# Patient Record
Sex: Male | Born: 1965 | Race: White | Hispanic: No | Marital: Married | Smoking: Never smoker
Health system: Southern US, Community
[De-identification: ages and names within clinical notes are randomized; demographics above are authoritative.]

## PROBLEM LIST (undated history)

## (undated) DIAGNOSIS — IMO0002 Reserved for concepts with insufficient information to code with codable children: Secondary | ICD-10-CM

## (undated) DIAGNOSIS — G4700 Insomnia, unspecified: Secondary | ICD-10-CM

## (undated) DIAGNOSIS — F419 Anxiety disorder, unspecified: Secondary | ICD-10-CM

## (undated) DIAGNOSIS — R0981 Nasal congestion: Secondary | ICD-10-CM

## (undated) DIAGNOSIS — E785 Hyperlipidemia, unspecified: Secondary | ICD-10-CM

## (undated) DIAGNOSIS — G8929 Other chronic pain: Secondary | ICD-10-CM

## (undated) DIAGNOSIS — I10 Essential (primary) hypertension: Secondary | ICD-10-CM

## (undated) HISTORY — DX: Insomnia, unspecified: G47.00

## (undated) HISTORY — DX: Hyperlipidemia, unspecified: E78.5

## (undated) HISTORY — PX: OTHER SURGICAL HISTORY: SHX169

---

## 2002-06-28 HISTORY — PX: CERVICAL FUSION: SHX112

## 2003-01-18 ENCOUNTER — Encounter: Payer: Self-pay | Admitting: Neurosurgery

## 2003-01-18 ENCOUNTER — Ambulatory Visit (HOSPITAL_COMMUNITY): Admission: RE | Admit: 2003-01-18 | Discharge: 2003-01-19 | Payer: Self-pay | Admitting: Neurosurgery

## 2004-10-31 ENCOUNTER — Inpatient Hospital Stay: Payer: Self-pay | Admitting: Internal Medicine

## 2004-10-31 ENCOUNTER — Other Ambulatory Visit: Payer: Self-pay

## 2004-11-04 ENCOUNTER — Ambulatory Visit: Payer: Self-pay | Admitting: General Surgery

## 2005-03-08 ENCOUNTER — Emergency Department: Payer: Self-pay | Admitting: Emergency Medicine

## 2005-03-08 ENCOUNTER — Other Ambulatory Visit: Payer: Self-pay

## 2005-06-28 HISTORY — PX: CHOLECYSTECTOMY: SHX55

## 2013-01-11 ENCOUNTER — Other Ambulatory Visit: Payer: Self-pay | Admitting: Orthopedic Surgery

## 2013-01-22 ENCOUNTER — Encounter (HOSPITAL_BASED_OUTPATIENT_CLINIC_OR_DEPARTMENT_OTHER): Payer: Self-pay | Admitting: *Deleted

## 2013-01-22 NOTE — Progress Notes (Signed)
Pt to come in for bmet-ekg-had ekg with cerv fusion 04 He is very hoarse, but states this is normal from sinus drainage. Uses afrin Never smoker-

## 2013-01-25 ENCOUNTER — Encounter (HOSPITAL_BASED_OUTPATIENT_CLINIC_OR_DEPARTMENT_OTHER)
Admission: RE | Admit: 2013-01-25 | Discharge: 2013-01-25 | Disposition: A | Discharge status: Home / Self Care | Payer: Self-pay | Source: Ambulatory Visit | Attending: Orthopedic Surgery | Admitting: Orthopedic Surgery

## 2013-01-25 ENCOUNTER — Other Ambulatory Visit: Payer: Self-pay

## 2013-01-25 DIAGNOSIS — I10 Essential (primary) hypertension: Secondary | ICD-10-CM | POA: Insufficient documentation

## 2013-01-25 DIAGNOSIS — Z01810 Encounter for preprocedural cardiovascular examination: Secondary | ICD-10-CM | POA: Insufficient documentation

## 2013-01-25 DIAGNOSIS — Z01812 Encounter for preprocedural laboratory examination: Secondary | ICD-10-CM | POA: Insufficient documentation

## 2013-01-25 LAB — BASIC METABOLIC PANEL
CO2: 25 mEq/L (ref 19–32)
Chloride: 104 mEq/L (ref 96–112)
Creatinine, Ser: 1 mg/dL (ref 0.50–1.35)
Glucose, Bld: 153 mg/dL — ABNORMAL HIGH (ref 70–99)

## 2013-01-25 NOTE — Progress Notes (Signed)
EKG reviewed by Dr. Justin Mend- ok for surgery here

## 2013-01-29 ENCOUNTER — Ambulatory Visit (HOSPITAL_BASED_OUTPATIENT_CLINIC_OR_DEPARTMENT_OTHER)
Admission: RE | Admit: 2013-01-29 | Discharge: 2013-01-29 | Disposition: A | Discharge status: Home / Self Care | Payer: Worker's Compensation | Source: Ambulatory Visit | Attending: Orthopedic Surgery | Admitting: Orthopedic Surgery

## 2013-01-29 ENCOUNTER — Encounter (HOSPITAL_BASED_OUTPATIENT_CLINIC_OR_DEPARTMENT_OTHER): Payer: Self-pay

## 2013-01-29 ENCOUNTER — Encounter (HOSPITAL_BASED_OUTPATIENT_CLINIC_OR_DEPARTMENT_OTHER)
Admission: RE | Disposition: A | Discharge status: Home / Self Care | Payer: Self-pay | Source: Ambulatory Visit | Attending: Orthopedic Surgery

## 2013-01-29 ENCOUNTER — Encounter (HOSPITAL_BASED_OUTPATIENT_CLINIC_OR_DEPARTMENT_OTHER): Payer: Self-pay | Admitting: Anesthesiology

## 2013-01-29 ENCOUNTER — Ambulatory Visit (HOSPITAL_BASED_OUTPATIENT_CLINIC_OR_DEPARTMENT_OTHER): Payer: Worker's Compensation | Admitting: Anesthesiology

## 2013-01-29 DIAGNOSIS — Z981 Arthrodesis status: Secondary | ICD-10-CM | POA: Insufficient documentation

## 2013-01-29 DIAGNOSIS — Z91038 Other insect allergy status: Secondary | ICD-10-CM | POA: Insufficient documentation

## 2013-01-29 DIAGNOSIS — M19019 Primary osteoarthritis, unspecified shoulder: Secondary | ICD-10-CM | POA: Insufficient documentation

## 2013-01-29 DIAGNOSIS — I10 Essential (primary) hypertension: Secondary | ICD-10-CM | POA: Insufficient documentation

## 2013-01-29 DIAGNOSIS — Z9889 Other specified postprocedural states: Secondary | ICD-10-CM

## 2013-01-29 DIAGNOSIS — X58XXXA Exposure to other specified factors, initial encounter: Secondary | ICD-10-CM | POA: Insufficient documentation

## 2013-01-29 DIAGNOSIS — IMO0002 Reserved for concepts with insufficient information to code with codable children: Secondary | ICD-10-CM | POA: Insufficient documentation

## 2013-01-29 DIAGNOSIS — Y990 Civilian activity done for income or pay: Secondary | ICD-10-CM | POA: Insufficient documentation

## 2013-01-29 DIAGNOSIS — H6690 Otitis media, unspecified, unspecified ear: Secondary | ICD-10-CM | POA: Insufficient documentation

## 2013-01-29 DIAGNOSIS — J3489 Other specified disorders of nose and nasal sinuses: Secondary | ICD-10-CM | POA: Insufficient documentation

## 2013-01-29 DIAGNOSIS — F411 Generalized anxiety disorder: Secondary | ICD-10-CM | POA: Insufficient documentation

## 2013-01-29 DIAGNOSIS — Z79899 Other long term (current) drug therapy: Secondary | ICD-10-CM | POA: Insufficient documentation

## 2013-01-29 DIAGNOSIS — Y9269 Other specified industrial and construction area as the place of occurrence of the external cause: Secondary | ICD-10-CM | POA: Insufficient documentation

## 2013-01-29 DIAGNOSIS — M25819 Other specified joint disorders, unspecified shoulder: Secondary | ICD-10-CM | POA: Insufficient documentation

## 2013-01-29 DIAGNOSIS — S43429A Sprain of unspecified rotator cuff capsule, initial encounter: Secondary | ICD-10-CM | POA: Insufficient documentation

## 2013-01-29 HISTORY — DX: Essential (primary) hypertension: I10

## 2013-01-29 HISTORY — DX: Nasal congestion: R09.81

## 2013-01-29 HISTORY — DX: Reserved for concepts with insufficient information to code with codable children: IMO0002

## 2013-01-29 HISTORY — DX: Other chronic pain: G89.29

## 2013-01-29 HISTORY — PX: SHOULDER ARTHROSCOPY WITH ROTATOR CUFF REPAIR AND SUBACROMIAL DECOMPRESSION: SHX5686

## 2013-01-29 HISTORY — DX: Anxiety disorder, unspecified: F41.9

## 2013-01-29 SURGERY — SHOULDER ARTHROSCOPY WITH ROTATOR CUFF REPAIR AND SUBACROMIAL DECOMPRESSION
Anesthesia: General | Site: Shoulder | Laterality: Left | Wound class: Clean

## 2013-01-29 MED ORDER — LACTATED RINGERS IV SOLN
INTRAVENOUS | Status: DC
  Administered 2013-01-29 (×2): via INTRAVENOUS
  Administered 2013-01-29: 10 mL/h via INTRAVENOUS

## 2013-01-29 MED ORDER — SUCCINYLCHOLINE CHLORIDE 20 MG/ML IJ SOLN
INTRAMUSCULAR | Status: DC | PRN
  Administered 2013-01-29: 100 mg via INTRAVENOUS

## 2013-01-29 MED ORDER — POVIDONE-IODINE 7.5 % EX SOLN: Freq: Once | CUTANEOUS | Status: DC

## 2013-01-29 MED ORDER — OXYCODONE-ACETAMINOPHEN 10-325 MG PO TABS: 1.0000 | ORAL_TABLET | ORAL | Status: DC | PRN

## 2013-01-29 MED ORDER — ONDANSETRON HCL 4 MG/2ML IJ SOLN: 4.0000 mg | Freq: Once | INTRAMUSCULAR | Status: DC | PRN

## 2013-01-29 MED ORDER — FENTANYL CITRATE 0.05 MG/ML IJ SOLN
INTRAMUSCULAR | Status: DC | PRN
  Administered 2013-01-29 (×2): 25 ug via INTRAVENOUS

## 2013-01-29 MED ORDER — OXYCODONE HCL 5 MG/5ML PO SOLN: 5.0000 mg | Freq: Once | ORAL | Status: AC | PRN

## 2013-01-29 MED ORDER — CEFAZOLIN SODIUM-DEXTROSE 2-3 GM-% IV SOLR
2.0000 g | INTRAVENOUS | Status: AC
  Administered 2013-01-29: 2 g via INTRAVENOUS

## 2013-01-29 MED ORDER — OXYCODONE-ACETAMINOPHEN 10-325 MG PO TABS: 1.0000 | ORAL_TABLET | Freq: Four times a day (QID) | ORAL | Status: DC | PRN

## 2013-01-29 MED ORDER — OXYCODONE HCL 5 MG PO TABS: ORAL_TABLET | ORAL | Status: DC

## 2013-01-29 MED ORDER — LIDOCAINE HCL (CARDIAC) 10 MG/ML IV SOLN
INTRAVENOUS | Status: DC | PRN
  Administered 2013-01-29: 60 mg via INTRAVENOUS

## 2013-01-29 MED ORDER — HYDROMORPHONE HCL PF 1 MG/ML IJ SOLN: 0.2500 mg | INTRAMUSCULAR | Status: DC | PRN

## 2013-01-29 MED ORDER — PROPOFOL 10 MG/ML IV BOLUS
INTRAVENOUS | Status: DC | PRN
  Administered 2013-01-29: 250 mg via INTRAVENOUS

## 2013-01-29 MED ORDER — SODIUM CHLORIDE 0.9 % IR SOLN
Status: DC | PRN
  Administered 2013-01-29: 15000 mL

## 2013-01-29 MED ORDER — DEXAMETHASONE SODIUM PHOSPHATE 4 MG/ML IJ SOLN
INTRAMUSCULAR | Status: DC | PRN
  Administered 2013-01-29: 4 mg
  Administered 2013-01-29: 10 mg via INTRAVENOUS

## 2013-01-29 MED ORDER — MIDAZOLAM HCL 2 MG/2ML IJ SOLN
1.0000 mg | INTRAMUSCULAR | Status: DC | PRN
  Administered 2013-01-29: 2 mg via INTRAVENOUS

## 2013-01-29 MED ORDER — ONDANSETRON HCL 4 MG/2ML IJ SOLN
INTRAMUSCULAR | Status: DC | PRN
  Administered 2013-01-29: 4 mg via INTRAVENOUS

## 2013-01-29 MED ORDER — OXYCODONE HCL 5 MG PO TABS
5.0000 mg | ORAL_TABLET | Freq: Once | ORAL | Status: AC | PRN
  Administered 2013-01-29: 5 mg via ORAL

## 2013-01-29 MED ORDER — FENTANYL CITRATE 0.05 MG/ML IJ SOLN
50.0000 ug | INTRAMUSCULAR | Status: DC | PRN
  Administered 2013-01-29: 200 ug via INTRAVENOUS

## 2013-01-29 MED ORDER — SCOPOLAMINE 1 MG/3DAYS TD PT72
1.0000 | MEDICATED_PATCH | TRANSDERMAL | Status: DC
Start: 2013-01-29 — End: 2013-01-29
  Administered 2013-01-29: 1.5 mg via TRANSDERMAL

## 2013-01-29 MED ORDER — BUPIVACAINE-EPINEPHRINE PF 0.5-1:200000 % IJ SOLN
INTRAMUSCULAR | Status: DC | PRN
  Administered 2013-01-29: 20 mL

## 2013-01-29 SURGICAL SUPPLY — 82 items
ANCHOR CORKSCREW FIBER 5.5X15 (Anchor) ×2 IMPLANT
ANCHOR PEEK 4.75X19.1 SWLK C (Anchor) ×2 IMPLANT
BENZOIN TINCTURE PRP APPL 2/3 (GAUZE/BANDAGES/DRESSINGS) IMPLANT
BLADE SURG 15 STRL LF DISP TIS (BLADE) IMPLANT
BLADE SURG 15 STRL SS (BLADE)
BLADE SURG ROTATE 9660 (MISCELLANEOUS) IMPLANT
BUR OVAL 4.0 (BURR) ×2 IMPLANT
CANISTER OMNI JUG 16 LITER (MISCELLANEOUS) ×2 IMPLANT
CANISTER SUCTION 2500CC (MISCELLANEOUS) IMPLANT
CANNULA 5.75X71 LONG (CANNULA) ×2 IMPLANT
CANNULA TWIST IN 8.25X7CM (CANNULA) ×2 IMPLANT
CHLORAPREP W/TINT 26ML (MISCELLANEOUS) ×2 IMPLANT
CLOTH BEACON ORANGE TIMEOUT ST (SAFETY) ×2 IMPLANT
DECANTER SPIKE VIAL GLASS SM (MISCELLANEOUS) IMPLANT
DERMABOND ADVANCED (GAUZE/BANDAGES/DRESSINGS)
DERMABOND ADVANCED .7 DNX12 (GAUZE/BANDAGES/DRESSINGS) IMPLANT
DRAPE INCISE IOBAN 66X45 STRL (DRAPES) ×2 IMPLANT
DRAPE STERI 35X30 U-POUCH (DRAPES) ×2 IMPLANT
DRAPE SURG 17X23 STRL (DRAPES) ×2 IMPLANT
DRAPE U 20/CS (DRAPES) ×2 IMPLANT
DRAPE U-SHAPE 47X51 STRL (DRAPES) ×2 IMPLANT
DRAPE U-SHAPE 76X120 STRL (DRAPES) ×4 IMPLANT
DRSG PAD ABDOMINAL 8X10 ST (GAUZE/BANDAGES/DRESSINGS) ×2 IMPLANT
ELECT REM PT RETURN 9FT ADLT (ELECTROSURGICAL) ×2
ELECTRODE REM PT RTRN 9FT ADLT (ELECTROSURGICAL) ×1 IMPLANT
GAUZE SPONGE 4X4 16PLY XRAY LF (GAUZE/BANDAGES/DRESSINGS) IMPLANT
GAUZE XEROFORM 1X8 LF (GAUZE/BANDAGES/DRESSINGS) ×2 IMPLANT
GLOVE BIO SURGEON STRL SZ7 (GLOVE) ×2 IMPLANT
GLOVE BIO SURGEON STRL SZ7.5 (GLOVE) ×2 IMPLANT
GLOVE BIOGEL PI IND STRL 7.0 (GLOVE) ×2 IMPLANT
GLOVE BIOGEL PI IND STRL 8 (GLOVE) IMPLANT
GLOVE BIOGEL PI INDICATOR 7.0 (GLOVE) ×2
GLOVE BIOGEL PI INDICATOR 8 (GLOVE)
GLOVE ECLIPSE 6.5 STRL STRAW (GLOVE) ×2 IMPLANT
GOWN PREVENTION PLUS XLARGE (GOWN DISPOSABLE) ×4 IMPLANT
GOWN PREVENTION PLUS XXLARGE (GOWN DISPOSABLE) ×2 IMPLANT
LASSO CRESCENT QUICKPASS (SUTURE) ×2 IMPLANT
NDL SUT 6 .5 CRC .975X.05 MAYO (NEEDLE) IMPLANT
NEEDLE 1/2 CIR CATGUT .05X1.09 (NEEDLE) IMPLANT
NEEDLE MAYO TAPER (NEEDLE)
NEEDLE SCORPION MULTI FIRE (NEEDLE) ×2 IMPLANT
NS IRRIG 1000ML POUR BTL (IV SOLUTION) IMPLANT
PACK ARTHROSCOPY DSU (CUSTOM PROCEDURE TRAY) ×2 IMPLANT
PACK BASIN DAY SURGERY FS (CUSTOM PROCEDURE TRAY) ×2 IMPLANT
PENCIL BUTTON HOLSTER BLD 10FT (ELECTRODE) IMPLANT
RESECTOR FULL RADIUS 4.2MM (BLADE) ×2 IMPLANT
SHEET MEDIUM DRAPE 40X70 STRL (DRAPES) ×2 IMPLANT
SLEEVE SCD COMPRESS KNEE MED (MISCELLANEOUS) ×2 IMPLANT
SLING ARM FOAM STRAP LRG (SOFTGOODS) ×2 IMPLANT
SLING ARM FOAM STRAP MED (SOFTGOODS) IMPLANT
SLING ARM FOAM STRAP XLG (SOFTGOODS) IMPLANT
SLING ARM IMMOBILIZER MED (SOFTGOODS) IMPLANT
SPONGE GAUZE 4X4 12PLY (GAUZE/BANDAGES/DRESSINGS) ×2 IMPLANT
SPONGE LAP 4X18 X RAY DECT (DISPOSABLE) IMPLANT
STRIP CLOSURE SKIN 1/2X4 (GAUZE/BANDAGES/DRESSINGS) IMPLANT
SUCTION FRAZIER TIP 10 FR DISP (SUCTIONS) IMPLANT
SUPPORT WRAP ARM LG (MISCELLANEOUS) ×2 IMPLANT
SUT BONE WAX W31G (SUTURE) IMPLANT
SUT ETHIBOND 2 OS 4 DA (SUTURE) IMPLANT
SUT ETHILON 3 0 PS 1 (SUTURE) ×2 IMPLANT
SUT ETHILON 4 0 PS 2 18 (SUTURE) IMPLANT
SUT FIBERWIRE #2 38 T-5 BLUE (SUTURE)
SUT FIBERWIRE 2-0 18 17.9 3/8 (SUTURE)
SUT MNCRL AB 3-0 PS2 18 (SUTURE) IMPLANT
SUT MNCRL AB 4-0 PS2 18 (SUTURE) IMPLANT
SUT PDS AB 0 CT 36 (SUTURE) ×2 IMPLANT
SUT PROLENE 3 0 PS 2 (SUTURE) IMPLANT
SUT VIC AB 0 CT1 27 (SUTURE)
SUT VIC AB 0 CT1 27XBRD ANBCTR (SUTURE) IMPLANT
SUT VIC AB 2-0 SH 27 (SUTURE)
SUT VIC AB 2-0 SH 27XBRD (SUTURE) IMPLANT
SUTURE FIBERWR #2 38 T-5 BLUE (SUTURE) IMPLANT
SUTURE FIBERWR 2-0 18 17.9 3/8 (SUTURE) IMPLANT
SYR BULB 3OZ (MISCELLANEOUS) IMPLANT
TAPE FIBER 2MM 7IN #2 BLUE (SUTURE) ×2 IMPLANT
TOWEL OR 17X24 6PK STRL BLUE (TOWEL DISPOSABLE) ×2 IMPLANT
TOWEL OR NON WOVEN STRL DISP B (DISPOSABLE) ×2 IMPLANT
TUBE CONNECTING 20X1/4 (TUBING) ×2 IMPLANT
TUBING ARTHROSCOPY IRRIG 16FT (MISCELLANEOUS) ×2 IMPLANT
WAND STAR VAC 90 (SURGICAL WAND) ×2 IMPLANT
WATER STERILE IRR 1000ML POUR (IV SOLUTION) ×2 IMPLANT
YANKAUER SUCT BULB TIP NO VENT (SUCTIONS) IMPLANT

## 2013-01-29 NOTE — Anesthesia Preprocedure Evaluation (Signed)
Anesthesia Evaluation  Patient identified by MRN, date of birth, ID band Patient awake    Reviewed: Allergy & Precautions, H&P , NPO status , Patient's Chart, lab work & pertinent test results  Airway Mallampati: I TM Distance: >3 FB Neck ROM: Full    Dental  (+) Teeth Intact and Dental Advisory Given   Pulmonary  breath sounds clear to auscultation        Cardiovascular hypertension, Pt. on medications Rhythm:Regular Rate:Normal     Neuro/Psych    GI/Hepatic   Endo/Other    Renal/GU      Musculoskeletal   Abdominal   Peds  Hematology   Anesthesia Other Findings   Reproductive/Obstetrics                           Anesthesia Physical Anesthesia Plan  ASA: II  Anesthesia Plan: General   Post-op Pain Management:    Induction: Intravenous  Airway Management Planned: Oral ETT  Additional Equipment:   Intra-op Plan:   Post-operative Plan: Extubation in OR  Informed Consent: I have reviewed the patients History and Physical, chart, labs and discussed the procedure including the risks, benefits and alternatives for the proposed anesthesia with the patient or authorized representative who has indicated his/her understanding and acceptance.   Dental advisory given  Plan Discussed with: CRNA, Anesthesiologist and Surgeon  Anesthesia Plan Comments:         Anesthesia Quick Evaluation  

## 2013-01-29 NOTE — Progress Notes (Signed)
Assisted Dr. Crews with left, ultrasound guided, interscalene  block. Side rails up, monitors on throughout procedure. See vital signs in flow sheet. Tolerated Procedure well. 

## 2013-01-29 NOTE — Anesthesia Procedure Notes (Addendum)
Anesthesia Procedure Image  Anesthesia Regional Block:  Interscalene brachial plexus block  Pre-Anesthetic Checklist: ,, timeout performed, Correct Patient, Correct Site, Correct Laterality, Correct Procedure, Correct Position, site marked, Risks and benefits discussed,  Surgical consent,  Pre-op evaluation,  At surgeon's request and post-op pain management  Laterality: Left and Upper  Prep: chloraprep       Needles:  Injection technique: Single-shot  Needle Type: Echogenic Needle     Needle Length: 5cm 5 cm Needle Gauge: 21    Additional Needles:  Procedures: ultrasound guided (picture in chart) Interscalene brachial plexus block Narrative:  Start time: 01/29/2013 10:42 AM End time: 01/29/2013 11:50 AM Injection made incrementally with aspirations every 5 mL.  Performed by: Personally  Anesthesiologist: Sheldon Silvan, MD  Interscalene brachial plexus block Anesthesia Procedure Image  Anesthesia Procedure Image Anesthesia Procedure Image  Anesthesia Procedure Image Anesthesia Procedure Image  Anesthesia Procedure Image  Anesthesia Procedure Image Anesthesia Procedure Image  Anesthesia Procedure Image  Anesthesia Procedure Image Anesthesia Procedure Image  Anesthesia Procedure Image  Anesthesia Procedure Image  Procedure Name: Intubation Date/Time: 01/29/2013 11:47 AM Performed by: Gar Gibbon Pre-anesthesia Checklist: Patient identified, Emergency Drugs available, Suction available and Patient being monitored Patient Re-evaluated:Patient Re-evaluated prior to inductionOxygen Delivery Method: Circle System Utilized Preoxygenation: Pre-oxygenation with 100% oxygen Intubation Type: IV induction Ventilation: Mask ventilation without difficulty Grade View: Grade III Tube type: Oral Tube size: 8.0 mm Number of attempts: 1 Airway Equipment and Method: stylet,  oral airway and Video-laryngoscopy Placement Confirmation: positive ETCO2 and breath sounds checked-  equal and bilateral Secured at: 21 cm Tube secured with: Tape Dental Injury: Teeth and Oropharynx as per pre-operative assessment

## 2013-01-29 NOTE — Transfer of Care (Signed)
Immediate Anesthesia Transfer of Care Note  Patient: Brett Huber  Procedure(s) Performed: Procedure(s): LEFT SHOULDER ARTHROSCOPY WITH ROTATOR CUFF REPAIR AND SUBACROMIAL DECOMPRESSION DISTAL CLAVICLE RESECTION (Left)  Patient Location: PACU  Anesthesia Type:GA combined with regional for post-op pain  Level of Consciousness: sedated and patient cooperative  Airway & Oxygen Therapy: Patient Spontanous Breathing and Patient connected to face mask oxygen  Post-op Assessment: Report given to PACU RN and Post -op Vital signs reviewed and stable  Post vital signs: Reviewed and stable  Complications: No apparent anesthesia complications

## 2013-01-29 NOTE — Op Note (Signed)
Procedure(s): LEFT SHOULDER ARTHROSCOPY WITH ROTATOR CUFF REPAIR AND SUBACROMIAL DECOMPRESSION DISTAL CLAVICLE RESECTION Procedure Note  LOUISE VICTORY male 47 y.o. 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   Surgeon(s) and Role:    * Mable Paris, MD - Primary     Surgeon: Mable Paris   Assistants: Damita Lack PA-C (Danielle was present and scrubbed throughout the procedure and was essential in positioning, assisting with the camera and instrumentation,, and closure)  Anesthesia: General endotracheal anesthesia with preoperative interscalene block    Procedure Detail  LEFT SHOULDER ARTHROSCOPY WITH ROTATOR CUFF REPAIR AND SUBACROMIAL DECOMPRESSION DISTAL CLAVICLE RESECTION  Estimated Blood Loss: Min         Drains: none  Blood Given: none         Specimens: none        Complications:  * No complications entered in OR log *         Disposition: PACU - hemodynamically stable.         Condition: stable    Procedure:   INDICATIONS FOR SURGERY: The patient is 47 y.o. male who had an injury at work 09/12/2012. He failed nonoperative management. An MRI showed high-grade partial-thickness tears. Is indicated for surgical treatment to decrease pain and restore function. He understood risks benefits and alternatives to the procedure including but not limited to risk of stiffness, nonhealing, incomplete pain relief, and the potential need for future surgery.  OPERATIVE FINDINGS: Examination under anesthesia: No stiffness or instability Diagnostic  Arthroscopy:  Glenoid articular cartilage: Intact Humeral head articular cartilage: Intact Labrum: Intact, mild partial tearing of the long head biceps tendon which was debrided. The remainder the tendon looked intact. Loose bodies: None Synovitis: Mild Articular sided rotator cuff: Small full-thickness tear anteriorly Bursal sided rotator cuff: Small full-thickness tear anteriorly Coracoacromial ligament: Severely frayed indicating chronic impingement  DESCRIPTION OF PROCEDURE: The patient was identified in preoperative  holding area where I personally marked the operative site after  verifying site, side, and procedure with the patient. An interscalene block was given by the attending anesthesiologist the holding area.  The patient was taken back to the operating room where general anesthesia was induced without complication and was placed in the beach-chair position with the back  elevated about 60 degrees and all extremities and head and neck carefully padded and  positioned.   The left upper extremity was then prepped and  draped in a standard sterile fashion. The appropriate time-out  procedure was carried out. The patient did receive IV antibiotics  within 30 minutes of incision.   A small posterior portal incision was made and the arthroscope was introduced into the joint. An anterior portal was then established above the subscapularis using needle localization. Small cannula was placed anteriorly. Diagnostic arthroscopy was then carried out with findings as described above.  The biceps tendon was noted to have some partial-thickness tearing. It was pulled into the joint. The tearing is felt to involve less than 10% of the tendon thickness the remainder the tendon looked intact without any significant tenosynovitis. Therefore the shaver was used to debride this. The remainder the intra-articular joint structures look fairly good with the cartilage to be  intact reversible labrum  intact.  The subscapularis was noted to be torn with the superior border retracted medially. A grasper was used to reduce the good thick healthy tendon back to the tuberosity and was easily reducible. Therefore it was felt the repair should be carried out. A large cannula was placed anteriorly with a small cannula placed in the high lateral rotator interval portal position on the biceps. Once the tendon was freed up and mobilized the superior aspect of the lesser tuberosity was debrided down to bleeding bone with the bur. A crescent suture passer was used to pass one fiber tape in an inverted mattress configuration through the upper border of the subscapularis. This was placed in a 4.75 mm swivel lock anchor which was then used to repair the subscapularis back to the lesser tuberosity. The repair was felt to be excellent and reproduced the normal anatomy of the subscapularis. The biceps tendon was not subluxated was left in its normal position.  The supraspinatus was then examined. There is noted to be high grade undersurface tearing. Given the small size of the tear is difficult to tell if it penetrated full-thickness. The undersurface was debrided with the shaver.  The arthroscope was then introduced into the subacromial space a standard lateral portal was established with needle localization. The shaver was used through the lateral portal to perform extensive bursectomy. Coracoacromial ligament was examined and found to be severely frayed indicating chronic impingement.  The bursal surface of the rotator cuff was carefully examined and in the region of the anterior supraspinatus there was a small full-thickness tear which communicated with the articular sided tear previously visualized. Therefore a working cannula was placed laterally and the tendon edge was debrided with a shaver. The tuberosity was prepared with a bur to bleeding bone and repair was carried out in a simple fashion with one 5.5 mm peak  corkscrew anchor centered in the tuberosity. 2 simple sutures were passed with one anterior one posterior. These were used to bring the tendon down over the prepared tuberosity.   The coracoacromial ligament was taken down off the anterior acromion with the ArthroCare exposing a hooked anterior acromial spur. A high-speed bur was then used through the lateral portal to take down the anterior acromial spur from lateral to medial in a standard acromioplasty.  The acromioplasty was also viewed from the lateral portal and the bur was used as necessary to ensure that the acromion was completely flat from posterior to anterior.  The distal clavicle was exposed arthroscopically and the bur was used to take off the undersurface for approximately 8 mm from the lateral portal. The bur was then moved to an anterior portal position to complete the distal clavicle excision resecting about 8 mm of the distal clavicle and a smooth even fashion. Upon viewing from the front superior aspect of the clavicle was not adequately resected. It was very difficult to adequately view this from the lateral portal given his anatomy. Therefore a new visor portal was created while viewing from the anterior portal into the a.c. joint. This allowed direct as well as a shunt of the resection which was then completed with a bur.  The arthroscopic equipment was removed from the joint and the portals were closed with 3-0 nylon in an interrupted fashion. Sterile dressings were then applied including Xeroform 4 x 4's ABDs and tape. The patient was then allowed to awaken from general anesthesia, placed in a sling, transferred to the stretcher and taken to the  recovery room in stable condition.   POSTOPERATIVE PLAN: The patient will be discharged home today and will followup in one week for suture removal and wound check.  He will follow the standard cuff protocol.

## 2013-01-29 NOTE — H&P (Signed)
Brett Huber is an 47 y.o. male.   Chief Complaint: L shoulder pain HPI: s/p injury at work with continued L shoulder pain and dysfunction despite conservative treatment.  Past Medical History  Diagnosis Date  . Hypertension   . Anxiety   . Chronic pain   . DDD (degenerative disc disease)   . Sinus congestion     Past Surgical History  Procedure Laterality Date  . Cholecystectomy  2007  . Cervical fusion  2004    History reviewed. No pertinent family history. Social History:  reports that he has never smoked. He does not have any smokeless tobacco history on file. He reports that  drinks alcohol. He reports that he does not use illicit drugs.  Allergies:  Allergies  Allergen Reactions  . Bee Venom Shortness Of Breath    Medications Prior to Admission  Medication Sig Dispense Refill  . alprazolam (XANAX) 2 MG tablet Take 2 mg by mouth at bedtime as needed for sleep.      Marland Kitchen gabapentin (NEURONTIN) 300 MG capsule Take 300 mg by mouth 3 (three) times daily.      Marland Kitchen olmesartan-hydrochlorothiazide (BENICAR HCT) 20-12.5 MG per tablet Take 1 tablet by mouth daily.      Marland Kitchen oxyCODONE-acetaminophen (PERCOCET) 10-325 MG per tablet Take 1 tablet by mouth every 4 (four) hours as needed for pain (takes about 6 daily).      Marland Kitchen oxymetazoline (AFRIN) 0.05 % nasal spray Place 2 sprays into the nose 2 (two) times daily.        Results for orders placed during the hospital encounter of 01/29/13 (from the past 48 hour(s))  POCT HEMOGLOBIN-HEMACUE     Status: None   Collection Time    01/29/13 10:26 AM      Result Value Range   Hemoglobin 15.5  13.0 - 17.0 g/dL   No results found.  Review of Systems  All other systems reviewed and are negative.    Blood pressure 122/92, pulse 87, temperature 98.3 F (36.8 C), temperature source Oral, resp. rate 18, height 5\' 9"  (1.753 m), weight 80.797 kg (178 lb 2 oz), SpO2 100.00%. Physical Exam  Constitutional: He is oriented to person, place,  and time. He appears well-developed and well-nourished.  HENT:  Head: Atraumatic.  Eyes: EOM are normal.  Cardiovascular: Intact distal pulses.   Respiratory: Effort normal.  Musculoskeletal:  L shoulder TTP at [facility] and RC  Neurological: He is alert and oriented to person, place, and time.  Skin: Skin is warm and dry.  Psychiatric: He has a normal mood and affect.     Assessment/Plan L shoulder partial RCT, impingement, AC DJD Plan arth RCR vs debridement, SAD, DCR Risks / benefits of surgery discussed Consent on chart  NPO for OR Preop antibiotics   Drevion Offord WILLIAM 01/29/2013, 11:28 AM

## 2013-01-29 NOTE — Anesthesia Postprocedure Evaluation (Signed)
  Anesthesia Post-op Note  Patient: Brett Huber  Procedure(s) Performed: Procedure(s): LEFT SHOULDER ARTHROSCOPY WITH ROTATOR CUFF REPAIR AND SUBACROMIAL DECOMPRESSION DISTAL CLAVICLE RESECTION (Left)  Patient Location: PACU  Anesthesia Type:GA combined with regional for post-op pain  Level of Consciousness: awake, alert  and oriented  Airway and Oxygen Therapy: Patient Spontanous Breathing  Post-op Pain: none  Post-op Assessment: Post-op Vital signs reviewed  Post-op Vital Signs: Reviewed  Complications: No apparent anesthesia complications

## 2013-01-31 ENCOUNTER — Encounter (HOSPITAL_BASED_OUTPATIENT_CLINIC_OR_DEPARTMENT_OTHER): Payer: Self-pay | Admitting: Orthopedic Surgery

## 2015-07-23 ENCOUNTER — Ambulatory Visit (INDEPENDENT_AMBULATORY_CARE_PROVIDER_SITE_OTHER): Payer: Managed Care, Other (non HMO) | Admitting: Family Medicine

## 2015-07-23 ENCOUNTER — Encounter: Payer: Self-pay | Admitting: Family Medicine

## 2015-07-23 VITALS — BP 160/94 | HR 96 | Temp 98.8°F | Resp 16 | Ht 69.0 in | Wt 185.0 lb

## 2015-07-23 DIAGNOSIS — I10 Essential (primary) hypertension: Secondary | ICD-10-CM | POA: Diagnosis not present

## 2015-07-23 DIAGNOSIS — G8929 Other chronic pain: Secondary | ICD-10-CM

## 2015-07-23 DIAGNOSIS — Z1211 Encounter for screening for malignant neoplasm of colon: Secondary | ICD-10-CM | POA: Diagnosis not present

## 2015-07-23 DIAGNOSIS — I129 Hypertensive chronic kidney disease with stage 1 through stage 4 chronic kidney disease, or unspecified chronic kidney disease: Secondary | ICD-10-CM | POA: Insufficient documentation

## 2015-07-23 DIAGNOSIS — F419 Anxiety disorder, unspecified: Secondary | ICD-10-CM | POA: Insufficient documentation

## 2015-07-23 DIAGNOSIS — F418 Other specified anxiety disorders: Secondary | ICD-10-CM

## 2015-07-23 DIAGNOSIS — N529 Male erectile dysfunction, unspecified: Secondary | ICD-10-CM

## 2015-07-23 DIAGNOSIS — E559 Vitamin D deficiency, unspecified: Secondary | ICD-10-CM

## 2015-07-23 DIAGNOSIS — E785 Hyperlipidemia, unspecified: Secondary | ICD-10-CM | POA: Insufficient documentation

## 2015-07-23 DIAGNOSIS — G4700 Insomnia, unspecified: Secondary | ICD-10-CM | POA: Diagnosis not present

## 2015-07-23 DIAGNOSIS — F5101 Primary insomnia: Secondary | ICD-10-CM | POA: Insufficient documentation

## 2015-07-23 DIAGNOSIS — F32A Depression, unspecified: Secondary | ICD-10-CM

## 2015-07-23 DIAGNOSIS — F329 Major depressive disorder, single episode, unspecified: Secondary | ICD-10-CM

## 2015-07-23 DIAGNOSIS — N182 Chronic kidney disease, stage 2 (mild): Secondary | ICD-10-CM | POA: Insufficient documentation

## 2015-07-23 MED ORDER — OLMESARTAN MEDOXOMIL-HCTZ 20-12.5 MG PO TABS: 1.0000 | ORAL_TABLET | Freq: Every day | ORAL | Status: DC

## 2015-07-23 MED ORDER — TADALAFIL 20 MG PO TABS: 10.0000 mg | ORAL_TABLET | ORAL | Status: DC | PRN

## 2015-07-23 MED ORDER — SERTRALINE HCL 50 MG PO TABS: 50.0000 mg | ORAL_TABLET | Freq: Every day | ORAL | Status: DC

## 2015-07-23 NOTE — Patient Instructions (Signed)
Let's try Zoloft for sleep and anxiety. Break the first 2 tablets in 1/2 and take 1/2 table for 4 days to reduce side effects.   Also look into Cognitive Behavioral Therapy for sleep: http://www.rodriguez-ball.com/ https://sleepfoundation.org/sleep-news/cognitive-behavioral-therapy-insomnia  Your goal blood pressure is 140/90. Work on low salt/sodium diet - goal <2.5gm (2,500mg ) per day. Eat a diet high in fruits/vegetables and whole grains.  Look into mediterranean and DASH diet. Goal activity is 156min/wk of moderate intensity exercise.  This can be split into 30 minute chunks.  If you are not at this level, you can start with smaller 10-15 min increments and slowly build up activity. Look at www.heart.org for more resources  Please seek immediate medical attention at ER or Urgent Care if you develop: Chest pain, pressure or tightness. Shortness of breath accompanied by nausea or diaphoresis Visual changes Numbness or tingling on one side of the body Facial droop Altered mental status Or any concerning symptoms.

## 2015-07-23 NOTE — Assessment & Plan Note (Signed)
PRN cialis refilled. Pt is aware he must tell EMS he has taken this medication if he experiences chest pain.

## 2015-07-23 NOTE — Assessment & Plan Note (Signed)
Check lipid panel. Consider statin therapy in the future.

## 2015-07-23 NOTE — Assessment & Plan Note (Addendum)
Score 17/27 on PHQ-9. Pt is amenable to starting medication. Start Zoloft to help with mood and sleep. Check TSH and Vitamin D.  Pt has dark thoughts but no active plan to hurt himself. Will go to ER if he feels he is a danger to himself.  Contract for safety made.  Recheck 1 mos.

## 2015-07-23 NOTE — Progress Notes (Signed)
Subjective:    Patient ID: Brett Huber, male    DOB: 06-23-1966, 50 y.o.   MRN: [id number]  HPI: Brett Huber is a 50 y.o. male presenting on 07/23/2015 for Establish Care   HPI  Pt presents to establish care today. Previous care provider was Dr. Audie Box.  It has been 1 year since his last PCP visit. Records from previous provider will be requested and reviewed. Also sees Pain Management in [place]- Dr. Rochele Raring Current medical problems include:  Degenerative disc disease: Sees pain management Insomnia: Has a long issues with no sleep. Taking unisom, melatonin with a little relief. Has tried trazodone. Has tried [place]- felt a zombie. He has trouble shutting his brain off. Under a lot of stress. His wife is in treatment for substance abuse. 2 small children at home. Works 3rd shift.Some dark thoughts but no active suicide plan. More an outlet for stress. Contract for safety made.  High blood pressure: Been on medications in the past- Benicar- off medication for a year.  Hyperlipidemia: Previously on medication. On simvastatin. Did well on medication. No CP, SOB, visual change. Erectile Dysfunction: Able to get but trouble maintaining erection. Has taken cialis with good effect in the past.   Health maintenance:  Last TDAP- 3 years ago. Never had a colonscopy- desires cologuard.    Past Medical History  Diagnosis Date  . Hypertension   . Anxiety   . Chronic pain   . DDD (degenerative disc disease)   . Sinus congestion   . Hyperlipidemia   . Insomnia    Social History   Social History  . Marital Status: Married    Spouse Name: N/A  . Number of Children: N/A  . Years of Education: N/A   Occupational History  . Not on file.   Social History Main Topics  . Smoking status: Never Smoker   . Smokeless tobacco: Current User    Types: Chew  . Alcohol Use: Yes     Comment: occ  . Drug Use: No  . Sexual Activity: Not on file   Other Topics Concern  . Not on  file   Social History Narrative   Family History  Problem Relation Age of Onset  . Diabetes Mother   . Stroke Father    Current Outpatient Prescriptions on File Prior to Visit  Medication Sig  . gabapentin (NEURONTIN) 300 MG capsule Take 300 mg by mouth 3 (three) times daily.  Marland Kitchen oxymetazoline (AFRIN) 0.05 % nasal spray Place 2 sprays into the nose 2 (two) times daily. Reported on 07/23/2015   No current facility-administered medications on file prior to visit.    Review of Systems  Constitutional: Negative for fever and chills.  HENT: Negative.   Respiratory: Negative for chest tightness, shortness of breath and wheezing.   Cardiovascular: Negative for chest pain, palpitations and leg swelling.  Gastrointestinal: Negative for nausea, vomiting and abdominal pain.  Endocrine: Negative.   Genitourinary: Negative for dysuria, urgency, discharge, penile pain and testicular pain.  Musculoskeletal: Positive for back pain. Negative for joint swelling and arthralgias.  Skin: Negative.   Neurological: Negative for dizziness, weakness, numbness and headaches.  Psychiatric/Behavioral: Positive for sleep disturbance, dysphoric mood and agitation. Negative for suicidal ideas. The patient is nervous/anxious.    Per HPI unless specifically indicated above     Objective:    BP 160/94 mmHg  Pulse 96  Temp(Src) 98.8 F (37.1 C) (Oral)  Resp 16  Ht  (1.753 m)  Wt 185 lb (83.915 kg)  BMI 27.31 kg/m2  Wt Readings from Last 3 Encounters:  07/23/15 185 lb (83.915 kg)  01/29/13 178 lb 2 oz (80.797 kg)    Depression screen [facility] 2/9 07/23/2015 07/23/2015  Decreased Interest 2 0  Down, Depressed, Hopeless 2 0  PHQ - 2 Score 4 0  Altered sleeping 3 -  Tired, decreased energy 3 -  Change in appetite 3 -  Feeling bad or failure about yourself  2 -  Trouble concentrating 1 -  Moving slowly or fidgety/restless 0 -  Suicidal thoughts 1 -  PHQ-9 Score 17 -  Difficult doing work/chores  Somewhat difficult -     Physical Exam  Constitutional: He is oriented to person, place, and time. He appears well-developed and well-nourished. No distress.  HENT:  Head: Normocephalic and atraumatic.  Neck: Neck supple. No thyromegaly present.  Cardiovascular: Normal rate, regular rhythm and normal heart sounds.  Exam reveals no gallop and no friction rub.   No murmur heard. Pulmonary/Chest: Effort normal and breath sounds normal. He has no wheezes.  Abdominal: Soft. Bowel sounds are normal. He exhibits no distension. There is no tenderness. There is no rebound.  Musculoskeletal: Normal range of motion. He exhibits no edema or tenderness.  Neurological: He is alert and oriented to person, place, and time. He has normal reflexes.  Skin: Skin is warm and dry. No rash noted. No erythema.  Psychiatric: His behavior is normal. He exhibits a depressed mood. He expresses suicidal (some thoughts he would be better off dead but no active plan to hurt himself. ) ideation. He expresses no suicidal plans.   Results for orders placed or performed during the hospital encounter of 01/29/13  Basic metabolic panel  Result Value Ref Range   Sodium 140 135 - 145 mEq/L   Potassium 4.2 3.5 - 5.1 mEq/L   Chloride 104 96 - 112 mEq/L   CO2 25 19 - 32 mEq/L   Glucose, Bld 153 (H) 70 - 99 mg/dL   BUN 23 6 - 23 mg/dL   Creatinine, Ser 1.61 0.50 - 1.35 mg/dL   Calcium 9.9 8.4 - 09.6 mg/dL   GFR calc non Af Amer 87 (L) >90 mL/min   GFR calc Af Amer >90 >90 mL/min  Hemoglobin-hemacue, POC  Result Value Ref Range   Hemoglobin 15.5 13.0 - 17.0 g/dL      Assessment & Plan:   Problem List Items Addressed This Visit      Cardiovascular and Mediastinum   Hypertension - Primary    Restart Benicar at previous dose. Recheck BP in 2 week. CMET today.  Encouraged DASH diet and exercise daily.       Relevant Medications   olmesartan-hydrochlorothiazide (BENICAR HCT) 20-12.5 MG tablet   tadalafil (CIALIS) 20  MG tablet   Other Relevant Orders   Comprehensive Metabolic Panel (CMET)     Genitourinary   Erectile dysfunction    PRN cialis refilled. Pt is aware he must tell EMS he has taken this medication if he experiences chest pain.       Relevant Medications   tadalafil (CIALIS) 20 MG tablet     Other   Hyperlipidemia    Check lipid panel. Consider statin therapy in the future.       Relevant Medications   olmesartan-hydrochlorothiazide (BENICAR HCT) 20-12.5 MG tablet   tadalafil (CIALIS) 20 MG tablet   Other Relevant Orders   Lipid Profile   Anxiety and depression  Score 17/27 on PHQ-9. Pt is amenable to starting medication. Start Zoloft to help with mood and sleep. Check TSH and Vitamin D.  Pt has dark thoughts but no active plan to hurt himself. Will go to ER if he feels he is a danger to himself.  Contract for safety made.  Recheck 1 mos.       Relevant Medications   sertraline (ZOLOFT) 50 MG tablet   Other Relevant Orders   TSH   Chronic pain    Continue to follow with Dr. Regenia Skeeter.        Relevant Medications   oxyCODONE (ROXICODONE) 15 MG immediate release tablet   sertraline (ZOLOFT) 50 MG tablet   Insomnia    Sleep issues are likely anxiety related. Continue unisom and melatonin. Discussed CBT for sleep as a possibility.       Vitamin D deficiency    On medication in the past. Recheck Vitamin D today.       Relevant Orders   VITAMIN D 25 Hydroxy (Vit-D Deficiency, Fractures)    Other Visit Diagnoses    Screening for colon cancer        Relevant Orders    Cologuard       Meds ordered this encounter  Medications  . oxyCODONE (ROXICODONE) 15 MG immediate release tablet    Sig: TK 1 T PO QID AND 1 T QHS PRN    Refill:  0  . sertraline (ZOLOFT) 50 MG tablet    Sig: Take 1 tablet (50 mg total) by mouth daily.    Dispense:  30 tablet    Refill:  11    Order Specific Question:  Supervising Provider    Answer:  Janeann Forehand [phone]  .  olmesartan-hydrochlorothiazide (BENICAR HCT) 20-12.5 MG tablet    Sig: Take 1 tablet by mouth daily. Reported on 07/23/2015    Dispense:  30 tablet    Refill:  11    Order Specific Question:  Supervising Provider    Answer:  Janeann Forehand [phone]  . tadalafil (CIALIS) 20 MG tablet    Sig: Take 0.5-1 tablets (10-20 mg total) by mouth every other day as needed for erectile dysfunction.    Dispense:  5 tablet    Refill:  11    Order Specific Question:  Supervising Provider    Answer:  Janeann Forehand [phone]      Follow up plan: Return in about 2 weeks (around 08/06/2015) for BP.

## 2015-07-23 NOTE — Assessment & Plan Note (Addendum)
Sleep issues are likely anxiety related. Continue unisom and melatonin. Discussed CBT for sleep as a possibility.

## 2015-07-23 NOTE — Assessment & Plan Note (Signed)
Restart Benicar at previous dose. Recheck BP in 2 week. CMET today.  Encouraged DASH diet and exercise daily.

## 2015-07-23 NOTE — Assessment & Plan Note (Signed)
Continue to follow with Dr. Regenia Skeeter.

## 2015-07-23 NOTE — Assessment & Plan Note (Signed)
On medication in the past. Recheck Vitamin D today.

## 2015-08-06 ENCOUNTER — Ambulatory Visit (INDEPENDENT_AMBULATORY_CARE_PROVIDER_SITE_OTHER): Payer: Managed Care, Other (non HMO) | Admitting: Family Medicine

## 2015-08-06 ENCOUNTER — Encounter: Payer: Self-pay | Admitting: Family Medicine

## 2015-08-06 VITALS — BP 160/96 | HR 85 | Temp 98.6°F | Resp 16 | Ht 69.0 in | Wt 176.8 lb

## 2015-08-06 DIAGNOSIS — F419 Anxiety disorder, unspecified: Secondary | ICD-10-CM

## 2015-08-06 DIAGNOSIS — F418 Other specified anxiety disorders: Secondary | ICD-10-CM | POA: Diagnosis not present

## 2015-08-06 DIAGNOSIS — N529 Male erectile dysfunction, unspecified: Secondary | ICD-10-CM | POA: Diagnosis not present

## 2015-08-06 DIAGNOSIS — I10 Essential (primary) hypertension: Secondary | ICD-10-CM

## 2015-08-06 DIAGNOSIS — F329 Major depressive disorder, single episode, unspecified: Secondary | ICD-10-CM

## 2015-08-06 DIAGNOSIS — F32A Depression, unspecified: Secondary | ICD-10-CM

## 2015-08-06 MED ORDER — SILDENAFIL CITRATE 20 MG PO TABS: ORAL_TABLET | ORAL | Status: DC

## 2015-08-06 MED ORDER — AMLODIPINE BESYLATE 5 MG PO TABS: 5.0000 mg | ORAL_TABLET | Freq: Every day | ORAL | Status: DC

## 2015-08-06 MED ORDER — BLOOD PRESSURE CUFF MISC: 1.0000 | Freq: Every day | Status: DC

## 2015-08-06 NOTE — Patient Instructions (Signed)
Your goal blood pressure is 140/90 Work on low salt/sodium diet - goal <1.5gm (1,500mg ) per day. Eat a diet high in fruits/vegetables and whole grains.  Look into mediterranean and DASH diet. Goal activity is 133min/wk of moderate intensity exercise.  This can be split into 30 minute chunks.  If you are not at this level, you can start with smaller 10-15 min increments and slowly build up activity. Look at www.heart.org for more resources  Check your blood pressure daily with a new cuff.   Add amlodipine for blood pressure.   We will check your blood pressure in 1 month. We will also check in on your zoloft.  We may increase your dose to .

## 2015-08-06 NOTE — Assessment & Plan Note (Signed)
Continue benicar. Start amlodipine for better blood pressure control. Encouraged adherence to DASH diet.  Encouraged pt to get labwork done.

## 2015-08-06 NOTE — Assessment & Plan Note (Signed)
Pt cannot afford Cialis. Generic sildenafil prescription given. Pt is aware to tell EMS if he has chest pain that he has taken Viagra.

## 2015-08-06 NOTE — Progress Notes (Signed)
Subjective:    Patient ID: Brett Huber, male    DOB: Jun 23, 1966, 50 y.o.   MRN: [id number]  HPI: Brett Huber is a 50 y.o. male presenting on 08/06/2015 for Hypertension   HPI  Pt presents for hypertension follow-up. Restarted Benicar at last visit- 20-12.5mg . No side effects. Not checking BP at home. Does not have a cuff. Doesn't eat salt.  Stays active with job. No chest pain or SOB. No visual changes.   Started zoloft last visit. Has not seen too much of a difference- has only been taking for 2 weeks.   Past Medical History  Diagnosis Date  . Hypertension   . Anxiety   . Chronic pain   . DDD (degenerative disc disease)   . Sinus congestion   . Hyperlipidemia   . Insomnia     Current Outpatient Prescriptions on File Prior to Visit  Medication Sig  . gabapentin (NEURONTIN) 300 MG capsule Take 300 mg by mouth 3 (three) times daily.  Marland Kitchen olmesartan-hydrochlorothiazide (BENICAR HCT) 20-12.5 MG tablet Take 1 tablet by mouth daily. Reported on 07/23/2015  . oxyCODONE (ROXICODONE) 15 MG immediate release tablet TK 1 T PO QID AND 1 T QHS PRN  . oxymetazoline (AFRIN) 0.05 % nasal spray Place 2 sprays into the nose 2 (two) times daily. Reported on 07/23/2015  . sertraline (ZOLOFT) 50 MG tablet Take 1 tablet (50 mg total) by mouth daily.   No current facility-administered medications on file prior to visit.    Review of Systems  Constitutional: Negative for fever and chills.  HENT: Negative.   Respiratory: Negative for chest tightness, shortness of breath and wheezing.   Cardiovascular: Negative for chest pain, palpitations and leg swelling.  Gastrointestinal: Negative for nausea, vomiting and abdominal pain.  Endocrine: Negative.   Genitourinary: Negative for dysuria, urgency, discharge, penile pain and testicular pain.  Musculoskeletal: Negative for back pain, joint swelling and arthralgias.  Skin: Negative.   Neurological: Negative for dizziness, weakness, numbness  and headaches.  Psychiatric/Behavioral: Positive for sleep disturbance and dysphoric mood.   Per HPI unless specifically indicated above     Objective:    BP 160/96 mmHg  Pulse 85  Temp(Src) 98.6 F (37 C) (Oral)  Resp 16  Ht  (1.753 m)  Wt 176 lb 12.8 oz (80.196 kg)  BMI 26.10 kg/m2  Wt Readings from Last 3 Encounters:  08/06/15 176 lb 12.8 oz (80.196 kg)  07/23/15 185 lb (83.915 kg)  01/29/13 178 lb 2 oz (80.797 kg)    Physical Exam  Constitutional: He is oriented to person, place, and time. He appears well-developed and well-nourished. No distress.  HENT:  Head: Normocephalic and atraumatic.  Neck: Neck supple. No thyromegaly present.  Cardiovascular: Normal rate, regular rhythm and normal heart sounds.  Exam reveals no gallop and no friction rub.   No murmur heard. Pulmonary/Chest: Effort normal and breath sounds normal. He has no wheezes.  Abdominal: Soft. Bowel sounds are normal. He exhibits no distension. There is no tenderness. There is no rebound.  Musculoskeletal: Normal range of motion. He exhibits no edema or tenderness.  Neurological: He is alert and oriented to person, place, and time. He has normal reflexes.  Skin: Skin is warm and dry. No rash noted. No erythema.  Psychiatric: He has a normal mood and affect. His behavior is normal. Judgment and thought content normal.   Results for orders placed or performed during the hospital encounter of 01/29/13  Basic metabolic panel  Result Value Ref Range   Sodium 140 135 - 145 mEq/L   Potassium 4.2 3.5 - 5.1 mEq/L   Chloride 104 96 - 112 mEq/L   CO2 25 19 - 32 mEq/L   Glucose, Bld 153 (H) 70 - 99 mg/dL   BUN 23 6 - 23 mg/dL   Creatinine, Ser 1.61 0.50 - 1.35 mg/dL   Calcium 9.9 8.4 - 09.6 mg/dL   GFR calc non Af Amer 87 (L) >90 mL/min   GFR calc Af Amer >90 >90 mL/min  Hemoglobin-hemacue, POC  Result Value Ref Range   Hemoglobin 15.5 13.0 - 17.0 g/dL      Assessment & Plan:   Problem List Items  Addressed This Visit      Cardiovascular and Mediastinum   Hypertension - Primary    Continue benicar. Start amlodipine for better blood pressure control. Encouraged adherence to DASH diet.  Encouraged pt to get labwork done.       Relevant Medications   amLODipine (NORVASC) 5 MG tablet   Blood Pressure Monitoring (BLOOD PRESSURE CUFF) MISC   sildenafil (REVATIO) 20 MG tablet     Genitourinary   Erectile dysfunction    Pt cannot afford Cialis. Generic sildenafil prescription given. Pt is aware to tell EMS if he has chest pain that he has taken Viagra.       Relevant Medications   sildenafil (REVATIO) 20 MG tablet     Other   Anxiety and depression    No current effects from sertraline. Recheck at next visit and plan to increase to .          Meds ordered this encounter  Medications  . amLODipine (NORVASC) 5 MG tablet    Sig: Take 1 tablet (5 mg total) by mouth daily.    Dispense:  30 tablet    Refill:  11    Order Specific Question:  Supervising Provider    Answer:  Janeann Forehand [phone]  . Blood Pressure Monitoring (BLOOD PRESSURE CUFF) MISC    Sig: 1 each by Does not apply route daily.    Dispense:  1 each    Refill:  0    Order Specific Question:  Supervising Provider    Answer:  Janeann Forehand [phone]  . sildenafil (REVATIO) 20 MG tablet    Sig: Take 3-5 pills as needed 30 minute prior to sex.    Dispense:  30 tablet    Refill:  0    Order Specific Question:  Supervising Provider    Answer:  Janeann Forehand [[phone]]      Follow up plan: Return in about 4 weeks (around 09/03/2015) for HTN.Marland Kitchen

## 2015-08-06 NOTE — Assessment & Plan Note (Signed)
No current effects from sertraline. Recheck at next visit and plan to increase to .

## 2015-08-12 ENCOUNTER — Emergency Department
Admission: EM | Admit: 2015-08-12 | Discharge: 2015-08-12 | Disposition: A | Discharge status: Home / Self Care | Payer: Managed Care, Other (non HMO) | Attending: Emergency Medicine | Admitting: Emergency Medicine

## 2015-08-12 ENCOUNTER — Encounter: Payer: Self-pay | Admitting: Medical Oncology

## 2015-08-12 ENCOUNTER — Ambulatory Visit (INDEPENDENT_AMBULATORY_CARE_PROVIDER_SITE_OTHER): Payer: Managed Care, Other (non HMO) | Admitting: Family Medicine

## 2015-08-12 ENCOUNTER — Emergency Department: Payer: Managed Care, Other (non HMO)

## 2015-08-12 ENCOUNTER — Encounter: Payer: Self-pay | Admitting: Family Medicine

## 2015-08-12 VITALS — BP 120/87 | HR 118 | Temp 98.5°F | Resp 22 | Ht 69.0 in | Wt 169.0 lb

## 2015-08-12 DIAGNOSIS — I10 Essential (primary) hypertension: Secondary | ICD-10-CM | POA: Insufficient documentation

## 2015-08-12 DIAGNOSIS — R000 Tachycardia, unspecified: Secondary | ICD-10-CM

## 2015-08-12 DIAGNOSIS — J069 Acute upper respiratory infection, unspecified: Secondary | ICD-10-CM | POA: Diagnosis not present

## 2015-08-12 DIAGNOSIS — R0602 Shortness of breath: Secondary | ICD-10-CM | POA: Diagnosis not present

## 2015-08-12 DIAGNOSIS — A09 Infectious gastroenteritis and colitis, unspecified: Secondary | ICD-10-CM

## 2015-08-12 DIAGNOSIS — R197 Diarrhea, unspecified: Secondary | ICD-10-CM | POA: Insufficient documentation

## 2015-08-12 DIAGNOSIS — Z79899 Other long term (current) drug therapy: Secondary | ICD-10-CM | POA: Diagnosis not present

## 2015-08-12 DIAGNOSIS — R6889 Other general symptoms and signs: Secondary | ICD-10-CM

## 2015-08-12 LAB — CBC
HCT: 49.1 % (ref 40.0–52.0)
HEMOGLOBIN: 17 g/dL (ref 13.0–18.0)
MCH: 28 pg (ref 26.0–34.0)
MCHC: 34.6 g/dL (ref 32.0–36.0)
MCV: 80.7 fL (ref 80.0–100.0)
PLATELETS: 360 10*3/uL (ref 150–440)
RBC: 6.08 MIL/uL — ABNORMAL HIGH (ref 4.40–5.90)
RDW: 14.6 % — ABNORMAL HIGH (ref 11.5–14.5)
WBC: 10.5 10*3/uL (ref 3.8–10.6)

## 2015-08-12 LAB — COMPREHENSIVE METABOLIC PANEL
ALBUMIN: 4.1 g/dL (ref 3.5–5.0)
ALK PHOS: 52 U/L (ref 38–126)
ALT: 17 U/L (ref 17–63)
ANION GAP: 10 (ref 5–15)
AST: 28 U/L (ref 15–41)
BUN: 17 mg/dL (ref 6–20)
CALCIUM: 9.6 mg/dL (ref 8.9–10.3)
CHLORIDE: 102 mmol/L (ref 101–111)
CO2: 25 mmol/L (ref 22–32)
Creatinine, Ser: 1.52 mg/dL — ABNORMAL HIGH (ref 0.61–1.24)
GFR calc Af Amer: 60 mL/min — ABNORMAL LOW (ref >60)
GFR calc non Af Amer: 51 mL/min — ABNORMAL LOW (ref >60)
GLUCOSE: 126 mg/dL — AB (ref 65–99)
Potassium: 3.7 mmol/L (ref 3.5–5.1)
SODIUM: 137 mmol/L (ref 135–145)
Total Bilirubin: 0.7 mg/dL (ref 0.3–1.2)
Total Protein: 8.2 g/dL — ABNORMAL HIGH (ref 6.5–8.1)

## 2015-08-12 LAB — POCT INFLUENZA A/B
INFLUENZA A, POC: NEGATIVE
Influenza B, POC: NEGATIVE

## 2015-08-12 LAB — LIPASE, BLOOD: Lipase: 22 U/L (ref 11–51)

## 2015-08-12 MED ORDER — ONDANSETRON HCL 4 MG PO TABS: 4.0000 mg | ORAL_TABLET | Freq: Every day | ORAL | Status: DC | PRN

## 2015-08-12 MED ORDER — SODIUM CHLORIDE 0.9 % IV BOLUS (SEPSIS)
1000.0000 mL | Freq: Once | INTRAVENOUS | Status: AC
  Administered 2015-08-12: 1000 mL via INTRAVENOUS

## 2015-08-12 NOTE — Discharge Instructions (Signed)
Her creatinine was somewhat up, 1.5. This is not dangerous but it does need to be rechecked. Drink plenty of fluids follow closely with her doctor in the next day or 2 and return to the emergency room for any new or worrisome symptoms including bleeding, vomiting, dehydration, shortness of breath or worsening cough.

## 2015-08-12 NOTE — ED Provider Notes (Signed)
[facility]  ____________________________________________   I have reviewed the triage vital signs and the nursing notes.   HISTORY  Chief Complaint Diarrhea and URI    HPI Brett Huber is a 50 y.o. male presents today complaining of multiple different things. Patient has had watery nonbloody brown and yellow diarrhea for the last 3 days.. No fever no chills. He did have a negative flu test apparently at the [facility]. Patient also has had a nonproductive cough. He states he had posttussive emesis 1. He denies any fever or chills. Denies any abdominal pain. States he feels somewhat dehydrated, and he was sent here for IV fluid. He states he has not vomited except for once when he was coughing very hard. He is not having a productive cough.  Past Medical History  Diagnosis Date  . Hypertension   . Anxiety   . Chronic pain   . DDD (degenerative disc disease)   . Sinus congestion   . Hyperlipidemia   . Insomnia     Patient Active Problem List   Diagnosis Date Noted  . Hypertension 07/23/2015  . Hyperlipidemia 07/23/2015  . Anxiety and depression 07/23/2015  . Chronic pain 07/23/2015  . Insomnia 07/23/2015  . Erectile dysfunction 07/23/2015  . Vitamin D deficiency 07/23/2015    Past Surgical History  Procedure Laterality Date  . Cholecystectomy  2007  . Cervical fusion  2004  . Shoulder arthroscopy with rotator cuff repair and subacromial decompression Left 01/29/2013    Procedure: LEFT SHOULDER ARTHROSCOPY WITH ROTATOR CUFF REPAIR AND SUBACROMIAL DECOMPRESSION DISTAL CLAVICLE RESECTION;  Surgeon: Mable Paris, MD;  Location: [facility];  Service: Orthopedics;  Laterality: Left;  . Neck and disc replacement      Current Outpatient Rx  Name  Route  Sig  Dispense  Refill  . amLODipine (NORVASC) 5 MG tablet   Oral   Take 1 tablet (5 mg total) by mouth daily.   30 tablet   11    . gabapentin (NEURONTIN) 300 MG capsule   Oral   Take 300 mg by mouth 3 (three) times daily.         Marland Kitchen olmesartan-hydrochlorothiazide (BENICAR HCT) 20-12.5 MG tablet   Oral   Take 1 tablet by mouth daily. Reported on 07/23/2015   30 tablet   11   . oxyCODONE (ROXICODONE) 15 MG immediate release tablet   Oral   Take 15 mg by mouth 4 (four) times daily as needed for pain.         Marland Kitchen oxyCODONE-acetaminophen (PERCOCET) 10-325 MG tablet   Oral   Take 1 tablet by mouth daily.         Marland Kitchen oxymetazoline (AFRIN) 0.05 % nasal spray   Nasal   Place 2 sprays into the nose 2 (two) times daily as needed. Reported on 07/23/2015         . sertraline (ZOLOFT) 50 MG tablet   Oral   Take 1 tablet (50 mg total) by mouth daily.   30 tablet   11   . sildenafil (REVATIO) 20 MG tablet      Take 3-5 pills as needed 30 minute prior to sex.   30 tablet   0     Allergies Bee venom  Family History  Problem Relation Age of Onset  . Diabetes Mother   . Stroke Father     Social History Social History  Substance Use Topics  . Smoking status:  Never Smoker   . Smokeless tobacco: Current User    Types: Chew  . Alcohol Use: Yes     Comment: occ    Review of Systems Constitutional: No fever/chills Eyes: No visual changes. ENT: No sore throat. No stiff neck no neck pain Cardiovascular: Denies chest pain. Respiratory: Denies shortness of breath. Positive cough Gastrointestinal:   See history of present illness Genitourinary: Negative for dysuria. Musculoskeletal: Negative lower extremity swelling Skin: Negative for rash. Neurological: Negative for headaches, focal weakness or numbness. 10-point ROS otherwise negative.  ____________________________________________   PHYSICAL EXAM:  VITAL SIGNS: ED Triage Vitals  Enc Vitals Group     BP 08/12/15 1040 108/88 mmHg     Pulse Rate 08/12/15 1040 105     Resp 08/12/15 1040 20     Temp 08/12/15 1040 97.6 F (36.4 C)     Temp  Source 08/12/15 1040 Oral     SpO2 08/12/15 1040 97 %     Weight 08/12/15 1040 175 lb (79.379 kg)     Height 08/12/15 1040 5\' 9"  (1.753 m)     Head Cir --      Peak Flow --      Pain Score 08/12/15 1041 4     Pain Loc --      Pain Edu? --      Excl. in GC? --     Constitutional: Alert and oriented. Well appearing and in no acute distress. Eyes: Conjunctivae are normal. PERRL. EOMI. Head: Atraumatic. Nose: No congestion/rhinnorhea. Mouth/Throat: Mucous membranes are slightly dry.  Oropharynx non-erythematous. Neck: No stridor.   Nontender with no meningismus Cardiovascular: Normal rate, regular rhythm. Grossly normal heart sounds.  Good peripheral circulation. Respiratory: Normal respiratory effort.  No retractions. Lungs CTAB. Abdominal: Soft and nontender. No distention. No guarding no rebound Back:  There is no focal tenderness or step off there is no midline tenderness there are no lesions noted. there is no CVA tenderness Musculoskeletal: No lower extremity tenderness. No joint effusions, no DVT signs strong distal pulses no edema Neurologic:  Normal speech and language. No gross focal neurologic deficits are appreciated.  Skin:  Skin is warm, dry and intact. No rash noted. Psychiatric: Mood and affect are normal. Speech and behavior are normal.  ____________________________________________   LABS (all labs ordered are listed, but only abnormal results are displayed)  Labs Reviewed  COMPREHENSIVE METABOLIC PANEL - Abnormal; Notable for the following:    Glucose, Bld 126 (*)    Creatinine, Ser 1.52 (*)    Total Protein 8.2 (*)    GFR calc non Af Amer 51 (*)    GFR calc Af Amer 60 (*)    All other components within normal limits  CBC - Abnormal; Notable for the following:    RBC 6.08 (*)    RDW 14.6 (*)    All other components within normal limits  LIPASE, BLOOD   ____________________________________________  EKG  I personally interpreted any EKGs ordered by me or  triage  ____________________________________________  RADIOLOGY  I reviewed any imaging ordered by me or triage that were performed during my shift ____________________________________________   PROCEDURES  Procedure(s) performed: None  Critical Care performed: None  ____________________________________________   INITIAL IMPRESSION / ASSESSMENT AND PLAN / ED COURSE  Pertinent labs & imaging results that were available during my care of the patient were reviewed by me and considered in my medical decision making (see chart for details).  Very well-appearing male with dehydration and a cough.  Very consistent with a viral syndrome. He does appear to be somewhat dehydrated and we have given him copious IV fluid here. His creatinine is 1.5 do not know his baseline and was last checked one year ago however I'm concerned that this is an acute issue. Fortunately his BUN is not elevated he is not acidotic on his BMP liver function tests are reassuring, chest x-ray is reassuring, vital signs are reassuring, patient is tolerating by mouth with no difficulty and eager to go home. No indication for acute antibiosis. Patient given extensive return precautions and close follow-up. Serial abdominal exams are completely benign and lungs are clear at discharge ____________________________________________   FINAL CLINICAL IMPRESSION(S) / ED DIAGNOSES  Final diagnoses:  None      This chart was dictated using voice recognition software.  Despite best efforts to proofread,  errors can occur which can change meaning.     Jeanmarie Plant, MD 08/12/15 [phone]

## 2015-08-12 NOTE — ED Notes (Signed)
Patient transported to X-ray 

## 2015-08-12 NOTE — Progress Notes (Signed)
Subjective:    Patient ID: Brett Huber, male    DOB: [birth year], 50 y.o.   MRN: [id number]  HPI: [name] is a 50 y.o. male presenting on 08/12/2015 for Nasal Congestion   HPI  Pt presents for possible flu. Symptoms began 4 days ago with fever and body aches, malaise. Cough and congestion began on Sunday. Nausea and diarrhea started Sunday. Diarrhea constantly- taking immodium. Drinking only water. Unable to keep food down.  Started coughing up phlegm on Sunday and Monday- brown sputum. Dizziness when he walks. Feeling very short of breath today. No chest pain but chest tightness and difficulty taking a deep breath. Has not taken blood pressure medication because he is sick.  Past Medical History  Diagnosis Date  . Hypertension   . Anxiety   . Chronic pain   . DDD (degenerative disc disease)   . Sinus congestion   . Hyperlipidemia   . Insomnia     Current Outpatient Prescriptions on File Prior to Visit  Medication Sig  . amLODipine (NORVASC) 5 MG tablet Take 1 tablet (5 mg total) by mouth daily.  . Blood Pressure Monitoring (BLOOD PRESSURE CUFF) MISC 1 each by Does not apply route daily.  Marland Kitchen gabapentin (NEURONTIN) 300 MG capsule Take 300 mg by mouth 3 (three) times daily.  Marland Kitchen olmesartan-hydrochlorothiazide (BENICAR HCT) 20-12.5 MG tablet Take 1 tablet by mouth daily. Reported on 07/23/2015  . oxyCODONE (ROXICODONE) 15 MG immediate release tablet TK 1 T PO QID AND 1 T QHS PRN  . oxymetazoline (AFRIN) 0.05 % nasal spray Place 2 sprays into the nose 2 (two) times daily. Reported on 07/23/2015  . sertraline (ZOLOFT) 50 MG tablet Take 1 tablet (50 mg total) by mouth daily.  . sildenafil (REVATIO) 20 MG tablet Take 3-5 pills as needed 30 minute prior to sex.   No current facility-administered medications on file prior to visit.    Review of Systems  Constitutional: Positive for fever and chills.  HENT: Positive for congestion and sore throat. Negative for ear  discharge, ear pain, sinus pressure and trouble swallowing.   Respiratory: Positive for cough, chest tightness and shortness of breath. Negative for choking.   Cardiovascular: Negative for chest pain and palpitations.  Gastrointestinal: Positive for nausea and diarrhea. Negative for vomiting and abdominal pain.  Musculoskeletal: Positive for myalgias. Negative for neck pain and neck stiffness.  Skin: Negative for rash.  Neurological: Positive for dizziness and headaches. Negative for syncope and weakness.   Per HPI unless specifically indicated above     Objective:    BP 120/87 mmHg  Pulse 118  Temp(Src) 98.5 F (36.9 C) (Oral)  Resp 22  Ht  (1.753 m)  Wt 169 lb (76.658 kg)  BMI 24.95 kg/m2  SpO2 98%  Wt Readings from Last 3 Encounters:  08/12/15 169 lb (76.658 kg)  08/06/15 176 lb 12.8 oz (80.196 kg)  07/23/15 185 lb (83.915 kg)    Physical Exam  Constitutional: He is oriented to person, place, and time. He appears ill.  Neck: Normal range of motion. Neck supple. No Brudzinski's sign and no Kernig's sign noted.  Cardiovascular: Regular rhythm.   No extrasystoles are present. Tachycardia present.  Exam reveals no gallop and no friction rub.   No murmur heard. Pulmonary/Chest: No accessory muscle usage. Tachypnea noted. He has decreased breath sounds in the right middle field and the left middle field. He has no wheezes. He has rhonchi in the right lower field and  the left lower field. He has no rales. Chest wall is dull to percussion (R lower lobe.). He exhibits no tenderness.  Abdominal: Soft. Normal appearance. Bowel sounds are increased. There is no hepatosplenomegaly. There is no tenderness.  Neurological: He is alert and oriented to person, place, and time.   Results for orders placed or performed during the hospital encounter of 01/29/13  Basic metabolic panel  Result Value Ref Range   Sodium 140 135 - 145 mEq/L   Potassium 4.2 3.5 - 5.1 mEq/L   Chloride 104 96 -  112 mEq/L   CO2 25 19 - 32 mEq/L   Glucose, Bld 153 (H) 70 - 99 mg/dL   BUN 23 6 - 23 mg/dL   Creatinine, Ser 7.25 0.50 - 1.35 mg/dL   Calcium 9.9 8.4 - 36.6 mg/dL   GFR calc non Af Amer 87 (L) >90 mL/min   GFR calc Af Amer >90 >90 mL/min  Hemoglobin-hemacue, POC  Result Value Ref Range   Hemoglobin 15.5 13.0 - 17.0 g/dL      Assessment & Plan:   Problem List Items Addressed This Visit    None    Visit Diagnoses    Flu-like symptoms    -  Primary    Flu test is negative. However assuming viral etiology of symptoms. Pt to ER for evaluation of dehydration and possible pneumonia.     Relevant Orders    POCT Influenza A/B    Tachycardia        Likely due to illness and dehydration.     Shortness of breath        Likely due to viral illness and possible pneumonia. Given patient elevated respiratory rate and tachycardia on exam have asked him to be elevated in ER. Pt has no one to drive him. He is refusing to go via EMS. Has advised against driving given dizziness and shortness of breath. Pt states "I think I can drive."    Diarrhea of infectious origin        Likely due to viral illness and source of dehydration.Pt to ER for evaluation. Report called to [facility] Charge RN        No orders of the defined types were placed in this encounter.      Follow up plan: Return if symptoms worsen or fail to improve.

## 2015-08-12 NOTE — ED Notes (Signed)
Discussed discharge instructions, prescriptions, and follow-up care with the patient. No questions or concerns at this time. Pt stable at discharge.  

## 2015-08-12 NOTE — Patient Instructions (Signed)
I think you need to be evaluated in the ER for possible dehydration. I think you could also have a pneumonia. I have told the ER charge nurse you are coming. I would feel more comfortable if someone else drove you or you went by EMS.

## 2015-08-12 NOTE — ED Notes (Signed)
Pt reports that he began Sunday with flu like sx's, this am went to PCP and tested neg for flu was told to come to er for fluids bc he has been having diarrhea since Sunday.

## 2015-08-15 ENCOUNTER — Telehealth: Payer: Self-pay | Admitting: Family Medicine

## 2015-08-15 NOTE — Telephone Encounter (Signed)
Pt. called requesting a letter to be out of work [id number] and Tuesday 14th   Fax to  [phone]

## 2015-08-18 NOTE — Telephone Encounter (Signed)
Done

## 2015-09-03 ENCOUNTER — Ambulatory Visit: Payer: Managed Care, Other (non HMO) | Admitting: Family Medicine

## 2015-12-02 ENCOUNTER — Other Ambulatory Visit: Payer: Self-pay | Admitting: Family Medicine

## 2015-12-02 ENCOUNTER — Ambulatory Visit (INDEPENDENT_AMBULATORY_CARE_PROVIDER_SITE_OTHER): Payer: Managed Care, Other (non HMO) | Admitting: Family Medicine

## 2015-12-02 ENCOUNTER — Encounter: Payer: Self-pay | Admitting: Family Medicine

## 2015-12-02 VITALS — BP 125/80 | HR 64 | Temp 98.0°F | Resp 16 | Ht 69.0 in | Wt 185.0 lb

## 2015-12-02 DIAGNOSIS — F418 Other specified anxiety disorders: Secondary | ICD-10-CM | POA: Diagnosis not present

## 2015-12-02 DIAGNOSIS — F32A Depression, unspecified: Secondary | ICD-10-CM

## 2015-12-02 DIAGNOSIS — G4700 Insomnia, unspecified: Secondary | ICD-10-CM | POA: Diagnosis not present

## 2015-12-02 DIAGNOSIS — F329 Major depressive disorder, single episode, unspecified: Secondary | ICD-10-CM

## 2015-12-02 DIAGNOSIS — F419 Anxiety disorder, unspecified: Secondary | ICD-10-CM

## 2015-12-02 MED ORDER — SUVOREXANT 10 MG PO TABS: 1.0000 | ORAL_TABLET | Freq: Every evening | ORAL | Status: DC | PRN

## 2015-12-02 MED ORDER — SUVOREXANT 15 MG PO TABS: 1.0000 | ORAL_TABLET | Freq: Every evening | ORAL | Status: DC | PRN

## 2015-12-02 NOTE — Assessment & Plan Note (Signed)
Appears controlled. Will try taking Zoloft upon awakening to see if that helps his sleep issues. Recheck 4 weeks.

## 2015-12-02 NOTE — Patient Instructions (Signed)
Learning About Sleeping Well What does sleeping well mean? Sleeping well means getting enough sleep. How much sleep is enough varies among people. The number of hours you sleep is not as important as how you feel when you wake up. If you do not feel refreshed, you probably need more sleep. Another sign of not getting enough sleep is feeling tired during the day. The average total nightly sleep time is 7 to 8 hours. Healthy adults may need a little more or a little less than this. Why is getting enough sleep important? Getting enough quality sleep is a basic part of good health. When your sleep suffers, your mood and your thoughts can suffer too. You may find yourself feeling more grumpy or stressed. Not getting enough sleep also can lead to serious problems, including injury, accidents, anxiety, and depression. What might cause poor sleeping? Many things can cause sleep problems, including: 1. Stress. Stress can be caused by fear about a single event, such as giving a speech. Or you may have ongoing stress, such as worry about work or school. 2. Depression, anxiety, and other mental or emotional conditions. 3. Changes in your sleep habits or surroundings. This includes changes that happen where you sleep, such as noise, light, or sleeping in a different bed. It also includes changes in your sleep pattern, such as having jet lag or working a late shift. 4. Health problems, such as pain, breathing problems, and restless legs syndrome. 5. Lack of regular exercise. How can you help yourself? Here are some tips that may help you sleep more soundly and wake up feeling more refreshed. Your sleeping area  1. Use your bedroom only for sleeping and sex. A bit of light reading may help you fall asleep. But if it doesn't, do your reading elsewhere in the house. Don't watch TV in bed. 2. Be sure your bed is big enough to stretch out comfortably, especially if you have a sleep partner. 3. Keep your bedroom  quiet, dark, and cool. Use curtains, blinds, or a sleep mask to block out light. To block out noise, use earplugs, soothing music, or a "white noise" machine. Your evening and bedtime routine  1. Create a relaxing bedtime routine. You might want to take a warm shower or bath, listen to soothing music, or drink a cup of noncaffeinated tea. 2. Go to bed at the same time every night. And get up at the same time every morning, even if you feel tired. What to avoid  1. Limit caffeine (coffee, tea, caffeinated sodas) during the day, and don't have any for at least 4 to 6 hours before bedtime. 2. Don't drink alcohol before bedtime. Alcohol can cause you to wake up more often during the night. 3. Don't smoke or use tobacco, especially in the evening. Nicotine can keep you awake. 4. Don't take naps during the day, especially close to bedtime. 5. Don't lie in bed awake for too long. If you can't fall asleep, or if you wake up in the middle of the night and can't get back to sleep within 15 minutes or so, get out of bed and go to another room until you feel sleepy. 6. Don't take medicine right before bed that may keep you awake or make you feel hyper or energized. Your doctor can tell you if your medicine may do this and if you can take it earlier in the day. If you can't sleep   Imagine yourself in a peaceful, pleasant scene. Focus on   the details and feelings of being in a place that is relaxing.  Get up and do a quiet or boring activity until you feel sleepy.  Don't drink any liquids after 6 p.m. if you wake up often because you have to go to the bathroom.   

## 2015-12-02 NOTE — Progress Notes (Signed)
Subjective:    Patient ID: Brett Huber, male    DOB: 01-22-1966, 50 y.o.   MRN: [id number]  HPI: Brett Huber is a 50 y.o. male presenting on 12/02/2015 for Insomnia   HPI  Pt is having difficulty sleeping. Feeling depression is better controlled. Had tried increasing zoloft to help wit his symptoms. DId not help with sleep. 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 Some night can't sleep until 6 am.   Has trouble going to sleep. When he sleeps- wakes up within 1 hour. Tries to to get back to sleep- but rarely able. Taking 10mg  of melatonin, 2 unisoms, and zoloft prior to bed- 2 ibuprofen PM's. No creepy crawly sensation in the legs. Had taken trazodone 150mg  night- only helped with combo with ambien. Trazodone helps him stay asleep. Has had 2 sleep studies. Home sleep studies- did not have sleep apnea.    Past Medical History  Diagnosis Date  . Hypertension   . Anxiety   . Chronic pain   . DDD (degenerative disc disease)   . Sinus congestion   . Hyperlipidemia   . Insomnia     Current Outpatient Prescriptions on File Prior to Visit  Medication Sig  . amLODipine (NORVASC) 5 MG tablet Take 1 tablet (5 mg total) by mouth daily.  Marland Kitchen gabapentin (NEURONTIN) 300 MG capsule Take 300 mg by mouth 3 (three) times daily.  Marland Kitchen olmesartan-hydrochlorothiazide (BENICAR HCT) 20-12.5 MG tablet Take 1 tablet by mouth daily. Reported on 07/23/2015  . ondansetron (ZOFRAN) 4 MG tablet Take 1 tablet (4 mg total) by mouth daily as needed for nausea or vomiting.  Marland Kitchen oxyCODONE (ROXICODONE) 15 MG immediate release tablet Take 15 mg by mouth 4 (four) times daily as needed for pain.  Marland Kitchen oxyCODONE-acetaminophen (PERCOCET) 10-325 MG tablet Take 1 tablet by mouth daily.  Marland Kitchen oxymetazoline (AFRIN) 0.05 % nasal spray Place 2 sprays into the nose 2 (two) times daily as needed. Reported on 07/23/2015  . sertraline (ZOLOFT) 50 MG tablet Take 1 tablet (50 mg total) by mouth daily.  .  sildenafil (REVATIO) 20 MG tablet Take 3-5 pills as needed 30 minute prior to sex.   No current facility-administered medications on file prior to visit.    Review of Systems  Constitutional: Negative for fever and chills.  HENT: Negative.   Respiratory: Negative for chest tightness, shortness of breath and wheezing.   Cardiovascular: Negative for chest pain, palpitations and leg swelling.  Gastrointestinal: Negative for nausea, vomiting and abdominal pain.  Endocrine: Negative.   Genitourinary: Negative for dysuria, urgency, discharge, penile pain and testicular pain.  Musculoskeletal: Negative for back pain, joint swelling and arthralgias.  Skin: Negative.   Neurological: Negative for dizziness, weakness, numbness and headaches.  Psychiatric/Behavioral: Positive for sleep disturbance. Negative for dysphoric mood. The patient is nervous/anxious.    Per HPI unless specifically indicated above Depression screen [facility] 2/9 12/02/2015 07/23/2015 07/23/2015  Decreased Interest 1 2 0  Down, Depressed, Hopeless 0 2 0  PHQ - 2 Score 1 4 0  Altered sleeping 3 3 -  Tired, decreased energy 2 3 -  Change in appetite 0 3 -  Feeling bad or failure about yourself  0 2 -  Trouble concentrating 0 1 -  Moving slowly or fidgety/restless 1 0 -  Suicidal thoughts 0 1 -  PHQ-9 Score 7 17 -  Difficult doing work/chores - Somewhat difficult -       Objective:  BP 125/80 mmHg  Pulse 64  Temp(Src) 98 F (36.7 C) (Oral)  Resp 16  Ht  (1.753 m)  Wt 185 lb (83.915 kg)  BMI 27.31 kg/m2  Wt Readings from Last 3 Encounters:  12/02/15 185 lb (83.915 kg)  08/12/15 175 lb (79.379 kg)  08/12/15 169 lb (76.658 kg)    Physical Exam  Constitutional: He is oriented to person, place, and time. He appears well-developed and well-nourished. No distress.  HENT:  Head: Normocephalic and atraumatic.  Neck: Neck supple. No thyromegaly present.  Cardiovascular: Normal rate, regular rhythm and normal heart  sounds.  Exam reveals no gallop and no friction rub.   No murmur heard. Pulmonary/Chest: Effort normal and breath sounds normal. He has no wheezes.  Abdominal: Soft. Bowel sounds are normal. He exhibits no distension. There is no tenderness. There is no rebound.  Musculoskeletal: Normal range of motion. He exhibits no edema or tenderness.  Neurological: He is alert and oriented to person, place, and time. He has normal reflexes.  Skin: Skin is warm and dry. No rash noted. No erythema.  Psychiatric: He has a normal mood and affect. His speech is normal and behavior is normal. Thought content normal. He expresses no suicidal ideation. He expresses no suicidal plans.   Results for orders placed or performed during the hospital encounter of 08/12/15  Lipase, blood  Result Value Ref Range   Lipase 22 11 - 51 U/L  Comprehensive metabolic panel  Result Value Ref Range   Sodium 137 135 - 145 mmol/L   Potassium 3.7 3.5 - 5.1 mmol/L   Chloride 102 101 - 111 mmol/L   CO2 25 22 - 32 mmol/L   Glucose, Bld 126 (H) 65 - 99 mg/dL   BUN 17 6 - 20 mg/dL   Creatinine, Ser 9.81 (H) 0.61 - 1.24 mg/dL   Calcium 9.6 8.9 - 19.1 mg/dL   Total Protein 8.2 (H) 6.5 - 8.1 g/dL   Albumin 4.1 3.5 - 5.0 g/dL   AST 28 15 - 41 U/L   ALT 17 17 - 63 U/L   Alkaline Phosphatase 52 38 - 126 U/L   Total Bilirubin 0.7 0.3 - 1.2 mg/dL   GFR calc non Af Amer 51 (L) >60 mL/min   GFR calc Af Amer 60 (L) >60 mL/min   Anion gap 10 5 - 15  CBC  Result Value Ref Range   WBC 10.5 3.8 - 10.6 K/uL   RBC 6.08 (H) 4.40 - 5.90 MIL/uL   Hemoglobin 17.0 13.0 - 18.0 g/dL   HCT 47.8 29.5 - 62.1 %   MCV 80.7 80.0 - 100.0 fL   MCH 28.0 26.0 - 34.0 pg   MCHC 34.6 32.0 - 36.0 g/dL   RDW 30.8 (H) 65.7 - 84.6 %   Platelets 360 150 - 440 K/uL      Assessment & Plan:   Problem List Items Addressed This Visit      Other   Anxiety and depression    Appears controlled. Will try taking Zoloft upon awakening to see if that helps his  sleep issues. Recheck 4 weeks.       Insomnia - Primary    Trial of Belsomra to help with falling asleep. Avoid any other sedative- reduce unisom use.Marland Kitchen Avoid alcohol. Reviewed sleep hygiene tips. Encouraged CBT for sleep. Consider sleep medicine consult if not improved.  Recheck 4 weeks.       Relevant Medications   Suvorexant (BELSOMRA) 10 MG TABS  Suvorexant (BELSOMRA) 15 MG TABS      Meds ordered this encounter  Medications  . Suvorexant (BELSOMRA) 10 MG TABS    Sig: Take 1 tablet by mouth at bedtime as needed.    Dispense:  10 tablet    Refill:  0    Order Specific Question:  Supervising Provider    Answer:  Janeann ForehandHAWKINS JR, JAMES H [phone][[id number]]  . Suvorexant (BELSOMRA) 15 MG TABS    Sig: Take 1 tablet by mouth at bedtime as needed.    Dispense:  10 tablet    Refill:  0    Order Specific Question:  Supervising Provider    Answer:  Janeann ForehandHAWKINS JR, JAMES H [[id number]]      Follow up plan: Return in about 4 weeks (around 12/30/2015) for sleep issues. .Marland Kitchen

## 2015-12-02 NOTE — Assessment & Plan Note (Signed)
Trial of Belsomra to help with falling asleep. Avoid any other sedative- reduce unisom use.Marland Kitchen. Avoid alcohol. Reviewed sleep hygiene tips. Encouraged CBT for sleep. Consider sleep medicine consult if not improved.  Recheck 4 weeks.

## 2015-12-18 ENCOUNTER — Other Ambulatory Visit: Payer: Self-pay | Admitting: Family Medicine

## 2015-12-18 ENCOUNTER — Telehealth: Payer: Self-pay | Admitting: Family Medicine

## 2015-12-18 MED ORDER — SUVOREXANT 15 MG PO TABS: 1.0000 | ORAL_TABLET | Freq: Every evening | ORAL | Status: DC | PRN

## 2015-12-18 NOTE — Telephone Encounter (Signed)
Called for clarification on what patient needed.

## 2015-12-19 ENCOUNTER — Telehealth: Payer: Self-pay | Admitting: Family Medicine

## 2015-12-23 NOTE — Telephone Encounter (Signed)
Error

## 2016-02-16 ENCOUNTER — Other Ambulatory Visit: Payer: Self-pay | Admitting: Family Medicine

## 2016-02-17 ENCOUNTER — Other Ambulatory Visit: Payer: Self-pay | Admitting: Family Medicine

## 2016-03-26 ENCOUNTER — Ambulatory Visit (INDEPENDENT_AMBULATORY_CARE_PROVIDER_SITE_OTHER): Payer: Managed Care, Other (non HMO) | Admitting: Family Medicine

## 2016-03-26 ENCOUNTER — Encounter: Payer: Self-pay | Admitting: Family Medicine

## 2016-03-26 VITALS — BP 145/99 | HR 94 | Temp 98.8°F | Resp 16 | Ht 69.0 in | Wt 179.0 lb

## 2016-03-26 DIAGNOSIS — G4700 Insomnia, unspecified: Secondary | ICD-10-CM | POA: Diagnosis not present

## 2016-03-26 DIAGNOSIS — Z23 Encounter for immunization: Secondary | ICD-10-CM

## 2016-03-26 MED ORDER — HYDROXYZINE PAMOATE 25 MG PO CAPS: 25.0000 mg | ORAL_CAPSULE | Freq: Every evening | ORAL | 1 refills | Status: DC | PRN

## 2016-03-26 MED ORDER — AMITRIPTYLINE HCL 25 MG PO TABS: 25.0000 mg | ORAL_TABLET | Freq: Every day | ORAL | 1 refills | Status: DC

## 2016-03-26 NOTE — Progress Notes (Signed)
Name: Brett Huber   MRN: [id number]    DOB: 02/20/1966   Date:03/26/2016       Progress Note  Subjective  Chief Complaint  Chief Complaint  Patient presents with  . Insomnia    meds that [facility] not working for patient Belsomra    HPI Has insomnia x 25 years.  Has used many products in past.  He reports that he got best results years ago with Ambien and high dose Trazadone.   He has been on multil;e antidepressants in past also.  He did not keep f/u appt with Amy as directed after last summer.   No problem-specific Assessment & Plan notes found for this encounter.   Past Medical History:  Diagnosis Date  . Anxiety   . Chronic pain   . DDD (degenerative disc disease)   . Hyperlipidemia   . Hypertension   . Insomnia   . Sinus congestion     Past Surgical History:  Procedure Laterality Date  . CERVICAL FUSION  2004  . CHOLECYSTECTOMY  2007  . neck and disc replacement    . SHOULDER ARTHROSCOPY WITH ROTATOR CUFF REPAIR AND SUBACROMIAL DECOMPRESSION Left 01/29/2013   Procedure: LEFT SHOULDER ARTHROSCOPY WITH ROTATOR CUFF REPAIR AND SUBACROMIAL DECOMPRESSION DISTAL CLAVICLE RESECTION;  Surgeon: Mable ParisJustin William Chandler, MD;  Location: [facility];  Service: Orthopedics;  Laterality: Left;    Family History  Problem Relation Age of Onset  . Diabetes Mother   . Stroke Father     Social History   Social History  . Marital status: Married    Spouse name: N/A  . Number of children: N/A  . Years of education: N/A   Occupational History  . Not on file.   Social History Main Topics  . Smoking status: Never Smoker  . Smokeless tobacco: Current User    Types: Chew  . Alcohol use Yes     Comment: occ  . Drug use: No  . Sexual activity: Not on file   Other Topics Concern  . Not on file   Social History Narrative  . No narrative on file     Current Outpatient Prescriptions:  .  amLODipine (NORVASC) 5 MG tablet, Take 1 tablet (5 mg total) by mouth  daily., Disp: 30 tablet, Rfl: 11 .  gabapentin (NEURONTIN) 300 MG capsule, Take 300 mg by mouth 3 (three) times daily., Disp: , Rfl:  .  olmesartan-hydrochlorothiazide (BENICAR HCT) 20-12.5 MG tablet, Take 1 tablet by mouth daily. Reported on 07/23/2015, Disp: 30 tablet, Rfl: 11 .  ondansetron (ZOFRAN) 4 MG tablet, Take 1 tablet (4 mg total) by mouth daily as needed for nausea or vomiting., Disp: 10 tablet, Rfl: 0 .  oxyCODONE (ROXICODONE) 15 MG immediate release tablet, Take 15 mg by mouth 4 (four) times daily as needed for pain., Disp: , Rfl:  .  oxyCODONE-acetaminophen (PERCOCET) 10-325 MG tablet, Take 1 tablet by mouth daily., Disp: , Rfl:  .  oxymetazoline (AFRIN) 0.05 % nasal spray, Place 2 sprays into the nose 2 (two) times daily as needed. Reported on 07/23/2015, Disp: , Rfl:  .  sildenafil (REVATIO) 20 MG tablet, Take 3-5 pills as needed 30 minute prior to sex., Disp: 30 tablet, Rfl: 0 .  amitriptyline (ELAVIL) 25 MG tablet, Take 1 tablet (25 mg total) by mouth at bedtime., Disp: 30 tablet, Rfl: 1 .  hydrOXYzine (VISTARIL) 25 MG capsule, Take 1 capsule (25 mg total) by mouth at bedtime as needed (sleep)., Disp:  30 capsule, Rfl: 1  Allergies  Allergen Reactions  . Bee Venom Shortness Of Breath     Review of Systems  Constitutional: Negative for chills, fever, malaise/fatigue and weight loss.  HENT: Negative for hearing loss.   Eyes: Negative for blurred vision and double vision.  Respiratory: Negative for cough, hemoptysis and wheezing.   Cardiovascular: Negative for chest pain, palpitations and leg swelling.  Gastrointestinal: Negative for abdominal pain, blood in stool and heartburn.  Genitourinary: Negative for dysuria, frequency and urgency.  Skin: Negative for rash.  Neurological: Negative for dizziness, tremors, weakness and headaches.  Psychiatric/Behavioral: The patient has insomnia.       Objective  Vitals:   03/26/16 1031  BP: (!) 145/99  Pulse: 94  Resp: 16   Temp: 98.8 F (37.1 C)  TempSrc: Oral  Weight: 179 lb (81.2 kg)  Height: 5\' 9"  (1.753 m)    Physical Exam  Constitutional: He is oriented to person, place, and time and well-developed, well-nourished, and in no distress. No distress.  HENT:  Head: Normocephalic and atraumatic.  Eyes: Conjunctivae and EOM are normal. Pupils are equal, round, and reactive to light. No scleral icterus.  Neck: Normal range of motion. Neck supple. No thyromegaly present.  Cardiovascular: Normal rate, regular rhythm and normal heart sounds.  Exam reveals no gallop and no friction rub.   No murmur heard. Pulmonary/Chest: Effort normal and breath sounds normal. No respiratory distress. He has no wheezes. He has no rales.  Abdominal: Soft. Bowel sounds are normal. He exhibits no distension and no mass. There is no tenderness.  Musculoskeletal: He exhibits no edema.  Lymphadenopathy:    He has no cervical adenopathy.  Neurological: He is alert and oriented to person, place, and time.  Psychiatric: Affect normal.  Vitals reviewed.      No results found for this or any previous visit (from the past 2160 hour(s)).   Assessment & Plan  Problem List Items Addressed This Visit      Other   Insomnia - Primary   Relevant Medications   hydrOXYzine (VISTARIL) 25 MG capsule   amitriptyline (ELAVIL) 25 MG tablet    Other Visit Diagnoses    Immunization due       Relevant Orders   Flu Vaccine QUAD 36+ mos PF IM (Fluarix & Fluzone Quad PF)      Meds ordered this encounter  Medications  . hydrOXYzine (VISTARIL) 25 MG capsule    Sig: Take 1 capsule (25 mg total) by mouth at bedtime as needed (sleep).    Dispense:  30 capsule    Refill:  1  . amitriptyline (ELAVIL) 25 MG tablet    Sig: Take 1 tablet (25 mg total) by mouth at bedtime.    Dispense:  30 tablet    Refill:  1   1. Insomnia  - hydrOXYzine (VISTARIL) 25 MG capsule; Take 1 capsule (25 mg total) by mouth at bedtime as needed (sleep).   Dispense: 30 capsule; Refill: 1 - amitriptyline (ELAVIL) 25 MG tablet; Take 1 tablet (25 mg total) by mouth at bedtime.  Dispense: 30 tablet; Refill: 1

## 2016-04-08 ENCOUNTER — Ambulatory Visit: Payer: Managed Care, Other (non HMO) | Admitting: Family Medicine

## 2016-05-25 ENCOUNTER — Encounter: Payer: Self-pay | Admitting: Family Medicine

## 2016-05-25 ENCOUNTER — Ambulatory Visit (INDEPENDENT_AMBULATORY_CARE_PROVIDER_SITE_OTHER): Payer: Managed Care, Other (non HMO) | Admitting: Family Medicine

## 2016-05-25 VITALS — BP 137/88 | HR 100 | Temp 98.7°F | Resp 16 | Ht 69.0 in | Wt 185.0 lb

## 2016-05-25 DIAGNOSIS — N529 Male erectile dysfunction, unspecified: Secondary | ICD-10-CM | POA: Diagnosis not present

## 2016-05-25 DIAGNOSIS — F5101 Primary insomnia: Secondary | ICD-10-CM | POA: Diagnosis not present

## 2016-05-25 MED ORDER — AMITRIPTYLINE HCL 50 MG PO TABS: 50.0000 mg | ORAL_TABLET | Freq: Every day | ORAL | 5 refills | Status: DC

## 2016-05-25 MED ORDER — HYDROXYZINE PAMOATE 25 MG PO CAPS: 25.0000 mg | ORAL_CAPSULE | Freq: Every evening | ORAL | 3 refills | Status: DC | PRN

## 2016-05-25 MED ORDER — SILDENAFIL CITRATE 20 MG PO TABS: ORAL_TABLET | ORAL | 5 refills | Status: DC

## 2016-05-25 NOTE — Assessment & Plan Note (Signed)
Stable ED, unclear exact etiology, seems likely organic vs age vs med side effect, history not suggestive of psychological or reduced sexual drive - Refill sildenafil generic #30, take 3-5 tabs 30 min prior to sex PRN, +5 refills, printed given to patient to return to [facility] - Future follow-up blood work with chemistry, lipids, A1c, evaluate possible underlying organic cause, may need referral to Urology if not improving

## 2016-05-25 NOTE — Progress Notes (Signed)
Subjective:    Patient ID: Brett Huber, male    DOB: 05/26/1966, 50 y.o.   MRN: [id number]  Brett Huber is a 50 y.o. male presenting on 05/25/2016 for Insomnia   HPI   FOLLOW-UP, INSOMNIA, chronic, primary - Last visit 03/26/16 seen by Dr Juanetta GoslingHawkins [facility] at [facility], for same complaint of insomnia - Chronic history of insomnia with intermittent worsening over past >30 years. Reports thought to have primary insomnia without any specific underlying cause, possibly anxiety with difficulty "shutting off mind", was told in past he had "severe insomnia", chronically has difficulty initiating sleep onset and sleep maintenance, if he wakes up then unable to fall back asleep, during worsening spells he can go several days without sleep. [facility] medicine [facility], has had multiple sleep studies, no diagnosis of OSA not on CPAP, no other sleep disorders diagnosed. - Today reports he did well for 1 month on new medications (Amitrtypline 25mg  and Hydroxyzine 25mg ) he was getting up to 6-7 hours of sleep initially, then worsening over past 1 month getting only 3-4 hours of sleep - Still taking melatonin 5 up to 10-20mg  as needed few days a week, also taking Advil PM on occasion - Currently he will attempt to go to sleep around 0100-0200, tries to adhere to sleep hygiene - Prior history of multiple medication failures and mixed results including Trazodone >150mg , Zoloft (recently 11/2015 for anxiety/depression but did not help sleep), has had some improvement on Belsomra in past but required higher doses 20mg +, prior history of improvement on ambien usually in combination with trazodone, and then as tried xanax with trazodone, if used one treatment for too long body would get used to it - Denies any symptoms of depression with depressed mood, reduced energy or motivation, change in appetite. 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 Initially tried Cialis with good results, but unable to continue due to cost. Additionally states in past ED symptoms improved with anti-HTN therapy but then worsened again. He was diagnosed as Pre-Diabetic with some abnormal blood sugar readings in past. Denies any reduced sex desire or drive, states he is sexually active with his wife who is about 15 years younger. - Requesting refill generic viagra printed to bring to discount pharmacy, takes 3 to 5 tabs per use only as needed - Has not established with Urology in the past, but will consider this if needed  Social History  Substance Use Topics  . Smoking status: Never Smoker  . Smokeless tobacco: Current User    Types: Chew  . Alcohol use Yes     Comment: occ    Review of Systems Per HPI unless specifically indicated above     Objective:    BP 137/88   Pulse 100   Temp 98.7 F (37.1 C) (Oral)   Resp 16   Ht 5\' 9"  (1.753 m)   Wt 185 lb (83.9 kg)   BMI 27.32 kg/m   Wt Readings from Last 3 Encounters:  05/25/16 185 lb (83.9  kg)  03/26/16 179 lb (81.2 kg)  12/02/15 185 lb (83.9 kg)    Physical Exam  Constitutional: He is oriented to person, place, and time. He appears well-developed and well-nourished. No distress.  Well-appearing, comfortable, cooperative  HENT:  Head: Normocephalic and atraumatic.  Dry mucus mem  Cardiovascular: Intact distal pulses.   Tachycardic, improved on re-check manual  Pulmonary/Chest: Effort normal.  Neurological: He is alert and oriented to  person, place, and time.  Skin: Skin is warm and dry. He is not diaphoretic.  Psychiatric: He has a normal mood and affect. His behavior is normal.  Slightly anxious appearing but very cooperative, well groomed, normal speech and thought  Nursing note and vitals reviewed.   I have personally reviewed the following lab results from 07/2015.  Results for orders placed or performed during the hospital encounter of 08/12/15  Lipase, blood  Result Value Ref Range   Lipase 22 11 - 51 U/L  Comprehensive metabolic panel  Result Value Ref Range   Sodium 137 135 - 145 mmol/L   Potassium 3.7 3.5 - 5.1 mmol/L   Chloride 102 101 - 111 mmol/L   CO2 25 22 - 32 mmol/L   Glucose, Bld 126 (H) 65 - 99 mg/dL   BUN 17 6 - 20 mg/dL   Creatinine, Ser 9.601.52 (H) 0.61 - 1.24 mg/dL   Calcium 9.6 8.9 - 45.410.3 mg/dL   Total Protein 8.2 (H) 6.5 - 8.1 g/dL   Albumin 4.1 3.5 - 5.0 g/dL   AST 28 15 - 41 U/L   ALT 17 17 - 63 U/L   Alkaline Phosphatase 52 38 - 126 U/L   Total Bilirubin 0.7 0.3 - 1.2 mg/dL   GFR calc non Af Amer 51 (L) >60 mL/min   GFR calc Af Amer 60 (L) >60 mL/min   Anion gap 10 5 - 15  CBC  Result Value Ref Range   WBC 10.5 3.8 - 10.6 K/uL   RBC 6.08 (H) 4.40 - 5.90 MIL/uL   Hemoglobin 17.0 13.0 - 18.0 g/dL   HCT 09.849.1 11.940.0 - 14.752.0 %   MCV 80.7 80.0 - 100.0 fL   MCH 28.0 26.0 - 34.0 pg   MCHC 34.6 32.0 - 36.0 g/dL   RDW 82.914.6 (H) 56.211.5 - 13.014.5 %   Platelets 360 150 - 440 K/uL      Assessment & Plan:   Problem List Items Addressed This Visit    Insomnia - Primary    Improved then gradual worsening again over past 1 month, now on Amitriptyline and Hydroxyzine, still taking melatonin occasional advil-PM. Chronic insomnia >30 years, likely some underlying anxiety / mood disorder, but no other diagnosed sleep disorder, concern with current 2nd shift job - Off of belsomra (required high doses limited improvement), prior history of trials on trazodone >150mg , Ambien, xanax, mixed results, prior  discontinued by prescriber preference and concern with polypharmacy already on high dose chronic opiates per Pain Management - Checked [place] CSRS today for 1 year results 05/26/2015 - 2017, appropriate controlled prescribing by pain management as described by patient, prior use of belsomra, no red flags or other prescribers  Plan: 1. Increase Amitriptyline from 25 to 50mg  nightly now, future may consider gradual titration up to 75 then 100mg  max dose, counseled on side effects and risks especially with higher dose with cardiac effects and arrhythmia, otherwise now with some dry mouth, can try fish oil for this 2. Increase hydroxyzine to 25-50mg  nightly, stop Advil PM, wait  1-2 weeks before increase to hydroxyzine 50mg  to see how amitriptyline 50 works. 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 Continue melatonin as needed 4. Reviewed sleep hygiene, handout given 5. Follow-up 3 months, future consider intermittent ambien if unable to control on other medications, since limited options, also may switch to nortriptyline if too many side effects on amitriptyline      Relevant Medications   amitriptyline (ELAVIL) 50 MG tablet   hydrOXYzine (VISTARIL) 25 MG capsule   Erectile dysfunction    Stable ED, unclear exact etiology, seems likely organic vs age vs med side effect, history not suggestive of psychological or reduced sexual drive - Refill sildenafil generic #30, take 3-5 tabs 30 min prior to sex PRN, +5 refills, printed given to patient to return to discount pharmacy at [facility] - Future follow-up blood work with chemistry, lipids, A1c, evaluate possible underlying organic cause, may need referral to Urology if not improving      Relevant Medications   sildenafil (REVATIO) 20 MG tablet      Meds ordered this encounter  Medications  .  amitriptyline (ELAVIL) 50 MG tablet    Sig: Take 1 tablet (50 mg total) by mouth at bedtime.    Dispense:  30 tablet    Refill:  5  . hydrOXYzine (VISTARIL) 25 MG capsule    Sig: Take 1-2 capsules (25-50 mg total) by mouth at bedtime as needed (sleep).    Dispense:  60 capsule    Refill:  3  . sildenafil (REVATIO) 20 MG tablet    Sig: Take 3-5 pills as needed 30 minute prior to sex.    Dispense:  30 tablet    Refill:  5      Follow up plan: Return in about 3 months (around 08/25/2016) for insomnia.  Saralyn Pilar, DO [facility] 05/25/2016, 12:07 PM

## 2016-05-25 NOTE — Assessment & Plan Note (Signed)
Improved then gradual worsening again over past 1 month, now on Amitriptyline and Hydroxyzine, still taking melatonin occasional advil-PM. 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 Increase Amitriptyline from 25 to 50mg  nightly now, future may consider gradual titration up to 75 then 100mg  max dose, counseled on side effects and risks especially with higher dose with cardiac effects and arrhythmia, otherwise now with some dry mouth, can try fish oil for this 2. Increase hydroxyzine to 25-50mg  nightly, stop Advil PM, wait 1-2 weeks before increase to hydroxyzine 50mg  to see how amitriptyline 50 works. 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 Continue melatonin as needed 4. Reviewed sleep hygiene, handout given 5. Follow-up 3 months, future consider intermittent ambien if unable to control on other medications, since limited options, also may switch to nortriptyline if too many side effects on amitriptyline

## 2016-05-25 NOTE — Patient Instructions (Signed)
Thank you for coming in to clinic today.  1. For your insomnia - Start taking Amitriptyline 50mg  tabs nightly, may take days to week before difference. Side effects include dry mouth, constipation., may try fish oil for dry mouth. - Continue hydroxyzine 25mg  nightly for first 1 week, to see how amitriptyline works, if needed increase to 2 capsules for 50mg  at night, this med is the benadryl cousin  Caution with advil PM when taking higher dose hydroxyzine  Rx printed for Viagra generic, we may refer to Urologist in future if needed  Sleep Hygiene Recommendations to promote healthy sleep in all patients, especially if symptoms of insomnia are worsening. Due to the nature of sleep rhythms, if your body gets "out of rhythm", it may take some time before your sleep cycle can be "reset".  Please try to follow as many of the following tips as you can, usually there are only a few of these are the primary cause of the problem.  ?To reset your sleep rhythm, go to bed and get up at the same time every day ?Sleep only long enough to feel rested and then get out of bed ?Do not try to force yourself to sleep. If you can't sleep, get out of bed and try again later.  ?Have coffee, tea, and other foods that have caffeine only in the morning ?Exercise several days a week, but not right before bed ?If you drink alcohol, avoid alcohol in the late afternoon, evening, and bedtime ?If you smoke, avoid smoking, especially in the evening  ?Avoid watching TV or looking at phones, computers, or reading devices ("e-books") that give off light at least 30 minutes before bed. This artificial light sends "awake signals" to your brain and can make it harder to fall asleep. ?Keep your bedroom dark, cool, quiet, and free of reminders of other things that cause you stress ?Try your best to solve or at least address your problems before you go to bed  Follow-up as scheduled, fasting for labs.  Follow-up 3 months for  insomnia  If you have any other questions or concerns, please feel free to call the clinic or send a message through MyChart. You may also schedule an earlier appointment if necessary.  Saralyn [name] Karamalegos, DO [facility]

## 2016-06-04 ENCOUNTER — Ambulatory Visit (INDEPENDENT_AMBULATORY_CARE_PROVIDER_SITE_OTHER): Payer: Managed Care, Other (non HMO) | Admitting: Family Medicine

## 2016-06-04 ENCOUNTER — Encounter: Payer: Self-pay | Admitting: Family Medicine

## 2016-06-04 VITALS — BP 106/63 | HR 88 | Temp 98.2°F | Resp 16 | Ht 69.0 in | Wt 188.0 lb

## 2016-06-04 DIAGNOSIS — F329 Major depressive disorder, single episode, unspecified: Secondary | ICD-10-CM

## 2016-06-04 DIAGNOSIS — E559 Vitamin D deficiency, unspecified: Secondary | ICD-10-CM

## 2016-06-04 DIAGNOSIS — Z0000 Encounter for general adult medical examination without abnormal findings: Secondary | ICD-10-CM | POA: Diagnosis not present

## 2016-06-04 DIAGNOSIS — R7309 Other abnormal glucose: Secondary | ICD-10-CM | POA: Diagnosis not present

## 2016-06-04 DIAGNOSIS — F419 Anxiety disorder, unspecified: Secondary | ICD-10-CM

## 2016-06-04 DIAGNOSIS — Z114 Encounter for screening for human immunodeficiency virus [HIV]: Secondary | ICD-10-CM | POA: Diagnosis not present

## 2016-06-04 DIAGNOSIS — J302 Other seasonal allergic rhinitis: Secondary | ICD-10-CM

## 2016-06-04 DIAGNOSIS — G894 Chronic pain syndrome: Secondary | ICD-10-CM | POA: Diagnosis not present

## 2016-06-04 DIAGNOSIS — E782 Mixed hyperlipidemia: Secondary | ICD-10-CM

## 2016-06-04 DIAGNOSIS — Z1211 Encounter for screening for malignant neoplasm of colon: Secondary | ICD-10-CM | POA: Insufficient documentation

## 2016-06-04 DIAGNOSIS — I10 Essential (primary) hypertension: Secondary | ICD-10-CM | POA: Diagnosis not present

## 2016-06-04 DIAGNOSIS — F32A Depression, unspecified: Secondary | ICD-10-CM

## 2016-06-04 DIAGNOSIS — F5101 Primary insomnia: Secondary | ICD-10-CM

## 2016-06-04 DIAGNOSIS — F418 Other specified anxiety disorders: Secondary | ICD-10-CM

## 2016-06-04 MED ORDER — FLUTICASONE PROPIONATE 50 MCG/ACT NA SUSP: 2.0000 | Freq: Every day | NASAL | 1 refills | Status: DC

## 2016-06-04 NOTE — Assessment & Plan Note (Signed)
Due for routine colon cancer screening. Never had colonoscopy (not interested), no family history colon cancer. - Discussion today about recommendations for either Colonoscopy or Cologuard screening, benefits and risks of screening, interested in Cologuard, understands that if positive then recommendation is for diagnostic colonoscopy to follow-up. - Ordered Cologuard today, he will contact ins as well to check cost - Prior order of cologuard from our office never was shipped out, unsure why

## 2016-06-04 NOTE — Assessment & Plan Note (Signed)
History of HLD but no recent lab values, had outside labs for work screening but non fasting and does not have available No current treatment, never on statin Limited lifestyle modifications currently Check fasting lipid panel today, follow-up results, ASCVD risk

## 2016-06-04 NOTE — Assessment & Plan Note (Signed)
History of deficiency, unsure last lab value Check Vitamin D today, never got lab draw in 06/2015 Advised will need Vitamin D3 OTC 2,000iu daily for maintenance, pending result of Vit D may need treatment dose, will notify patient

## 2016-06-04 NOTE — Assessment & Plan Note (Signed)
Stable, followed by Pain Management, Dr Regenia Skeeterogra

## 2016-06-04 NOTE — Assessment & Plan Note (Signed)
Several elevated glucose in past non fasting >120+ No prior A1c on chart. Fam history of DM Check A1c today, and fasting sugar

## 2016-06-04 NOTE — Progress Notes (Signed)
Subjective:    Patient ID: Brett Huber, male    DOB: 1965/10/13, 50 y.o.   MRN: [id number]  Brett Huber is a 50 y.o. male presenting on 06/04/2016 for Annual Exam   HPI   Annual physical exam, for [employer]. Did blood work but was non fasting for work outside lab, no results available with him currently. Requests new fasting labs done today.  FOLLOW-UP, INSOMNIA, chronic, primary - Last visit 05/25/16 with me at [facility] for long-term insomnia, see note for details on background information. He was increased on his Amitriptyline from 25mg  nightly to 50mg  tabs, and given titration instructions on if needed could go up to 1.5 tabs to max 2 tabs in future. Also given Hydroxyzine 25-50mg  nightly PRN. - Today he reports significant improvement with his sleep on increased dosage of these 2 medications, he is now taking Amitriptyline 50mg  (has not needed to increase any further) and Hydroxyzine 2 caps for 50mg  about 15 min after arriving home from work, and is able to fall asleep easily now, much improved, sleeps better through the night gets about 5+ hours of sleep. Previously was only getting a couple of hours at a time. 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 Even recently he had health screen at work but unsure lab results, not fasting. Today he is fasting and  wants repeat blood work. - Admits family history of Diabetes with mother - Admits some weight gain past few months +5-7 lbs - Not following any particular diet currently does admit to drinking sugary drinks with sweet tea, energy drinks, limited water - No regular exercise, but does have physical job. Plans to do more walking - History of elevated Creatinine up to 1.5 in 07/2015 but states he was acutely dehydrated and in hospital at that time  CHRONIC HTN: Reports no concerns, has been stable recently Current Meds - Amlodipine 5mg  daily, Olmesartan-HCTZ 20-12.5mg  daily   Reports good compliance, took meds today. Tolerating well, w/o complaints.  Vitamin D Deficiency: - Reports prior history of low vitamin D, no lab value available in record, he was treated with vitamin D3 supplement in past. No longer taking.  ALLERGIC RHINITIS / POST NASAL DRIP - Reports recent URI vs allergies now seems to be improving over past few weeks, still has some throat irritation. 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 Interested in Cologuard today, would like [place] to go ahead and attempt to order it. In past previous provider attempted to order Cologuard twice without success.  Social History  Substance Use Topics  . Smoking status:  Never Smoker  . Smokeless tobacco: Current User    Types: Chew  . Alcohol use Yes     Comment: occ    Review of Systems  Constitutional: Negative for activity change, appetite change, chills, diaphoresis, fatigue and fever.  HENT: Positive for postnasal drip. Negative for congestion, hearing loss, rhinorrhea, sinus pain and sinus pressure.     Eyes: Negative for visual disturbance.  Respiratory: Negative for cough, chest tightness, shortness of breath and wheezing.   Cardiovascular: Negative for chest pain, palpitations and leg swelling.  Gastrointestinal: Negative for abdominal pain, constipation, diarrhea, nausea and vomiting.  Endocrine: Negative for cold intolerance, polydipsia and polyuria.  Genitourinary: Negative for dysuria, frequency and hematuria.  Musculoskeletal: Positive for back pain (chronic). Negative for arthralgias and neck pain.  Skin: Negative for rash.  Allergic/Immunologic: Positive for environmental allergies.  Neurological: Negative for dizziness, weakness, light-headedness, numbness and headaches.  Hematological: Negative for adenopathy.  Psychiatric/Behavioral: Positive for sleep disturbance (improved). Negative for agitation, behavioral problems, decreased concentration, dysphoric mood, hallucinations, self-injury and suicidal ideas. The patient is not hyperactive.    Per HPI unless specifically indicated above     Objective:    BP 106/63 (BP Location: Left Arm, Patient Position: Sitting, Cuff Size: Normal)   Pulse 88   Temp 98.2 F (36.8 C) (Oral)   Resp 16   Ht 5\' 9"  (1.753 m)   Wt 188 lb (85.3 kg)   BMI 27.76 kg/m   Wt Readings from Last 3 Encounters:  06/04/16 188 lb (85.3 kg)  05/25/16 185 lb (83.9 kg)  03/26/16 179 lb (81.2 kg)    Physical Exam  Constitutional: He is oriented to person, place, and time. He appears well-developed and well-nourished. No distress.  Well-appearing, comfortable, cooperative  HENT:  Head: Normocephalic and atraumatic.  Frontal / maxillary sinuses non-tender. Nares patent with mild turbinate edema some congestion without purulence. Bilateral TMs clear without erythema, effusion or bulging. Oropharynx with some postnasal drainage and mild erythema without exudates, edema or asymmetry.  Eyes: Conjunctivae and EOM are normal. Pupils are equal, round, and  reactive to light. Right eye exhibits no discharge. Left eye exhibits no discharge.  Neck: Normal range of motion. Neck supple. No thyromegaly present.  Cardiovascular: Normal rate, regular rhythm, normal heart sounds and intact distal pulses.   No murmur heard. Pulmonary/Chest: Effort normal and breath sounds normal. No respiratory distress. He has no wheezes. He has no rales.  Abdominal: Soft. Bowel sounds are normal. He exhibits no distension and no mass. There is no tenderness.  Musculoskeletal: Normal range of motion. He exhibits no edema or tenderness.  Lymphadenopathy:    He has no cervical adenopathy.  Neurological: He is alert and oriented to person, place, and time.  Skin: Skin is warm and dry. He is not diaphoretic.  Psychiatric: He has a normal mood and affect. His behavior is normal.  Well groomed, good eye contact, loud but normal speech, normal speech and thought, seems to have some excessive movements frequently will get up during interview and use hand sanitizer occasionally  Nursing note and vitals reviewed.  [facility] 06/04/2016 12/02/2015 07/23/2015  Decreased Interest 0 1 2  Down, Depressed, Hopeless 0 0 2  PHQ - 2 Score 0 1 4  Altered sleeping - 3 3  Tired, decreased energy - 2 3  Change in appetite - 0 3  Feeling bad or failure about yourself  - 0 2  Trouble concentrating - 0 1  Moving slowly  or fidgety/restless - 1 0  Suicidal thoughts - 0 1  PHQ-9 Score - 7 17  Difficult doing work/chores - - Somewhat difficult     I have personally reviewed the following lab results from 07/2015.  Results for orders placed or performed during the hospital encounter of 08/12/15  Lipase, blood  Result Value Ref Range   Lipase 22 11 - 51 U/L  Comprehensive metabolic panel  Result Value Ref Range   Sodium 137 135 - 145 mmol/L   Potassium 3.7 3.5 - 5.1 mmol/L   Chloride 102 101 - 111 mmol/L   CO2 25 22 - 32 mmol/L   Glucose, Bld 126 (H) 65 - 99 mg/dL   BUN  17 6 - 20 mg/dL   Creatinine, Ser 4.131.52 (H) 0.61 - 1.24 mg/dL   Calcium 9.6 8.9 - 24.410.3 mg/dL   Total Protein 8.2 (H) 6.5 - 8.1 g/dL   Albumin 4.1 3.5 - 5.0 g/dL   AST 28 15 - 41 U/L   ALT 17 17 - 63 U/L   Alkaline Phosphatase 52 38 - 126 U/L   Total Bilirubin 0.7 0.3 - 1.2 mg/dL   GFR calc non Af Amer 51 (L) >60 mL/min   GFR calc Af Amer 60 (L) >60 mL/min   Anion gap 10 5 - 15  CBC  Result Value Ref Range   WBC 10.5 3.8 - 10.6 K/uL   RBC 6.08 (H) 4.40 - 5.90 MIL/uL   Hemoglobin 17.0 13.0 - 18.0 g/dL   HCT 01.049.1 27.240.0 - 53.652.0 %   MCV 80.7 80.0 - 100.0 fL   MCH 28.0 26.0 - 34.0 pg   MCHC 34.6 32.0 - 36.0 g/dL   RDW 64.414.6 (H) 03.411.5 - 74.214.5 %   Platelets 360 150 - 440 K/uL      Assessment & Plan:   Problem List Items Addressed This Visit    Vitamin D deficiency    History of deficiency, unsure last lab value Check Vitamin D today, never got lab draw in 06/2015 Advised will need Vitamin D3 OTC 2,000iu daily for maintenance, pending result of Vit D may need treatment dose, will notify patient      Relevant Orders   VITAMIN D 25 Hydroxy (Vit-D Deficiency, Fractures)   Insomnia    Significant improved now on higher dose Amitriptyline 50mg  nightly with hydroxyzine 50mg  nightly, 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  Plan: 1. Continue current dose Amitriptyline 50mg  nightly, reviewed possibility of titration in the future up to 75 to 100mg  nightly, hold for now 2. Continue Hydroxyzine 50mg  nightly, but advised this can be PRN 3. Continue melatonin as needed 4. Follow-up 3 months in future, consider intermittent ambien if unable to control in future only temporary      Relevant Orders   TSH   Hypertension    Well-controlled HTN No complications - did have prior elevated Cr, thought due to acute dehydration 07/2015   Plan:  1. Continue current BP  meds.  2. Check BMET, follow serum Cr, ensure resolution of prior elevated Cr 3. Lifestyle Mods - Exercise, Dec salt intake, inc K+ rich vegs 4. Monitor BP at home or at drug store occasionally.       Relevant Orders   BASIC METABOLIC PANEL WITH GFR   Hyperlipidemia    History of HLD but no recent lab values, had outside labs for work screening  but non fasting and does not have available No current treatment, never on statin Limited lifestyle modifications currently Check fasting lipid panel today, follow-up results, ASCVD risk      Relevant Orders   Lipid panel   TSH   Colon cancer screening    Due for routine colon cancer screening. Never had colonoscopy (not interested), no family history colon cancer. - Discussion today about recommendations for either Colonoscopy or Cologuard screening, benefits and risks of screening, interested in Cologuard, understands that if positive then recommendation is for diagnostic colonoscopy to follow-up. - Ordered Cologuard today, he will contact ins as well to check cost - Prior order of cologuard from our office never was shipped out, unsure why      Relevant Orders   Cologuard   Chronic pain    Stable, followed by Pain Management, Dr Regenia Skeeter      Anxiety and depression    Remains well controlled on current TCA with Amitriptyline low dose for insomnia, currently off of SSRI intolerance to Zoloft, no other meds tried recently - PHQ2 score 0  Plan: 1. Follow-up 3 months repeat PHQ, discuss underlying causes contributing to insomnia, however still seems mostly primary insomnia      Relevant Orders   TSH   Abnormal glucose    Several elevated glucose in past non fasting >120+ No prior A1c on chart. Fam history of DM Check A1c today, and fasting sugar      Relevant Orders   Hemoglobin A1c    Other Visit Diagnoses    Annual physical exam    -  Primary   Relevant Orders   BASIC METABOLIC PANEL WITH GFR   Lipid panel   Screening for  HIV (human immunodeficiency virus)       Relevant Orders   HIV antibody   Acute seasonal allergic rhinitis due to other allergen       Trial on flonase   Relevant Medications   fluticasone (FLONASE) 50 MCG/ACT nasal spray      Meds ordered this encounter  Medications  . fluticasone (FLONASE) 50 MCG/ACT nasal spray    Sig: Place 2 sprays into both nostrils daily. Use for 4-6 weeks then stop and use seasonally or as needed.    Dispense:  16 g    Refill:  1      Follow up plan: Return in about 3 months (around 09/02/2016) for insomnia.  Saralyn Pilar, DO [facility] 06/04/2016, 1:03 PM

## 2016-06-04 NOTE — Assessment & Plan Note (Signed)
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  Plan: 1. Continue current dose Amitriptyline 50mg  nightly, reviewed possibility of titration in the future up to 75 to 100mg  nightly, hold for now 2. Continue Hydroxyzine 50mg  nightly, but advised this can be PRN 3. Continue melatonin as needed 4. Follow-up 3 months in future, consider intermittent ambien if unable to control in future only temporary

## 2016-06-04 NOTE — Patient Instructions (Addendum)
Thank you for coming in to clinic today.  1. We will check fasting labs today (chemistry, kidney function, sugar, A1c, HIV screen, Vitamin D, and thyroid function) results through MyChart by next week  2. Start working on improving healthy diet, but can focus on reducing sugary drinks, limit sweet tea and energy drinks, try to drink more water.  3. Can start taking Vitamin D3 2,000 units daily maintenance dose (good supplement to take year round), otherwise you can WAIT until lab results are ready and we may do a HIGHER dose if you need actual treatment and not just maintenance dose  4. Continue same medications for sleep, let me know if you have issues  The 5 Minute Rule of Exercise - Promise yourself to at least do 5 minutes of exercise (make sure you time it), and if at the end of 5 minutes (this is the hardest part of the work-out), if you still feel like you want stop (or not motivated to continue) then allow yourself to stop. Otherwise, more often than not you will feel encouraged that you can continue for a little while longer or even more!  Diet Recommendations for Low Carb Diet   Avoid Starchy (carb) foods include: Bread, rice, pasta, potatoes, corn, crackers, bagels, muffins, all baked goods.   Protein foods include: Meat, fish, poultry, eggs, dairy foods, and beans such as pinto and kidney beans (beans also provide carbohydrate).   1. Eat at least 3 meals and 1-2 snacks per day. Never go more than 4-5 hours while awake without eating.   2. Limit starchy foods to TWO per meal and ONE per snack. ONE portion of a starchy  food is equal to the following:   - ONE slice of bread (or its equivalent, such as half of a hamburger bun).   - 1/2 cup of a "scoopable" starchy food such as potatoes or rice.   - 1 OUNCE (28 grams) of starchy snacks (crackers or pretzels, look on label).   - 15 grams of carbohydrate as shown on food label.   3. Both lunch and dinner should include a protein  food, a carb food, and vegetables.   - Obtain twice as many veg's as protein or carbohydrate foods for both lunch and dinner.   - Try to keep frozen veg's on hand for a quick vegetable serving.     - Fresh or frozen veg's are best.   4. Breakfast should always include protein.    Colon Cancer Screening: - For all adults age 28+ routine colon cancer screening is highly recommended. - Early detection of colon cancer is important, because often there are no warning signs or symptoms, also if found early usually it can be cured. Late stage is hard to treat.  - If you are not interested in Colonoscopy screening (if done and normal you could be cleared for 5 to 10 years until next due), then Cologuard is an excellent alternative for screening test for Colon Cancer. It is highly sensitive for detecting DNA of colon cancer from even the earliest stages. Also, there is NO bowel prep required. - If Cologuard is NEGATIVE, then it is good for 3 years before next due - If Cologuard is POSITIVE, then it is strongly advised to get a Colonoscopy, which allows the GI doctor to locate the source of the cancer or polyp (even very early stage) and treat it by removing it. ------------------------- If you would like to proceed with Cologuard (stool DNA test) -  FIRST, call your insurance company and tell them you want to check cost of Cologuard tell them CPT Code [phone] (it may be completely covered and you could get for no cost, OR max cost without any coverage is about $600). Also, keep in mind if you do NOT open the kit, and decide not to do the test, you will NOT be charged, you should contact the company if you decide not to do the test.  I have already ordered the kit today.  The test kit will be delivered to you house within about 1 week. Follow instructions to collect sample, you may call the company for any help or questions, 24/7 telephone support at [phone].   Please schedule a follow-up  appointment with Dr. Parks Ranger in 3 to 6 months for follow-up Insomnia / Mood PHQ  If you have any other questions or concerns, please feel free to call the clinic or send a message through [place]. You may also schedule an earlier appointment if necessary.  Nobie Putnam, DO [place]

## 2016-06-04 NOTE — Assessment & Plan Note (Signed)
Well-controlled HTN No complications - did have prior elevated Cr, thought due to acute dehydration 07/2015   Plan:  1. Continue current BP meds.  2. Check BMET, follow serum Cr, ensure resolution of prior elevated Cr 3. Lifestyle Mods - Exercise, Dec salt intake, inc K+ rich vegs 4. Monitor BP at home or at drug store occasionally.

## 2016-06-04 NOTE — Assessment & Plan Note (Signed)
Remains well controlled on current TCA with Amitriptyline low dose for insomnia, currently off of SSRI intolerance to Zoloft, no other meds tried recently - PHQ2 score 0  Plan: 1. Follow-up 3 months repeat PHQ, discuss underlying causes contributing to insomnia, however still seems mostly primary insomnia

## 2016-06-05 LAB — BASIC METABOLIC PANEL WITH GFR
BUN: 20 mg/dL (ref 7–25)
CALCIUM: 9.2 mg/dL (ref 8.6–10.3)
CO2: 30 mmol/L (ref 20–31)
CREATININE: 3.08 mg/dL — AB (ref 0.70–1.33)
Chloride: 97 mmol/L — ABNORMAL LOW (ref 98–110)
GFR, Est African American: 26 mL/min — ABNORMAL LOW (ref >=60)
GFR, Est Non African American: 22 mL/min — ABNORMAL LOW (ref >=60)
GLUCOSE: 87 mg/dL (ref 65–99)
Potassium: 4 mmol/L (ref 3.5–5.3)
Sodium: 137 mmol/L (ref 135–146)

## 2016-06-05 LAB — LIPID PANEL
Cholesterol: 178 mg/dL (ref <200)
HDL: 42 mg/dL (ref >40)
LDL CALC: 104 mg/dL — AB (ref <100)
Total CHOL/HDL Ratio: 4.2 Ratio (ref <5.0)
Triglycerides: 161 mg/dL — ABNORMAL HIGH (ref <150)
VLDL: 32 mg/dL — ABNORMAL HIGH (ref <30)

## 2016-06-05 LAB — VITAMIN D 25 HYDROXY (VIT D DEFICIENCY, FRACTURES): VIT D 25 HYDROXY: 21 ng/mL — AB (ref 30–100)

## 2016-06-05 LAB — HIV ANTIBODY (ROUTINE TESTING W REFLEX): HIV: NONREACTIVE

## 2016-06-05 LAB — HEMOGLOBIN A1C
Hgb A1c MFr Bld: 5.6 % (ref <5.7)
MEAN PLASMA GLUCOSE: 114 mg/dL

## 2016-06-05 LAB — TSH: TSH: 3.78 mIU/L (ref 0.40–4.50)

## 2016-06-07 ENCOUNTER — Telehealth: Payer: Self-pay | Admitting: *Deleted

## 2016-06-07 NOTE — Telephone Encounter (Signed)
[facility]abs [facility]. Placed call re: new patient referral. Office hours 8:30-5:00 will call back.

## 2016-06-07 NOTE — Telephone Encounter (Signed)
Left detailed message lab results abnormal patient has appt scheduled with kidney specialist 06/08/16 [facility] in [place] with Dr. Cherylann RatelLateef at 1:30.

## 2016-06-07 NOTE — Telephone Encounter (Signed)
-----   Message from Brett CordsAlexander J Karamalegos, DO sent at 06/07/2016  8:18 AM EST ----- Regarding: Elevated Creatinine Can you please contact [facility] in [place] [phone] to check availability for urgent work in for significantly elevated Creatinine 3.00 (up from 1.5, 10 months ago), most likely dehydration is playing a role but I am concerned that this is not the only problem, and I don't have a clear cause for this (his HTN is controlled and he does not have Diabetes). He would be new patient.  Please check if they can get him in within next 1-3 days, otherwise see if they would recommend that he go directly to the ED for IV fluids and lab re-check.  Thanks.

## 2016-06-08 ENCOUNTER — Encounter: Payer: Self-pay | Admitting: Family Medicine

## 2016-06-08 MED ORDER — VITAMIN D3 125 MCG (5000 UT) PO CAPS: 5000.0000 [IU] | ORAL_CAPSULE | Freq: Every day | ORAL | 0 refills | Status: DC

## 2016-06-08 NOTE — Telephone Encounter (Signed)
UPDATE 06/08/16 Patient did follow-up with [facility] Dr Cherylann RatelLateef today. Received a phone update today 06/08/16 from Dr Cherylann RatelLateef regarding this patient's case. 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 Advsied to DC all NSAID. They are checking additional work-up as well with SPEP, UPEP, lab draws including ANA, and Renal US to eval for obstruction, will await on lab results and receive office notes in future. Follow-up as planned with Nephrology.  Brett [name] [name], DO [facility] 06/08/2016, 2:24 PM

## 2016-06-08 NOTE — Addendum Note (Signed)
Addended by: Smitty CordsKARAMALEGOS, Danniela Mcbrearty J on: 06/08/2016 01:19 PM   Modules accepted: Orders

## 2016-07-02 ENCOUNTER — Emergency Department: Payer: Managed Care, Other (non HMO)

## 2016-07-02 ENCOUNTER — Encounter: Payer: Self-pay | Admitting: *Deleted

## 2016-07-02 ENCOUNTER — Inpatient Hospital Stay
Admission: EM | Admit: 2016-07-02 | Discharge: 2016-07-05 | DRG: 682 | Disposition: A | Discharge status: Home / Self Care | Payer: Managed Care, Other (non HMO) | Attending: Internal Medicine | Admitting: Internal Medicine

## 2016-07-02 ENCOUNTER — Inpatient Hospital Stay: Payer: Managed Care, Other (non HMO)

## 2016-07-02 DIAGNOSIS — M542 Cervicalgia: Secondary | ICD-10-CM | POA: Diagnosis present

## 2016-07-02 DIAGNOSIS — G9349 Other encephalopathy: Secondary | ICD-10-CM

## 2016-07-02 DIAGNOSIS — R410 Disorientation, unspecified: Secondary | ICD-10-CM

## 2016-07-02 DIAGNOSIS — Z823 Family history of stroke: Secondary | ICD-10-CM | POA: Diagnosis not present

## 2016-07-02 DIAGNOSIS — R339 Retention of urine, unspecified: Secondary | ICD-10-CM | POA: Diagnosis present

## 2016-07-02 DIAGNOSIS — Z7951 Long term (current) use of inhaled steroids: Secondary | ICD-10-CM

## 2016-07-02 DIAGNOSIS — F17220 Nicotine dependence, chewing tobacco, uncomplicated: Secondary | ICD-10-CM | POA: Diagnosis present

## 2016-07-02 DIAGNOSIS — G9341 Metabolic encephalopathy: Secondary | ICD-10-CM | POA: Diagnosis not present

## 2016-07-02 DIAGNOSIS — R0902 Hypoxemia: Secondary | ICD-10-CM | POA: Diagnosis present

## 2016-07-02 DIAGNOSIS — E860 Dehydration: Secondary | ICD-10-CM | POA: Diagnosis present

## 2016-07-02 DIAGNOSIS — F5104 Psychophysiologic insomnia: Secondary | ICD-10-CM | POA: Diagnosis present

## 2016-07-02 DIAGNOSIS — Z833 Family history of diabetes mellitus: Secondary | ICD-10-CM

## 2016-07-02 DIAGNOSIS — R296 Repeated falls: Secondary | ICD-10-CM | POA: Diagnosis present

## 2016-07-02 DIAGNOSIS — N189 Chronic kidney disease, unspecified: Secondary | ICD-10-CM | POA: Diagnosis present

## 2016-07-02 DIAGNOSIS — Z981 Arthrodesis status: Secondary | ICD-10-CM | POA: Diagnosis not present

## 2016-07-02 DIAGNOSIS — J9811 Atelectasis: Secondary | ICD-10-CM | POA: Diagnosis present

## 2016-07-02 DIAGNOSIS — I129 Hypertensive chronic kidney disease with stage 1 through stage 4 chronic kidney disease, or unspecified chronic kidney disease: Secondary | ICD-10-CM | POA: Diagnosis present

## 2016-07-02 DIAGNOSIS — M6281 Muscle weakness (generalized): Secondary | ICD-10-CM

## 2016-07-02 DIAGNOSIS — G8929 Other chronic pain: Secondary | ICD-10-CM | POA: Diagnosis present

## 2016-07-02 DIAGNOSIS — N170 Acute kidney failure with tubular necrosis: Secondary | ICD-10-CM | POA: Diagnosis not present

## 2016-07-02 DIAGNOSIS — J189 Pneumonia, unspecified organism: Secondary | ICD-10-CM | POA: Diagnosis present

## 2016-07-02 DIAGNOSIS — Z79899 Other long term (current) drug therapy: Secondary | ICD-10-CM

## 2016-07-02 DIAGNOSIS — Z9049 Acquired absence of other specified parts of digestive tract: Secondary | ICD-10-CM

## 2016-07-02 DIAGNOSIS — N19 Unspecified kidney failure: Secondary | ICD-10-CM

## 2016-07-02 DIAGNOSIS — N179 Acute kidney failure, unspecified: Secondary | ICD-10-CM

## 2016-07-02 DIAGNOSIS — F05 Delirium due to known physiological condition: Secondary | ICD-10-CM | POA: Diagnosis not present

## 2016-07-02 DIAGNOSIS — F419 Anxiety disorder, unspecified: Secondary | ICD-10-CM | POA: Diagnosis not present

## 2016-07-02 DIAGNOSIS — N182 Chronic kidney disease, stage 2 (mild): Secondary | ICD-10-CM | POA: Diagnosis present

## 2016-07-02 DIAGNOSIS — N183 Chronic kidney disease, stage 3 unspecified: Secondary | ICD-10-CM | POA: Diagnosis present

## 2016-07-02 LAB — CBC WITH DIFFERENTIAL/PLATELET
Basophils Absolute: 0 10*3/uL (ref 0–0.1)
Basophils Relative: 0 %
EOS PCT: 3 %
Eosinophils Absolute: 0.4 10*3/uL (ref 0–0.7)
HCT: 35.7 % — ABNORMAL LOW (ref 40.0–52.0)
Hemoglobin: 12.3 g/dL — ABNORMAL LOW (ref 13.0–18.0)
LYMPHS ABS: 1.3 10*3/uL (ref 1.0–3.6)
Lymphocytes Relative: 10 %
MCH: 28.8 pg (ref 26.0–34.0)
MCHC: 34.5 g/dL (ref 32.0–36.0)
MCV: 83.5 fL (ref 80.0–100.0)
MONO ABS: 1.6 10*3/uL — AB (ref 0.2–1.0)
MONOS PCT: 12 %
Neutro Abs: 10.1 10*3/uL — ABNORMAL HIGH (ref 1.4–6.5)
Neutrophils Relative %: 75 %
PLATELETS: 277 10*3/uL (ref 150–440)
RBC: 4.28 MIL/uL — ABNORMAL LOW (ref 4.40–5.90)
RDW: 14.4 % (ref 11.5–14.5)
WBC: 13.5 10*3/uL — AB (ref 3.8–10.6)

## 2016-07-02 LAB — COMPREHENSIVE METABOLIC PANEL
ALT: 26 U/L (ref 17–63)
ANION GAP: 14 (ref 5–15)
AST: 26 U/L (ref 15–41)
Albumin: 4.2 g/dL (ref 3.5–5.0)
Alkaline Phosphatase: 45 U/L (ref 38–126)
BUN: 66 mg/dL — ABNORMAL HIGH (ref 6–20)
CHLORIDE: 97 mmol/L — AB (ref 101–111)
CO2: 23 mmol/L (ref 22–32)
CREATININE: 8 mg/dL — AB (ref 0.61–1.24)
Calcium: 8.4 mg/dL — ABNORMAL LOW (ref 8.9–10.3)
GFR, EST AFRICAN AMERICAN: 8 mL/min — AB (ref >60)
GFR, EST NON AFRICAN AMERICAN: 7 mL/min — AB (ref >60)
Glucose, Bld: 103 mg/dL — ABNORMAL HIGH (ref 65–99)
Potassium: 4.2 mmol/L (ref 3.5–5.1)
SODIUM: 134 mmol/L — AB (ref 135–145)
Total Bilirubin: 1.1 mg/dL (ref 0.3–1.2)
Total Protein: 7.3 g/dL (ref 6.5–8.1)

## 2016-07-02 LAB — URINALYSIS, COMPLETE (UACMP) WITH MICROSCOPIC
BILIRUBIN URINE: NEGATIVE
GLUCOSE, UA: 50 mg/dL — AB
Ketones, ur: NEGATIVE mg/dL
LEUKOCYTES UA: NEGATIVE
Nitrite: NEGATIVE
PH: 5 (ref 5.0–8.0)
Protein, ur: 100 mg/dL — AB
SPECIFIC GRAVITY, URINE: 1.017 (ref 1.005–1.030)

## 2016-07-02 LAB — PROTEIN / CREATININE RATIO, URINE
CREATININE, URINE: 242 mg/dL
Protein Creatinine Ratio: 0.6 mg/mg{Cre} — ABNORMAL HIGH (ref 0.00–0.15)
TOTAL PROTEIN, URINE: 146 mg/dL

## 2016-07-02 LAB — SEDIMENTATION RATE: Sed Rate: 46 mm/hr — ABNORMAL HIGH (ref 0–20)

## 2016-07-02 MED ORDER — HYDROCODONE-ACETAMINOPHEN 5-325 MG PO TABS: 1.0000 | ORAL_TABLET | ORAL | Status: DC | PRN

## 2016-07-02 MED ORDER — HYDROXYZINE PAMOATE 25 MG PO CAPS
25.0000 mg | ORAL_CAPSULE | Freq: Every evening | ORAL | Status: DC | PRN
  Filled 2016-07-02: qty 2

## 2016-07-02 MED ORDER — INFLUENZA VAC SPLIT QUAD 0.5 ML IM SUSY: 0.5000 mL | PREFILLED_SYRINGE | INTRAMUSCULAR | Status: DC

## 2016-07-02 MED ORDER — OXYCODONE-ACETAMINOPHEN 10-325 MG PO TABS: 1.0000 | ORAL_TABLET | Freq: Every day | ORAL | Status: DC

## 2016-07-02 MED ORDER — ACETAMINOPHEN 325 MG PO TABS
650.0000 mg | ORAL_TABLET | Freq: Four times a day (QID) | ORAL | Status: DC | PRN
  Administered 2016-07-05: 650 mg via ORAL
  Filled 2016-07-02: qty 2

## 2016-07-02 MED ORDER — HYDROXYZINE HCL 25 MG PO TABS
25.0000 mg | ORAL_TABLET | Freq: Every evening | ORAL | Status: DC | PRN
  Filled 2016-07-02: qty 2

## 2016-07-02 MED ORDER — ONDANSETRON HCL 4 MG PO TABS: 4.0000 mg | ORAL_TABLET | Freq: Four times a day (QID) | ORAL | Status: DC | PRN

## 2016-07-02 MED ORDER — ONDANSETRON HCL 4 MG/2ML IJ SOLN: 4.0000 mg | Freq: Four times a day (QID) | INTRAMUSCULAR | Status: DC | PRN

## 2016-07-02 MED ORDER — ACETAMINOPHEN 650 MG RE SUPP: 650.0000 mg | Freq: Four times a day (QID) | RECTAL | Status: DC | PRN

## 2016-07-02 MED ORDER — DOCUSATE SODIUM 100 MG PO CAPS
100.0000 mg | ORAL_CAPSULE | Freq: Two times a day (BID) | ORAL | Status: DC
  Administered 2016-07-02 – 2016-07-05 (×6): 100 mg via ORAL
  Filled 2016-07-02 (×6): qty 1

## 2016-07-02 MED ORDER — SODIUM CHLORIDE 0.9 % IV BOLUS (SEPSIS): 500.0000 mL | Freq: Once | INTRAVENOUS | Status: DC

## 2016-07-02 MED ORDER — AMITRIPTYLINE HCL 10 MG PO TABS: 50.0000 mg | ORAL_TABLET | Freq: Every day | ORAL | Status: DC

## 2016-07-02 MED ORDER — SODIUM CHLORIDE 0.9 % IV SOLN
INTRAVENOUS | Status: DC
  Administered 2016-07-02 – 2016-07-05 (×4): via INTRAVENOUS

## 2016-07-02 MED ORDER — BISACODYL 5 MG PO TBEC: 5.0000 mg | DELAYED_RELEASE_TABLET | Freq: Every day | ORAL | Status: DC | PRN

## 2016-07-02 MED ORDER — PNEUMOCOCCAL VAC POLYVALENT 25 MCG/0.5ML IJ INJ
0.5000 mL | INJECTION | INTRAMUSCULAR | Status: AC
  Administered 2016-07-05: 0.5 mL via INTRAMUSCULAR
  Filled 2016-07-02: qty 0.5

## 2016-07-02 MED ORDER — DEXTROSE 5 % IV SOLN: 1.0000 g | INTRAVENOUS | Status: DC

## 2016-07-02 MED ORDER — CEFTRIAXONE SODIUM-DEXTROSE 1-3.74 GM-% IV SOLR
1.0000 g | INTRAVENOUS | Status: DC
  Administered 2016-07-02 – 2016-07-04 (×3): 1 g via INTRAVENOUS
  Filled 2016-07-02 (×4): qty 50

## 2016-07-02 MED ORDER — AMLODIPINE BESYLATE 5 MG PO TABS: 5.0000 mg | ORAL_TABLET | Freq: Every day | ORAL | Status: DC

## 2016-07-02 MED ORDER — HEPARIN SODIUM (PORCINE) 5000 UNIT/ML IJ SOLN
5000.0000 [IU] | Freq: Three times a day (TID) | INTRAMUSCULAR | Status: DC
  Administered 2016-07-02 – 2016-07-04 (×6): 5000 [IU] via SUBCUTANEOUS
  Filled 2016-07-02 (×6): qty 1

## 2016-07-02 MED ORDER — FLUTICASONE PROPIONATE 50 MCG/ACT NA SUSP
2.0000 | Freq: Every day | NASAL | Status: DC
  Administered 2016-07-02 – 2016-07-03 (×2): 2 via NASAL
  Filled 2016-07-02: qty 16

## 2016-07-02 NOTE — ED Notes (Signed)
Transporting patient to room 218-2C 

## 2016-07-02 NOTE — ED Triage Notes (Signed)
Wife states pt has been confused and altered for 2 days, states right lower back and flank pain, states decreased urine output, pt appears altered in triage, fidgety, states he takes 15 mg of oxy every 6 hours for chronic neck pain, wife states multiple falls yesterday and today

## 2016-07-02 NOTE — ED Provider Notes (Signed)
[facility] Emergency Department Provider Note  ____________________________________________   First MD Initiated Contact with Patient 07/02/16 1601     (approximate)  I have reviewed the triage vital signs and the nursing notes.   HISTORY  Chief Complaint Altered Mental Status and Flank Pain   HPI Brett Huber is a 51 y.o. male with recent diagnosis of kidney failure who is presenting to the emergency department today with several weeks of bilateral low back pain which is worse on the right as well as altered mental status. He was seen by his primary care doctor where he was diagnosed with kidney failure. He was then referred to a nephrologist. He was supposed to go for an ultrasound this coming week. However, his pain as well as mental status have worsened. He says that he has also had several episodes of vomiting. He also has not urinated over the past 24 hours.Patient is a committee by his wife who says that he has also had twitching which is a new symptom.  Patient offered but would not like pain medicine at this time.  Past Medical History:  Diagnosis Date  . Anxiety   . Chronic pain   . DDD (degenerative disc disease)   . Hyperlipidemia   . Hypertension   . Insomnia   . Sinus congestion     Patient Active Problem List   Diagnosis Date Noted  . Acute on chronic renal failure (HCC) 07/02/2016  . Acute on chronic kidney failure (HCC) 06/08/2016  . Colon cancer screening 06/04/2016  . Abnormal glucose 06/04/2016  . Hypertension 07/23/2015  . Hyperlipidemia 07/23/2015  . Anxiety and depression 07/23/2015  . Chronic pain 07/23/2015  . Insomnia 07/23/2015  . Erectile dysfunction 07/23/2015  . Vitamin D deficiency 07/23/2015    Past Surgical History:  Procedure Laterality Date  . CERVICAL FUSION  2004  . CHOLECYSTECTOMY  2007  . neck and disc replacement    . SHOULDER ARTHROSCOPY WITH ROTATOR CUFF REPAIR AND SUBACROMIAL  DECOMPRESSION Left 01/29/2013   Procedure: LEFT SHOULDER ARTHROSCOPY WITH ROTATOR CUFF REPAIR AND SUBACROMIAL DECOMPRESSION DISTAL CLAVICLE RESECTION;  Surgeon: Mable Paris, MD;  Location: [facility];  Service: Orthopedics;  Laterality: Left;    Prior to Admission medications   Medication Sig Start Date End Date Taking? Authorizing Provider  amitriptyline (ELAVIL) 50 MG tablet Take 1 tablet (50 mg total) by mouth at bedtime. 05/25/16  Yes Alexander J Karamalegos, DO  amLODipine (NORVASC) 5 MG tablet Take 1 tablet (5 mg total) by mouth daily. 08/06/15  Yes Amy Rusty Aus, NP  gabapentin (NEURONTIN) 300 MG capsule Take 300 mg by mouth 3 (three) times daily.   Yes Historical Provider, MD  hydrOXYzine (VISTARIL) 25 MG capsule Take 1-2 capsules (25-50 mg total) by mouth at bedtime as needed (sleep). 05/25/16  Yes Alexander Fredric Mare, DO  olmesartan-hydrochlorothiazide (BENICAR HCT) 20-12.5 MG tablet Take 1 tablet by mouth daily. Reported on 07/23/2015 07/23/15  Yes Amy Rusty Aus, NP  oxyCODONE (ROXICODONE) 15 MG immediate release tablet Take 15 mg by mouth 4 (four) times daily as needed for pain.   Yes Historical Provider, MD  oxyCODONE-acetaminophen (PERCOCET) 10-325 MG tablet Take 1 tablet by mouth daily.   Yes Historical Provider, MD  sildenafil (REVATIO) 20 MG tablet Take 3-5 pills as needed 30 minute prior to sex. 05/25/16  Yes Alexander Fredric Mare, DO  Cholecalciferol (VITAMIN D3) 5000 units CAPS Take 1 capsule (5,000 Units total) by mouth daily. For  12 weeks, then start Vitamin D3 2,000 units daily (OTC) Patient not taking: Reported on 07/02/2016 06/08/16   Smitty Cords, DO  fluticasone [facility]) 50 MCG/ACT nasal spray Place 2 sprays into both nostrils daily. Use for 4-6 weeks then stop and use seasonally or as needed. Patient not taking: Reported on 07/02/2016 06/04/16   Smitty Cords, DO    Allergies Bee venom  Family History  Problem  Relation Age of Onset  . Diabetes Mother   . Stroke Father     Social History Social History  Substance Use Topics  . Smoking status: Never Smoker  . Smokeless tobacco: Current User    Types: Chew  . Alcohol use Yes     Comment: occ    Review of Systems Constitutional: No fever/chills Eyes: No visual changes. ENT: No sore throat. Cardiovascular: Denies chest pain. Respiratory: Denies shortness of breath. Gastrointestinal: No abdominal pain.  No nausea, no vomiting.  No diarrhea.  No constipation. Genitourinary: As above Musculoskeletal: As above Skin: Negative for rash. Neurological: Negative for headaches, focal weakness or numbness.  10-point ROS otherwise negative.  ____________________________________________   PHYSICAL EXAM:  VITAL SIGNS: ED Triage Vitals  Enc Vitals Group     BP 07/02/16 1553 (!) 159/110     Pulse Rate 07/02/16 1553 (!) 110     Resp 07/02/16 1553 18     Temp 07/02/16 1553 97.9 F (36.6 C)     Temp Source 07/02/16 1553 Oral     SpO2 07/02/16 1553 (!) 86 %     Weight 07/02/16 1556 180 lb (81.6 kg)     Height 07/02/16 1556 5\' 9"  (1.753 m)     Head Circumference --      Peak Flow --      Pain Score 07/02/16 1625 6     Pain Loc --      Pain Edu? --      Excl. in GC? --     Constitutional: Alert and oriented. Well appearing and in no acute distress. Eyes: Conjunctivae are normal. PERRL. EOMI. Head: Atraumatic. Nose: No congestion/rhinnorhea. Mouth/Throat: Mucous membranes are moist.  Neck: No stridor.   Cardiovascular: Normal rate, regular rhythm. Grossly normal heart sounds.   Respiratory: Normal respiratory effort.  No retractions. Lungs CTAB. Gastrointestinal: Soft and nontender. No distention. No CVA tenderness. Musculoskeletal: No lower extremity tenderness nor edema.  No joint effusions. Neurologic:  Normal speech and language. No gross focal neurologic deficits are appreciated.  Skin:  Skin is warm, dry and intact. No rash  noted. Psychiatric: Mood and affect are normal. Speech and behavior are normal.  ____________________________________________   LABS (all labs ordered are listed, but only abnormal results are displayed)  Labs Reviewed  URINALYSIS, COMPLETE (UACMP) WITH MICROSCOPIC - Abnormal; Notable for the following:       Result Value   Color, Urine YELLOW (*)    APPearance HAZY (*)    Glucose, UA 50 (*)    Hgb urine dipstick MODERATE (*)    Protein, ur 100 (*)    Bacteria, UA RARE (*)    Squamous Epithelial / LPF 0-5 (*)    All other components within normal limits  CBC WITH DIFFERENTIAL/PLATELET - Abnormal; Notable for the following:    WBC 13.5 (*)    RBC 4.28 (*)    Hemoglobin 12.3 (*)    HCT 35.7 (*)    Neutro Abs 10.1 (*)    Monocytes Absolute 1.6 (*)    All other  components within normal limits  COMPREHENSIVE METABOLIC PANEL - Abnormal; Notable for the following:    Sodium 134 (*)    Chloride 97 (*)    Glucose, Bld 103 (*)    BUN 66 (*)    Creatinine, Ser 8.00 (*)    Calcium 8.4 (*)    GFR calc non Af Amer 7 (*)    GFR calc Af Amer 8 (*)    All other components within normal limits  CBC  CREATININE, SERUM  BASIC METABOLIC PANEL  CBC  PROTEIN / CREATININE RATIO, URINE   ____________________________________________  EKG   ____________________________________________  RADIOLOGY   ____________________________________________   PROCEDURES  Procedure(s) performed:   Procedures  Critical Care performed:   ____________________________________________   INITIAL IMPRESSION / ASSESSMENT AND PLAN / ED COURSE  Pertinent labs & imaging results that were available during my care of the patient were reviewed by me and considered in my medical decision making (see chart for details).   Clinical Course   ----------------------------------------- 6:45 PM on 07/02/2016 -----------------------------------------  Patient with acute on chronic renal failure of unknown  etiology. Plan to admit to the hospital. Signed out to Dr. Pablo LawrenceKonadena.  Also discussed with Dr. Thedore MinsSingh of nephrology who will be following the patient has a consult. Patient as well as family are aware of need for admission.   ____________________________________________   FINAL CLINICAL IMPRESSION(S) / ED DIAGNOSES  Urermic encephalopathy. Acute on chronic renal failure.    NEW MEDICATIONS STARTED DURING THIS VISIT:  New Prescriptions   No medications on file     Note:  This document was prepared using Dragon voice recognition software and may include unintentional dictation errors.    Myrna Blazeravid Matthew Schaevitz, MD 07/02/16 [id number]

## 2016-07-02 NOTE — Progress Notes (Signed)
OUTPATIENT LABS  Patient Name: Brett Huber Date of Birth: 15-Jul-1965 Chart Number: [id number] Gender: Male Ordering Provider: Driscilla Moats Draw Date: 06/14/2016 03:21 PM Accession: [id number] Report Date: 06/17/2016 05:36 PM Laboratory: [facility] Spec Recvd:  Phosphorus, Serum Test Name Result Abnormal Flag Dimension Normal Range Result Type Location Phosphorus, Serum 3.8  mg/dL [2.5-4.5] F    Protein Electro.,S Test Name Result Abnormal Flag Dimension Normal Range Result Type Location Albumin 3.8  g/dL [2.9-4.4] F  Alpha-1-Globulin 0.3  g/dL [0.0-0.4] F  Alpha-2-Globulin 0.9  g/dL [0.4-1.0] F  Beta Globulin 1.6 H g/dL [0.7-1.3] F  Gamma Globulin 0.9  g/dL [0.4-1.8] F  M-Spike Not Observed  g/dL [Not Observed] F  Globulin, Total 3.7  g/dL [2.2-3.9] F  A/G Ratio 1.0   [0.7-1.7] F  Please note: SPRCS    F  Protein electrophoresis scan will follow via computer, mail, or courier delivery. PDF .    F    Complement C4, Serum Test Name Result Abnormal Flag Dimension Normal Range Result Type Location Complement C4, Serum 35  mg/dL [14-44] F    Prot+CreatU (Random) Test Name Result Abnormal Flag Dimension Normal Range Result Type Location Creatinine, Urine 111.0  mg/dL [Not Estab.] F  Protein/Creat Ratio 125  mg/g creat [0-200] F    CBC With Differential/Platelet Test Name Result Abnormal Flag Dimension Normal Range Result Type Location WBC 18.5 H x10E3/uL [3.4-10.8] F  RBC 5.22  x10E6/uL [4.14-5.80] F  Hemoglobin 15.2  g/dL [13.0-17.7] F  Hematocrit 42.7  % [37.5-51.0] F  MCV 82  fL [79-97] F  MCH 29.1  pg [26.6-33.0] F  MCHC 35.6  g/dL [31.5-35.7] F  RDW 14.1  % [12.3-15.4] F  Platelets 334  x10E3/uL [150-379] F  Neutrophils 80  % [Not Estab.] F  Lymphs 10  % [Not Estab.] F  Monocytes 8  % [Not Estab.] F  Eos 1  % [Not Estab.] F  Basos 0  % [Not Estab.] F  Immature Cells     X  Neutrophils (Absolute) 14.9 H x10E3/uL [1.4-7.0] F  Lymphs  (Absolute) 1.8  x10E3/uL [0.7-3.1] F  Monocytes(Absolute) 1.5 H x10E3/uL [0.1-0.9] F  Eos (Absolute) 0.2  x10E3/uL [0.0-0.4] F  Baso (Absolute) 0.0  x10E3/uL [0.0-0.2] F  Immature Granulocytes 1  % [Not Estab.] F  Immature Grans (Abs) 0.1  x10E3/uL [0.0-0.1] F  NRBC   %  X    Complement C3, Serum Test Name Result Abnormal Flag Dimension Normal Range Result Type Location Complement C3, Serum 189 H mg/dL [82-167] F    HBsAg Screen Test Name Result Abnormal Flag Dimension Normal Range Result Type Location HBsAg Screen Negative   [Negative] F    Hep B Core Ab, Tot Test Name Result Abnormal Flag Dimension Normal Range Result Type Location Hep B Core Ab, Tot Negative   [Negative] F    PTH, Intact Test Name Result Abnormal Flag Dimension Normal Range Result Type Location PTH, Intact 21  pg/mL [15-65] F    Vitamin D, 25-Hydroxy Test Name Result Abnormal Flag Dimension Normal Range Result Type Location Vitamin D, 25-Hydroxy 15.5 L ng/mL [30.0-100.0] F  Vitamin D deficiency has been defined by the Institute of Medicine and an Endocrine Society practice guideline as a level of serum 25-OH vitamin D less than 20 ng/mL (1,2). The Endocrine Society went on to further define vitamin D insufficiency as a level between 21 and 29 ng/mL (2). 1. IOM (Institute of Medicine). 2010. Dietary reference    intakes for calcium  and D. [place]: The    [employer]. 2. Holick MF, Binkley Calais, Bischoff-Ferrari HA, et al.    Evaluation, treatment, and prevention of vitamin D    deficiency: an Endocrine Society clinical practice    guideline. JCEM. 2011 Jul; 96(7):1911-30.   Antiglomerular BM Ab Test Name Result Abnormal Flag Dimension Normal Range Result Type Location Antiglomerular BM Ab 5  units [0-20] F                                    Negative                   0 - 20                                   Weak Positive             21 - 30                                   Moderate to  Strong Positive   >30   Eosinophil, Urine Test Name Result Abnormal Flag Dimension Normal Range Result Type Location Eosinophil, Urine No Eosinophils Seen  %  F                                                   <5% few or none seen   HCV Antibody Test Name Result Abnormal Flag Dimension Normal Range Result Type Location Hep C Virus Ab <0.1  s/co ratio [0.0-0.9] F                                                   Negative:     < 0.8                                             Indeterminate: 0.8 - 0.9                                                  Positive:     > 0.9                                                                      .                 The CDC recommends that a positive HCV antibody result                 be followed up  with a HCV Nucleic Acid Amplification                 test (176160).   Antimyeloperoxidase (MPO) Abs Test Name Result Abnormal Flag Dimension Normal Range Result Type Location Antimyeloperoxidase (MPO) Abs <9.0  U/mL [0.0-9.0] F    Antiproteinase 3 (PR-3) Abs Test Name Result Abnormal Flag Dimension Normal Range Result Type Location Antiproteinase 3 (PR-3) Abs <3.5  U/mL [0.0-3.5] F    ANA w/Reflex if Positive Test Name Result Abnormal Flag Dimension Normal Range Result Type Location ANA Direct Negative   [Negative] F    Comp. Metabolic Panel (14) Test Name Result Abnormal Flag Dimension Normal Range Result Type Location Glucose, Serum 101 H mg/dL [65-99] F  BUN 20  mg/dL [6-24] F  Creatinine, Serum 1.35 H mg/dL [0.76-1.27] F  eGFR If NonAfricn Am 60  mL/min/1.73 [ >59] F  eGFR If Africn Am 69  mL/min/1.73 [ >59] F  BUN/Creatinine Ratio 15   [9-20] F  Sodium, Serum 139  mmol/L [134-144] F  Potassium, Serum 4.7  mmol/L [3.5-5.2] F  Chloride, Serum 97  mmol/L [96-106] F  Carbon Dioxide, Total 27  mmol/L [18-29] F  Calcium, Serum 10.1  mg/dL [8.7-10.2] F  Protein, Total, Serum 7.5  g/dL [6.0-8.5] F  Albumin,  Serum 4.5  g/dL [3.5-5.5] F  Globulin, Total 3.0  g/dL [1.5-4.5] F  A/G Ratio 1.5   [1.2-2.2] F  Bilirubin, Total 0.3  mg/dL [0.0-1.2] F  Alkaline Phosphatase, S 67  IU/L [39-117] F  AST (SGOT) 13  IU/L [0-40] F  ALT (SGPT) 15  IU/L [0-44] F    Protein Electro, Random Urine Test Name Result Abnormal Flag Dimension Normal Range Result Type Location Protein,Total,Urine 13.9  mg/dL [Not Estab.] F  Albumin, U 100.0  %  F  Alpha-1-Globulin, U 0.0  %  F  Alpha-2-Globulin, U 0.0  %  F  Beta Globulin, U 0.0  %  F  Gamma Globulin, U 0.0  %  F  M-Spike, % Not Observed  % [Not Observed] F  Please note: SPRCS    F  Protein electrophoresis scan will follow via computer, mail, or courier delivery.

## 2016-07-02 NOTE — ED Notes (Signed)
Bladder scan indicated 514 ml, MD made aware.

## 2016-07-02 NOTE — ED Notes (Signed)
Patient transported to Ultrasound 

## 2016-07-02 NOTE — H&P (Signed)
[facility] [place] at [facility]   PATIENT NAME: Brett LenzBradley Huber    MR#:  [id number]  DATE OF BIRTH:  11/27/1965  DATE OF ADMISSION:  07/02/2016  PRIMARY CARE PHYSICIAN: Saralyn [name] Karamalegos, DO   REQUESTING/REFERRING PHYSICIAN: Dr. Langston MaskerShaevitz  CHIEF COMPLAINT: Confusion    Chief Complaint  Patient presents with  . Altered Mental Status  . Flank Pain    HISTORY OF PRESENT ILLNESS:  Brett Huber  is a 51 y.o. male with a known history of Hypertension, chronic neck pain on narcotics, insomnia comes in because of confusion for last 2 days associated with nausea, vomiting, decreased urine output. Patient to wife says that he didn't void at all since morning. Also complains of back pain, flank pain for last 2 days. Patient also noted to have some jerks of hands. Multiple falls yesterday, today. Patient takes oxycodone 15 mg every 6 hours for chronic neck pain because of neck surgery. Follows up with pain management specialist syndrome. Also patient takes Neurontin, amitriptyline for insomnia. Patient, wife denies taking any extra pills. Noted to have creatinine of 8, patient creatinine a month ago is 3. Patient wanted around 300 mL of urine in the emergency room, bladder scan showed 500 mL of urine. Patient has no hyperkalemia. White count 13, hemoglobin 12.3.  PAST MEDICAL HISTORY:   Past Medical History:  Diagnosis Date  . Anxiety   . Chronic pain   . DDD (degenerative disc disease)   . Hyperlipidemia   . Hypertension   . Insomnia   . Sinus congestion     PAST SURGICAL HISTOIRY:   Past Surgical History:  Procedure Laterality Date  . CERVICAL FUSION  2004  . CHOLECYSTECTOMY  2007  . neck and disc replacement    . SHOULDER ARTHROSCOPY WITH ROTATOR CUFF REPAIR AND SUBACROMIAL DECOMPRESSION Left 01/29/2013   Procedure: LEFT SHOULDER ARTHROSCOPY WITH ROTATOR CUFF REPAIR AND SUBACROMIAL DECOMPRESSION DISTAL CLAVICLE RESECTION;  Surgeon: Mable ParisJustin William  Chandler, MD;  Location: [facility];  Service: Orthopedics;  Laterality: Left;    SOCIAL HISTORY:   Social History  Substance Use Topics  . Smoking status: Never Smoker  . Smokeless tobacco: Current User    Types: Chew  . Alcohol use Yes     Comment: occ    FAMILY HISTORY:   Family History  Problem Relation Age of Onset  . Diabetes Mother   . Stroke Father     DRUG ALLERGIES:   Allergies  Allergen Reactions  . Bee Venom Shortness Of Breath    REVIEW OF SYSTEMS:  CONSTITUTIONAL: No fever,Has fatigue, generalized weakness, multiple falls  EYES: No blurred or double vision.  EARS, NOSE, AND THROAT: No tinnitus or ear pain.  RESPIRATORY: No cough, shortness of breath, wheezing or hemoptysis.  CARDIOVASCULAR: No chest pain, orthopnea, edema.  GASTROINTESTINAL: nausea,vomtiing,but no diarrhea.abdominal pain and bilateral flank pain,  GENITOURINARY: No dysuria, hematuria. His oliguria. ENDOCRINE: No polyuria, nocturia,  HEMATOLOGY: No anemia, easy bruising or bleeding SKIN: No rash or lesion. MUSCULOSKELETAL: No joint pain or arthritis.   NEUROLOGIC: No tingling, numbness, weakness. Confusion on and off, jerking of her hands since yesterday. PSYCHIATRY: No anxiety or depression.   MEDICATIONS AT HOME:   Prior to Admission medications   Medication Sig Start Date End Date Taking? Authorizing Provider  amitriptyline (ELAVIL) 50 MG tablet Take 1 tablet (50 mg total) by mouth at bedtime. 05/25/16  Yes Alexander J Karamalegos, DO  amLODipine (NORVASC) 5 MG tablet Take 1  tablet (5 mg total) by mouth daily. 08/06/15  Yes Amy Rusty Aus, NP  gabapentin (NEURONTIN) 300 MG capsule Take 300 mg by mouth 3 (three) times daily.   Yes Historical Provider, MD  hydrOXYzine (VISTARIL) 25 MG capsule Take 1-2 capsules (25-50 mg total) by mouth at bedtime as needed (sleep). 05/25/16  Yes Alexander Fredric Mare, DO  olmesartan-hydrochlorothiazide (BENICAR HCT) 20-12.5 MG tablet  Take 1 tablet by mouth daily. Reported on 07/23/2015 07/23/15  Yes Amy Rusty Aus, NP  oxyCODONE (ROXICODONE) 15 MG immediate release tablet Take 15 mg by mouth 4 (four) times daily as needed for pain.   Yes Historical Provider, MD  oxyCODONE-acetaminophen (PERCOCET) 10-325 MG tablet Take 1 tablet by mouth daily.   Yes Historical Provider, MD  sildenafil (REVATIO) 20 MG tablet Take 3-5 pills as needed 30 minute prior to sex. 05/25/16  Yes Alexander Fredric Mare, DO  Cholecalciferol (VITAMIN D3) 5000 units CAPS Take 1 capsule (5,000 Units total) by mouth daily. For 12 weeks, then start Vitamin D3 2,000 units daily (OTC) Patient not taking: Reported on 07/02/2016 06/08/16   Smitty Cords, DO  fluticasone [facility]) 50 MCG/ACT nasal spray Place 2 sprays into both nostrils daily. Use for 4-6 weeks then stop and use seasonally or as needed. Patient not taking: Reported on 07/02/2016 06/04/16   Smitty Cords, DO      VITAL SIGNS:  Blood pressure 106/65, pulse (!) 102, temperature 97.9 F (36.6 C), temperature source Oral, resp. rate 16, height 5\' 9"  (1.753 m), weight 81.6 kg (180 lb), SpO2 94 %.  PHYSICAL EXAMINATION:  GENERAL:  51 y.o.-year-old patient lying in the bed with no acute distress. Appears clinically dry. EYES: Pupils equal, round, reactive to light and accommodation. No scleral icterus. Extraocular muscles intact.  HEENT: Head atraumatic, normocephalic. Oropharynx and nasopharynx clear.  NECK:  Supple, no jugular venous distention. No thyroid enlargement, no tenderness.  LUNGS: Normal breath sounds bilaterally, no wheezing, rales,rhonchi or crepitation. No use of accessory muscles of respiration.  CARDIOVASCULAR: S1, S2 normal. No murmurs, rubs, or gallops.  ABDOMEN: Soft, nontender, nondistended. Bowel sounds present. No organomegaly or mass.  EXTREMITIES: No pedal edema, cyanosis, or clubbing.  NEUROLOGIC: Cranial nerves II through XII are intact. Muscle strength 5/5  in all extremities. Sensation intact. Gait not checked.  PSYCHIATRIC: The patient is alert and oriented x 3.  SKIN: No obvious rash, lesion, or ulcer.   LABORATORY PANEL:   CBC  Recent Labs Lab 07/02/16 1629  WBC 13.5*  HGB 12.3*  HCT 35.7*  PLT 277   ------------------------------------------------------------------------------------------------------------------  Chemistries   Recent Labs Lab 07/02/16 1629  NA 134*  K 4.2  CL 97*  CO2 23  GLUCOSE 103*  BUN 66*  CREATININE 8.00*  CALCIUM 8.4*  AST 26  ALT 26  ALKPHOS 45  BILITOT 1.1   ------------------------------------------------------------------------------------------------------------------  Cardiac Enzymes No results for input(s): TROPONINI in the last 168 hours. ------------------------------------------------------------------------------------------------------------------  RADIOLOGY:  Dg Chest 1 View  Result Date: 07/02/2016 CLINICAL DATA:  Hypoxia, confusion EXAM: CHEST 1 VIEW COMPARISON:  08/12/2015 FINDINGS: Linear atelectasis at both lung bases. No consolidation or effusion. Normal heart size. No pneumothorax. IMPRESSION: Linear bibasilar atelectasis Electronically Signed   By: Jasmine Pang M.D.   On: 07/02/2016 18:36   US Renal  Result Date: 07/02/2016 CLINICAL DATA:  Slight and back pain EXAM: RENAL / URINARY TRACT ULTRASOUND COMPLETE COMPARISON:  None. FINDINGS: Right Kidney: Length: 10.8 cm. Echogenicity and renal cortical thickness are within normal  limits. No mass, perinephric fluid, or hydronephrosis visualized. No sonographically demonstrable calculus or ureterectasis. Left Kidney: Length: 10.3 cm. Echogenicity and renal cortical thickness are within normal limits. No mass, perinephric fluid, or hydronephrosis visualized. No sonographically demonstrable calculus or ureterectasis. Bladder: Appears normal for degree of bladder distention. IMPRESSION: Study within normal limits. Electronically  Signed   By: Bretta Bang III M.D.   On: 07/02/2016 17:18    EKG:   Orders placed or performed during the hospital encounter of 01/29/13  . EKG 12 lead  . EKG 12 lead    IMPRESSION AND PLAN:  1 acute on chronic renal failure likely due to ATN with poor by mouth intake, nausea, does have hypotension here blood pressure is borderline During my visit at 95/70. Admitted to hospitalist service, continue IV hydration, monitor urine output, patient does have urine retention  bladder scan showing 500 mL PVRof urine. Continue Foley catheter for monitoring. Output, continue monitoring kidney function, nephrology is consulted, seen and discussed with patient's wife.  #2 chronic neck pain avoid high-dose narcotics,  #3 insomnia patient amitriptyline and Neurontin were discontinued this time because of worsening renal function, jerks. Patient may be having myoclonic jerks  on and off. 4. Hypoxia, elevated white count: Likely secondary to bibasilar atelectasis/pneumonia: We'll add empiric antibiotics. #5 multiple falls secondary to renal failure, dehydration, confusion secondary to multiple medications including Neurontin and amitriptyline, narcotics.  Full code D/w wife in detail.  All the records are reviewed and case discussed with ED provider. Management plans discussed with the patient, family and they are in agreement.  CODE STATUS: full  TOTAL TIME TAKING CARE OF THIS PATIENT:91minutes.    Katha Hamming M.D on 07/02/2016 at 6:43 PM  Between 7am to 6pm - Pager - [phone]  After 6pm go to [email or web address] - password [email or web address]  [facility]  Office  [phone]  CC: Primary care physician; Saralyn Pilar, DO  Note: This dictation was prepared with Dragon dictation along with smaller phrase technology. Any transcriptional errors that result from this process are unintentional.

## 2016-07-03 ENCOUNTER — Inpatient Hospital Stay: Payer: Managed Care, Other (non HMO)

## 2016-07-03 LAB — ANA COMPREHENSIVE PANEL
Chromatin Ab SerPl-aCnc: <0.2 AI (ref 0.0–0.9)
ENA SM Ab Ser-aCnc: <0.2 AI (ref 0.0–0.9)
RIBONUCLEIC PROTEIN: 0.4 AI (ref 0.0–0.9)
SSB (La) (ENA) Antibody, IgG: <0.2 AI (ref 0.0–0.9)
Scleroderma (Scl-70) (ENA) Antibody, IgG: <0.2 AI (ref 0.0–0.9)

## 2016-07-03 LAB — BASIC METABOLIC PANEL
Anion gap: 12 (ref 5–15)
BUN: 57 mg/dL — AB (ref 6–20)
CHLORIDE: 100 mmol/L — AB (ref 101–111)
CO2: 24 mmol/L (ref 22–32)
CREATININE: 4.45 mg/dL — AB (ref 0.61–1.24)
Calcium: 8.7 mg/dL — ABNORMAL LOW (ref 8.9–10.3)
GFR calc Af Amer: 16 mL/min — ABNORMAL LOW (ref >60)
GFR calc non Af Amer: 14 mL/min — ABNORMAL LOW (ref >60)
Glucose, Bld: 79 mg/dL (ref 65–99)
Potassium: 4.1 mmol/L (ref 3.5–5.1)
SODIUM: 136 mmol/L (ref 135–145)

## 2016-07-03 LAB — CBC
HEMATOCRIT: 35.8 % — AB (ref 40.0–52.0)
HEMOGLOBIN: 12.3 g/dL — AB (ref 13.0–18.0)
MCH: 28.6 pg (ref 26.0–34.0)
MCHC: 34.4 g/dL (ref 32.0–36.0)
MCV: 83.2 fL (ref 80.0–100.0)
Platelets: 254 10*3/uL (ref 150–440)
RBC: 4.31 MIL/uL — ABNORMAL LOW (ref 4.40–5.90)
RDW: 14.6 % — AB (ref 11.5–14.5)
WBC: 12.2 10*3/uL — AB (ref 3.8–10.6)

## 2016-07-03 LAB — TROPONIN I: Troponin I: 0.07 ng/mL (ref <0.03)

## 2016-07-03 MED ORDER — ASPIRIN 81 MG PO CHEW
81.0000 mg | CHEWABLE_TABLET | Freq: Every day | ORAL | Status: DC
  Administered 2016-07-03 – 2016-07-05 (×3): 81 mg via ORAL
  Filled 2016-07-03 (×3): qty 1

## 2016-07-03 MED ORDER — NITROGLYCERIN 0.4 MG SL SUBL
0.4000 mg | SUBLINGUAL_TABLET | SUBLINGUAL | Status: DC | PRN
  Filled 2016-07-03: qty 1

## 2016-07-03 MED ORDER — DIPHENHYDRAMINE HCL 25 MG PO CAPS
25.0000 mg | ORAL_CAPSULE | ORAL | Status: AC
  Administered 2016-07-03: 25 mg via ORAL
  Filled 2016-07-03: qty 1

## 2016-07-03 MED ORDER — LORAZEPAM 2 MG/ML IJ SOLN
0.5000 mg | Freq: Once | INTRAMUSCULAR | Status: AC
  Administered 2016-07-04: 0.5 mg via INTRAVENOUS
  Filled 2016-07-03: qty 1

## 2016-07-03 NOTE — Progress Notes (Signed)
Dr. Tobi BastosPyreddy notified of patient's complaint of "...feeling like I've had an electrical shock, my tongue is tingling and my throat is burning and my chest is heavy..I can't breathe...: BP 131/68; pulse 123; Acknowledged, new orders written. Windy Carinaurner,Prisca Gearing K, RN 6:12 AM 07/03/2016

## 2016-07-03 NOTE — Progress Notes (Signed)
Subjective:  Patient known to our practice from the patient.  He was recently evaluated by Dr. Cherylann Huber for acute Huber failure.  Serologies were all negative.  Urine protein/creatinine ratio was normal.  Huber failure was thought to be secondary to excessive nonsteroidal use.   He presents to the emergency room for altered mental status and was found to be in Huber failure His serum creatinine was 8.0 at the time of presentation.  He was found to have urinary retention.  Foley catheter was placed.  Serum creatinine has improved to 4.45 today Urine output of 2530 cc as noted in the last 24 hours Patient is very confused and cyst that he is hearing voices He's not able to provide any meaningful history  Objective:  Vital signs in last 24 hours:  Temp:  [97.9 F (36.6 C)-98.2 F (36.8 C)] 98 F (36.7 C) (01/06 0814) Pulse Rate:  [101-123] 113 (01/06 0814) Resp:  [12-23] 20 (01/06 0814) BP: (98-159)/(55-110) 116/67 (01/06 0814) SpO2:  [86 %-98 %] 97 % (01/06 0820) Weight:  [81.6 kg (180 lb)-84.5 kg (186 lb 4.8 oz)] 82.6 kg (182 lb 1.6 oz) (01/06 0500)  Weight change:  Filed Weights   07/02/16 1556 07/02/16 1945 07/03/16 0500  Weight: 81.6 kg (180 lb) 84.5 kg (186 lb 4.8 oz) 82.6 kg (182 lb 1.6 oz)    Intake/Output:    Intake/Output Summary (Last 24 hours) at 07/03/16 1355 Last data filed at 07/03/16 0820  Gross per 24 hour  Intake           831.67 ml  Output             2530 ml  Net         -1698.33 ml     Physical Exam: General: No acute distress, sitting in the bed  HEENT Anicteric, moist oral mucous membranes  Neck supple  Pulm/lungs Normal breathing effort, clear to auscultation  CVS/Heart No rub  Abdomen:  Soft , nontender  Extremities: No edema  Neurologic: Delirious, confused, states that he is hearing voices  Skin: No acute rashes  Foley present       Basic Metabolic Panel:   Recent Labs Lab 07/02/16 1629 07/03/16 0508  NA 134* 136  K 4.2 4.1  CL 97*  100*  CO2 23 24  GLUCOSE 103* 79  BUN 66* 57*  CREATININE 8.00* 4.45*  CALCIUM 8.4* 8.7*     CBC:  Recent Labs Lab 07/02/16 1629 07/03/16 0508  WBC 13.5* 12.2*  NEUTROABS 10.1*  --   HGB 12.3* 12.3*  HCT 35.7* 35.8*  MCV 83.5 83.2  PLT 277 254      Microbiology:  No results found for this or any previous visit (from the past 720 hour(s)).  Coagulation Studies: No results for input(s): LABPROT, INR in the last 72 hours.  Urinalysis:  Recent Labs  07/02/16 1645  COLORURINE YELLOW*  LABSPEC 1.017  PHURINE 5.0  GLUCOSEU 50*  HGBUR MODERATE*  BILIRUBINUR NEGATIVE  KETONESUR NEGATIVE  PROTEINUR 100*  NITRITE NEGATIVE  LEUKOCYTESUR NEGATIVE      Imaging: Dg Chest 1 View  Result Date: 07/02/2016 CLINICAL DATA:  Hypoxia, confusion EXAM: CHEST 1 VIEW COMPARISON:  08/12/2015 FINDINGS: Linear atelectasis at both lung bases. No consolidation or effusion. Normal heart size. No pneumothorax. IMPRESSION: Linear bibasilar atelectasis Electronically Signed   By: Brett Huber  Fujinaga M.D.   On: 07/02/2016 18:36   Brett Huber  Result Date: 07/02/2016 CLINICAL DATA:  Slight and back pain  EXAM: Huber / URINARY TRACT ULTRASOUND COMPLETE COMPARISON:  None. FINDINGS: Right Kidney: Length: 10.8 cm. Echogenicity and Huber cortical thickness are within normal limits. No mass, perinephric fluid, or hydronephrosis visualized. No sonographically demonstrable calculus or ureterectasis. Left Kidney: Length: 10.3 cm. Echogenicity and Huber cortical thickness are within normal limits. No mass, perinephric fluid, or hydronephrosis visualized. No sonographically demonstrable calculus or ureterectasis. Bladder: Appears normal for degree of bladder distention. IMPRESSION: Study within normal limits. Electronically Signed   By: Brett Huber M.D.   On: 07/02/2016 17:18     Medications:   . sodium chloride 50 mL/hr at 07/02/16 2010   . aspirin  81 mg Oral Daily  . cefTRIAXone  1 g Intravenous  Q24H  . docusate sodium  100 mg Oral BID  . fluticasone  2 spray Each Nare Daily  . heparin  5,000 Units Subcutaneous Q8H  . Influenza vac split quadrivalent PF  0.5 mL Intramuscular Tomorrow-1000  . pneumococcal 23 valent vaccine  0.5 mL Intramuscular Tomorrow-1000   acetaminophen **OR** acetaminophen, bisacodyl, HYDROcodone-acetaminophen, hydrOXYzine, nitroGLYCERIN, ondansetron **OR** ondansetron (ZOFRAN) IV  Assessment/ Plan:  50 y.o. male  with hypertension, chronic back pain, insomnia, presented for confusion, nausea, vomiting, decreased output and is found to have cute Huber failure  1.  Acute Huber failure with urinary retnetion Likely secondary to urinary retention which may be due to multiple medications he is taking at home including amitriptyline, gabapentin, hydroxyzine, narcotics Foley catheter has been placed Serum creatinine has improved to 4.45 I do not believe that his mental status changes and hearing of voices is  related to uremia as his serum creatinine has improved significantly after foley placement and urine output is decent. Recommend psychiatry evaluation    LOS: 1 Brett Huber 1/6/20181:55 PM

## 2016-07-03 NOTE — Progress Notes (Signed)
Dr. Tobi BastosPyreddy recalled for dose of Benadry. Acknowledged; new order. Windy Carinaurner,Lucky Alverson K, RN 6:17 AM 07/03/2016

## 2016-07-03 NOTE — Progress Notes (Signed)
[facility] [place] [facility]   PATIENT NAME: Brett Huber    MR#:  [id number]  DATE OF BIRTH:  12/15/1965  SUBJECTIVE:  CHIEF COMPLAINT:   Chief Complaint  Patient presents with  . Altered Mental Status  . Flank Pain     Was getting increasingly confused and having vomiting, also had shaking and tremors for last 2-3 days so brought to emergency room. Found to have severe worsening in the renal function. Started on IV fluid, also placed a Foley catheter for good urine output, had significant improvement in renal function in one night, but have confusion and auditory hallucinations still.  REVIEW OF SYSTEMS:   Patient is confused and hallucinating so not able to give me review of system. Information obtained from his wife who was present in the room.  ROS  DRUG ALLERGIES:   Allergies  Allergen Reactions  . Bee Venom Shortness Of Breath    VITALS:  Blood pressure 120/76, pulse (!) 105, temperature 98.3 F (36.8 C), temperature source Oral, resp. rate 18, height 5\' 9"  (1.753 m), weight 82.6 kg (182 lb 1.6 oz), SpO2 95 %.  PHYSICAL EXAMINATION:  GENERAL:  51 y.o.-year-old patient lying in the bed with no acute distress.  EYES: Pupils equal, round, reactive to light and accommodation. No scleral icterus. Extraocular muscles intact.  HEENT: Head atraumatic, normocephalic. Oropharynx and nasopharynx clear.  NECK:  Supple, no jugular venous distention. No thyroid enlargement, no tenderness. No rigidity. LUNGS: Normal breath sounds bilaterally, no wheezing, rales,rhonchi or crepitation. No use of accessory muscles of respiration.  CARDIOVASCULAR: S1, S2 normal. No murmurs, rubs, or gallops.  ABDOMEN: Soft, nontender, nondistended. Bowel sounds present. No organomegaly or mass.  EXTREMITIES: No pedal edema, cyanosis, or clubbing.  NEUROLOGIC: Cranial nerves II through XII are intact. Muscle strength 5/5 in all extremities. Sensation intact. Gait not  checked. No tremors. PSYCHIATRIC: The patient is alert and oriented x 1.  SKIN: No obvious rash, lesion, or ulcer.   Physical Exam LABORATORY PANEL:   CBC  Recent Labs Lab 07/03/16 0508  WBC 12.2*  HGB 12.3*  HCT 35.8*  PLT 254   ------------------------------------------------------------------------------------------------------------------  Chemistries   Recent Labs Lab 07/02/16 1629 07/03/16 0508  NA 134* 136  K 4.2 4.1  CL 97* 100*  CO2 23 24  GLUCOSE 103* 79  BUN 66* 57*  CREATININE 8.00* 4.45*  CALCIUM 8.4* 8.7*  AST 26  --   ALT 26  --   ALKPHOS 45  --   BILITOT 1.1  --    ------------------------------------------------------------------------------------------------------------------  Cardiac Enzymes  Recent Labs Lab 07/03/16 0508  TROPONINI 0.07*   ------------------------------------------------------------------------------------------------------------------  RADIOLOGY:  Dg Chest 1 View  Result Date: 07/02/2016 CLINICAL DATA:  Hypoxia, confusion EXAM: CHEST 1 VIEW COMPARISON:  08/12/2015 FINDINGS: Linear atelectasis at both lung bases. No consolidation or effusion. Normal heart size. No pneumothorax. IMPRESSION: Linear bibasilar atelectasis Electronically Signed   By: Jasmine PangKim  Fujinaga M.D.   On: 07/02/2016 18:36   Ct Head Wo Contrast  Result Date: 07/03/2016 CLINICAL DATA:  Pt very confused while in CT. Per note in pt chart "Patient known to our practice from the patient. He was recently evaluated by Dr. Cherylann RatelLateef for acute renal failure. Serologies were all negative. Urine protein/creatinine ratio.*comment was truncated* EXAM: CT HEAD WITHOUT CONTRAST TECHNIQUE: Contiguous axial images were obtained from the base of the skull through the vertex without intravenous contrast. COMPARISON:  Head CT 11/04/2004 FINDINGS: Brain: No acute intracranial hemorrhage. No  focal mass lesion. No CT evidence of acute infarction. No midline shift or mass effect. No  hydrocephalus. Basilar cisterns are patent. Vascular: No hyperdense vessel or unexpected calcification. Skull: Normal. Negative for fracture or focal lesion. Sinuses/Orbits: Paranasal sinuses and mastoid air cells are clear. Orbits are clear. Other: None. IMPRESSION: No acute intracranial findings.  Normal head CT for age. Electronically Signed   By: Genevive Bi M.D.   On: 07/03/2016 17:23   US Renal  Result Date: 07/02/2016 CLINICAL DATA:  Slight and back pain EXAM: RENAL / URINARY TRACT ULTRASOUND COMPLETE COMPARISON:  None. FINDINGS: Right Kidney: Length: 10.8 cm. Echogenicity and renal cortical thickness are within normal limits. No mass, perinephric fluid, or hydronephrosis visualized. No sonographically demonstrable calculus or ureterectasis. Left Kidney: Length: 10.3 cm. Echogenicity and renal cortical thickness are within normal limits. No mass, perinephric fluid, or hydronephrosis visualized. No sonographically demonstrable calculus or ureterectasis. Bladder: Appears normal for degree of bladder distention. IMPRESSION: Study within normal limits. Electronically Signed   By: Bretta Bang III M.D.   On: 07/02/2016 17:18    ASSESSMENT AND PLAN:   Active Problems:   Acute on chronic renal failure (HCC)  * acute on chronic renal failure likely due to ATN with poor by mouth intake, nausea, does have hypotension here blood pressure is borderline  continue IV hydration, monitor urine output, patient had urine retention  bladder scan showing 500 mL PVRof urine. Continue Foley catheter for monitoring. Output, continue monitoring kidney function, nephrology is consulted, seen and discussed with patient's wife.  US renal is without obstruction.  * chronic neck pain avoid high-dose narcotics,  * insomnia patient amitriptyline and Neurontin were discontinued this time because of worsening renal function, jerks. Patient may be having myoclonic jerks  on and off.   No more jerks now,. *  Hypoxia, elevated white count: Likely secondary to bibasilar atelectasis/pneumonia:   on empiric antibiotics. * multiple falls secondary to renal failure, dehydration, confusion secondary to multiple medications including Neurontin and amitriptyline, narcotics. * Altered mental status and hallucinations   Unexplained ? CT and MRI head.   Neuro and Psych consult.    NO fever or neck rigidity.    All the records are reviewed and case discussed with Care Management/Social Workerr. Management plans discussed with the patient, family and they are in agreement.  CODE STATUS: Full code  TOTAL TIME TAKING CARE OF THIS PATIENT: 35 minutes.     POSSIBLE D/C IN 1-2 DAYS, DEPENDING ON CLINICAL CONDITION.   Altamese Dilling M.D on 07/03/2016   Between 7am to 6pm - Pager - [phone]  After 6pm go to [email or web address] - password [email or web address]  [facility] [place] [facility]  Office  [phone]  CC: Primary care physician; Saralyn Pilar, DO  Note: This dictation was prepared with Dragon dictation along with smaller phrase technology. Any transcriptional errors that result from this process are unintentional.

## 2016-07-03 NOTE — Progress Notes (Signed)
Denies chest pain at this time. Windy Carinaurner,Tomeshia Pizzi K, RN6:25 AM 07/03/2016

## 2016-07-03 NOTE — Progress Notes (Signed)
Patient alone, confused and pulling at foley; Tele sitter ordered and set up with Jasmine DecemberSharon. Bed alarm set and instructed patient not to get OOB or pull on foley; Windy Carinaurner,Brolin Dambrosia K, RN 12:06 AM 07/03/2016

## 2016-07-03 NOTE — Progress Notes (Signed)
Dr. Tobi BastosPyreddy notified of critical troponin of 0.07 acknowledged;  Windy Carinaurner,Ralphie Lovelady K, RN  07/03/2016 6:39 AM

## 2016-07-04 ENCOUNTER — Inpatient Hospital Stay: Payer: Managed Care, Other (non HMO)

## 2016-07-04 DIAGNOSIS — F419 Anxiety disorder, unspecified: Secondary | ICD-10-CM

## 2016-07-04 DIAGNOSIS — F05 Delirium due to known physiological condition: Secondary | ICD-10-CM

## 2016-07-04 LAB — GLUCOSE, CAPILLARY: Glucose-Capillary: 129 mg/dL — ABNORMAL HIGH (ref 65–99)

## 2016-07-04 LAB — COMPREHENSIVE METABOLIC PANEL
ALK PHOS: 87 U/L (ref 38–126)
ALT: 95 U/L — AB (ref 17–63)
AST: 86 U/L — ABNORMAL HIGH (ref 15–41)
Albumin: 3.8 g/dL (ref 3.5–5.0)
Anion gap: 10 (ref 5–15)
BUN: 33 mg/dL — ABNORMAL HIGH (ref 6–20)
CALCIUM: 9.2 mg/dL (ref 8.9–10.3)
CO2: 28 mmol/L (ref 22–32)
CREATININE: 1.58 mg/dL — AB (ref 0.61–1.24)
Chloride: 99 mmol/L — ABNORMAL LOW (ref 101–111)
GFR, EST AFRICAN AMERICAN: 56 mL/min — AB (ref >60)
GFR, EST NON AFRICAN AMERICAN: 49 mL/min — AB (ref >60)
Glucose, Bld: 129 mg/dL — ABNORMAL HIGH (ref 65–99)
Potassium: 4 mmol/L (ref 3.5–5.1)
Sodium: 137 mmol/L (ref 135–145)
TOTAL PROTEIN: 7.8 g/dL (ref 6.5–8.1)
Total Bilirubin: 1.2 mg/dL (ref 0.3–1.2)

## 2016-07-04 LAB — CBC
HCT: 37.7 % — ABNORMAL LOW (ref 40.0–52.0)
HEMOGLOBIN: 13.4 g/dL (ref 13.0–18.0)
MCH: 28.9 pg (ref 26.0–34.0)
MCHC: 35.5 g/dL (ref 32.0–36.0)
MCV: 81.5 fL (ref 80.0–100.0)
Platelets: 274 10*3/uL (ref 150–440)
RBC: 4.63 MIL/uL (ref 4.40–5.90)
RDW: 14 % (ref 11.5–14.5)
WBC: 8.6 10*3/uL (ref 3.8–10.6)

## 2016-07-04 LAB — FOLATE: FOLATE: 12.4 ng/mL (ref >5.9)

## 2016-07-04 MED ORDER — MIRTAZAPINE 15 MG PO TABS
7.5000 mg | ORAL_TABLET | Freq: Every evening | ORAL | Status: DC | PRN
  Administered 2016-07-04: 7.5 mg via ORAL
  Filled 2016-07-04: qty 1

## 2016-07-04 MED ORDER — TAMSULOSIN HCL 0.4 MG PO CAPS
0.4000 mg | ORAL_CAPSULE | Freq: Every day | ORAL | Status: DC
Start: 2016-07-04 — End: 2016-07-05
  Administered 2016-07-04 – 2016-07-05 (×2): 0.4 mg via ORAL
  Filled 2016-07-04 (×2): qty 1

## 2016-07-04 MED ORDER — HALOPERIDOL 0.5 MG PO TABS: 0.5000 mg | ORAL_TABLET | Freq: Two times a day (BID) | ORAL | Status: DC | PRN

## 2016-07-04 NOTE — BH Assessment (Signed)
Writer informed psychiatrist (Dr. Subedi) of consult. 

## 2016-07-04 NOTE — Consult Note (Signed)
Reason for Consult: AMS Referring Physician: Dr. Elisabeth Pigeon  CC: confsuion   HPI: Brett Huber is an 51 y.o. male with a known history of Hypertension, chronic neck pain on narcotics, insomnia comes in because of confusion f associated with nausea, vomiting, decreased urine output. Patient also noted to have some jerks of hands. Multiple falls yesterday, today. Patient takes oxycodone 15 mg every 6 hours for chronic neck pain because of neck surgery. Follows up with pain management specialist syndrome. Also patient takes Neurontin, amitriptyline for insomnia.   Pt's tremor has resolved. Appear to have AKI. Mental status significantly improved and follows commands.    Past Medical History:  Diagnosis Date  . Anxiety   . Chronic pain   . DDD (degenerative disc disease)   . Hyperlipidemia   . Hypertension   . Insomnia   . Sinus congestion     Past Surgical History:  Procedure Laterality Date  . CERVICAL FUSION  2004  . CHOLECYSTECTOMY  2007  . neck and disc replacement    . SHOULDER ARTHROSCOPY WITH ROTATOR CUFF REPAIR AND SUBACROMIAL DECOMPRESSION Left 01/29/2013   Procedure: LEFT SHOULDER ARTHROSCOPY WITH ROTATOR CUFF REPAIR AND SUBACROMIAL DECOMPRESSION DISTAL CLAVICLE RESECTION;  Surgeon: Mable Paris, MD;  Location: [facility];  Service: Orthopedics;  Laterality: Left;    Family History  Problem Relation Age of Onset  . Diabetes Mother   . Stroke Father     Social History:  reports that he has never smoked. His smokeless tobacco use includes Chew. He reports that he drinks alcohol. He reports that he does not use drugs.  Allergies  Allergen Reactions  . Bee Venom Shortness Of Breath    Medications: I have reviewed the patient's current medications.  ROS: History obtained from the patient  General ROS: negative for - chills, fatigue, fever, night sweats, weight gain or weight loss Psychological ROS: negative for - behavioral disorder,  hallucinations, memory difficulties, mood swings or suicidal ideation Ophthalmic ROS: negative for - blurry vision, double vision, eye pain or loss of vision ENT ROS: negative for - epistaxis, nasal discharge, oral lesions, sore throat, tinnitus or vertigo Allergy and Immunology ROS: negative for - hives or itchy/watery eyes Hematological and Lymphatic ROS: negative for - bleeding problems, bruising or swollen lymph nodes Endocrine ROS: negative for - galactorrhea, hair pattern changes, polydipsia/polyuria or temperature intolerance Respiratory ROS: negative for - cough, hemoptysis, shortness of breath or wheezing Cardiovascular ROS: negative for - chest pain, dyspnea on exertion, edema or irregular heartbeat Gastrointestinal ROS: negative for - abdominal pain, diarrhea, hematemesis, nausea/vomiting or stool incontinence Genito-Urinary ROS: negative for - dysuria, hematuria, incontinence or urinary frequency/urgency Musculoskeletal ROS: negative for - joint swelling or muscular weakness Neurological ROS: as noted in HPI Dermatological ROS: negative for rash and skin lesion changes  Physical Examination: Blood pressure (!) 154/91, pulse 97, temperature 98.1 F (36.7 C), temperature source Oral, resp. rate 17, height 5\' 9"  (1.753 m), weight 81.9 kg (180 lb 9.6 oz), SpO2 94 %.    Neurological Examination Mental Status: Alert, oriented to name and place  Cranial Nerves: II: Discs flat bilaterally; Visual fields grossly normal, pupils equal, round, reactive to light and accommodation III,IV, VI: ptosis not present, extra-ocular motions intact bilaterally V,VII: smile symmetric, facial light touch sensation normal bilaterally VIII: hearing normal bilaterally IX,X: gag reflex present XI: bilateral shoulder shrug XII: midline tongue extension Motor: Right : Upper extremity   5/5    Left:  Upper extremity   5/5  Lower extremity   5/5     Lower extremity   5/5 Tone and bulk:normal tone  throughout; no atrophy noted Sensory: Pinprick and light touch intact throughout, bilaterally Deep Tendon Reflexes: 1+ and symmetric throughout Plantars: Right: downgoing   Left: downgoing Cerebellar: normal finger-to-nose, normal rapid alternating movements and normal heel-to-shin test Gait: not tested       Laboratory Studies:   Basic Metabolic Panel:  Recent Labs Lab 07/02/16 1629 07/03/16 0508 07/04/16 0624  NA 134* 136 137  K 4.2 4.1 4.0  CL 97* 100* 99*  CO2 23 24 28   GLUCOSE 103* 79 129*  BUN 66* 57* 33*  CREATININE 8.00* 4.45* 1.58*  CALCIUM 8.4* 8.7* 9.2    Liver Function Tests:  Recent Labs Lab 07/02/16 1629 07/04/16 0624  AST 26 86*  ALT 26 95*  ALKPHOS 45 87  BILITOT 1.1 1.2  PROT 7.3 7.8  ALBUMIN 4.2 3.8   No results for input(s): LIPASE, AMYLASE in the last 168 hours. No results for input(s): AMMONIA in the last 168 hours.  CBC:  Recent Labs Lab 07/02/16 1629 07/03/16 0508 07/04/16 0624  WBC 13.5* 12.2* 8.6  NEUTROABS 10.1*  --   --   HGB 12.3* 12.3* 13.4  HCT 35.7* 35.8* 37.7*  MCV 83.5 83.2 81.5  PLT 277 254 274    Cardiac Enzymes:  Recent Labs Lab 07/03/16 0508  TROPONINI 0.07*    BNP: Invalid input(s): POCBNP  CBG:  Recent Labs Lab 07/04/16 0741  GLUCAP 129*    Microbiology: No results found for this or any previous visit.  Coagulation Studies: No results for input(s): LABPROT, INR in the last 72 hours.  Urinalysis:  Recent Labs Lab 07/02/16 1645  COLORURINE YELLOW*  LABSPEC 1.017  PHURINE 5.0  GLUCOSEU 50*  HGBUR MODERATE*  BILIRUBINUR NEGATIVE  KETONESUR NEGATIVE  PROTEINUR 100*  NITRITE NEGATIVE  LEUKOCYTESUR NEGATIVE    Lipid Panel:     Component Value Date/Time   CHOL 178 06/04/2016 0001   TRIG 161 (H) 06/04/2016 0001   HDL 42 06/04/2016 0001   CHOLHDL 4.2 06/04/2016 0001   VLDL 32 (H) 06/04/2016 0001   LDLCALC 104 (H) 06/04/2016 0001    HgbA1C:  Lab Results  Component Value Date    HGBA1C 5.6 06/04/2016    Urine Drug Screen:  No results found for: LABOPIA, COCAINSCRNUR, LABBENZ, AMPHETMU, THCU, LABBARB  Alcohol Level: No results for input(s): ETH in the last 168 hours.  Other results: EKG: normal EKG, normal sinus rhythm, unchanged from previous tracings.  Imaging: Dg Chest 1 View  Result Date: 07/02/2016 CLINICAL DATA:  Hypoxia, confusion EXAM: CHEST 1 VIEW COMPARISON:  08/12/2015 FINDINGS: Linear atelectasis at both lung bases. No consolidation or effusion. Normal heart size. No pneumothorax. IMPRESSION: Linear bibasilar atelectasis Electronically Signed   By: Jasmine PangKim  Fujinaga M.D.   On: 07/02/2016 18:36   Ct Head Wo Contrast  Result Date: 07/03/2016 CLINICAL DATA:  Pt very confused while in CT. Per note in pt chart "Patient known to our practice from the patient. He was recently evaluated by Dr. Cherylann RatelLateef for acute renal failure. Serologies were all negative. Urine protein/creatinine ratio.*comment was truncated* EXAM: CT HEAD WITHOUT CONTRAST TECHNIQUE: Contiguous axial images were obtained from the base of the skull through the vertex without intravenous contrast. COMPARISON:  Head CT 11/04/2004 FINDINGS: Brain: No acute intracranial hemorrhage. No focal mass lesion. No CT evidence of acute infarction. No midline shift or mass  effect. No hydrocephalus. Basilar cisterns are patent. Vascular: No hyperdense vessel or unexpected calcification. Skull: Normal. Negative for fracture or focal lesion. Sinuses/Orbits: Paranasal sinuses and mastoid air cells are clear. Orbits are clear. Other: None. IMPRESSION: No acute intracranial findings.  Normal head CT for age. Electronically Signed   By: Genevive Bi M.D.   On: 07/03/2016 17:23   Mr Brain Wo Contrast  Result Date: 07/04/2016 CLINICAL DATA:  Increasing confusion.  Vomiting. EXAM: MRI HEAD WITHOUT CONTRAST TECHNIQUE: Multiplanar, multiecho pulse sequences of the brain and surrounding structures were obtained without  intravenous contrast. COMPARISON:  Head CT from yesterday FINDINGS: Brain: No acute infarction, hemorrhage, hydrocephalus, extra-axial collection or mass lesion. Few FLAIR hyperintensities in the cerebral white matter attributed to chronic microvascular insults. These are commonly encounter by the patient's age. Normal brain volume. Vascular: Normal flow voids Skull and upper cervical spine: Negative Sinuses/Orbits: Adenoid thickening symmetrically. Sinusitis, most notably mucosal thickening with fluid retained in the right maxillary antrum. IMPRESSION: 1. Negative intracranial imaging.  No explanation for symptoms. 2. Right maxillary sinusitis. Electronically Signed   By: Marnee Spring M.D.   On: 07/04/2016 10:51   US Renal  Result Date: 07/02/2016 CLINICAL DATA:  Slight and back pain EXAM: RENAL / URINARY TRACT ULTRASOUND COMPLETE COMPARISON:  None. FINDINGS: Right Kidney: Length: 10.8 cm. Echogenicity and renal cortical thickness are within normal limits. No mass, perinephric fluid, or hydronephrosis visualized. No sonographically demonstrable calculus or ureterectasis. Left Kidney: Length: 10.3 cm. Echogenicity and renal cortical thickness are within normal limits. No mass, perinephric fluid, or hydronephrosis visualized. No sonographically demonstrable calculus or ureterectasis. Bladder: Appears normal for degree of bladder distention. IMPRESSION: Study within normal limits. Electronically Signed   By: Bretta Bang III M.D.   On: 07/02/2016 17:18     Assessment/Plan: 51 y.o. male with a known history of Hypertension, chronic neck pain on narcotics, insomnia comes in because of confusion f associated with nausea, vomiting, decreased urine output. Patient also noted to have some jerks of hands. Multiple falls yesterday, today. Patient takes oxycodone 15 mg every 6 hours for chronic neck pain because of neck surgery. Follows up with pain management specialist syndrome. Also patient takes Neurontin,  amitriptyline for insomnia.   Pt's tremor has resolved. Appear to have AKI. Mental status significantly improved and follows commands.    - Pt had a CTH that didn't show any acute abnormalities - MRI ordered and done which showed sinusitis and no acute abnormality - confusion/delirium is likely related to AKI - no focal neurological abnormalities on exam and as above clinically improved - No further imaging from neuro stand point.  - Do not think that there is a need for EEG at this time.   07/04/2016, 11:36 AM

## 2016-07-04 NOTE — Progress Notes (Signed)
[facility] [place] [facility]   PATIENT NAME: Brett Huber    MR#:  [id number]  DATE OF BIRTH:  Dec 30, 1965  SUBJECTIVE:  CHIEF COMPLAINT:   Chief Complaint  Patient presents with  . Altered Mental Status  . Flank Pain     Was getting increasingly confused and having vomiting, also had shaking and tremors for last 2-3 days so brought to emergency room. Found to have severe worsening in the renal function. Started on IV fluid, also placed a Foley catheter for good urine output, had significant improvement in renal function. He appears little better today and less confused, wife is not in room yet.  REVIEW OF SYSTEMS:   Patient is confused and hallucinating so not able to give me review of system. ROS  DRUG ALLERGIES:   Allergies  Allergen Reactions  . Bee Venom Shortness Of Breath    VITALS:  Blood pressure (!) 154/91, pulse 97, temperature 98.1 F (36.7 C), temperature source Oral, resp. rate 17, height 5\' 9"  (1.753 m), weight 81.9 kg (180 lb 9.6 oz), SpO2 94 %.  PHYSICAL EXAMINATION:  GENERAL:  51 y.o.-year-old patient lying in the bed with no acute distress.  EYES: Pupils equal, round, reactive to light and accommodation. No scleral icterus. Extraocular muscles intact.  HEENT: Head atraumatic, normocephalic. Oropharynx and nasopharynx clear.  NECK:  Supple, no jugular venous distention. No thyroid enlargement, no tenderness. No rigidity. LUNGS: Normal breath sounds bilaterally, no wheezing, rales,rhonchi or crepitation. No use of accessory muscles of respiration.  CARDIOVASCULAR: S1, S2 normal. No murmurs, rubs, or gallops.  ABDOMEN: Soft, nontender, nondistended. Bowel sounds present. No organomegaly or mass.  EXTREMITIES: No pedal edema, cyanosis, or clubbing.  NEUROLOGIC: Cranial nerves II through XII are intact. Muscle strength 5/5 in all extremities. Sensation intact. Gait not checked. No tremors. PSYCHIATRIC: The patient is alert and  oriented x 2.  SKIN: No obvious rash, lesion, or ulcer.   Physical Exam LABORATORY PANEL:   CBC  Recent Labs Lab 07/04/16 0624  WBC 8.6  HGB 13.4  HCT 37.7*  PLT 274   ------------------------------------------------------------------------------------------------------------------  Chemistries   Recent Labs Lab 07/04/16 0624  NA 137  K 4.0  CL 99*  CO2 28  GLUCOSE 129*  BUN 33*  CREATININE 1.58*  CALCIUM 9.2  AST 86*  ALT 95*  ALKPHOS 87  BILITOT 1.2   ------------------------------------------------------------------------------------------------------------------  Cardiac Enzymes  Recent Labs Lab 07/03/16 0508  TROPONINI 0.07*   ------------------------------------------------------------------------------------------------------------------  RADIOLOGY:  Dg Chest 1 View  Result Date: 07/02/2016 CLINICAL DATA:  Hypoxia, confusion EXAM: CHEST 1 VIEW COMPARISON:  08/12/2015 FINDINGS: Linear atelectasis at both lung bases. No consolidation or effusion. Normal heart size. No pneumothorax. IMPRESSION: Linear bibasilar atelectasis Electronically Signed   By: Jasmine Pang M.D.   On: 07/02/2016 18:36   Ct Head Wo Contrast  Result Date: 07/03/2016 CLINICAL DATA:  Pt very confused while in CT. Per note in pt chart "Patient known to our practice from the patient. He was recently evaluated by Dr. Cherylann Ratel for acute renal failure. Serologies were all negative. Urine protein/creatinine ratio.*comment was truncated* EXAM: CT HEAD WITHOUT CONTRAST TECHNIQUE: Contiguous axial images were obtained from the base of the skull through the vertex without intravenous contrast. COMPARISON:  Head CT 11/04/2004 FINDINGS: Brain: No acute intracranial hemorrhage. No focal mass lesion. No CT evidence of acute infarction. No midline shift or mass effect. No hydrocephalus. Basilar cisterns are patent. Vascular: No hyperdense vessel or unexpected calcification. Skull: Normal.  Negative for  fracture or focal lesion. Sinuses/Orbits: Paranasal sinuses and mastoid air cells are clear. Orbits are clear. Other: None. IMPRESSION: No acute intracranial findings.  Normal head CT for age. Electronically Signed   By: Genevive BiStewart  Edmunds M.D.   On: 07/03/2016 17:23   [place] Renal  Result Date: 07/02/2016 CLINICAL DATA:  Slight and back pain EXAM: RENAL / URINARY TRACT ULTRASOUND COMPLETE COMPARISON:  None. FINDINGS: Right Kidney: Length: 10.8 cm. Echogenicity and renal cortical thickness are within normal limits. No mass, perinephric fluid, or hydronephrosis visualized. No sonographically demonstrable calculus or ureterectasis. Left Kidney: Length: 10.3 cm. Echogenicity and renal cortical thickness are within normal limits. No mass, perinephric fluid, or hydronephrosis visualized. No sonographically demonstrable calculus or ureterectasis. Bladder: Appears normal for degree of bladder distention. IMPRESSION: Study within normal limits. Electronically Signed   By: Bretta BangWilliam  Woodruff III M.D.   On: 07/02/2016 17:18    ASSESSMENT AND PLAN:   Active Problems:   Acute on chronic renal failure (HCC)  * acute on chronic renal failure likely due to ATN with poor by mouth intake, nausea, does have hypotension here blood pressure is borderline  continue IV hydration, monitor urine output, patient had urine retention  bladder scan showing 500 mL PVRof urine. Continue Foley catheter for monitoring. Output, continue monitoring kidney function, nephrology is consulted, seen and discussed with patient's wife.  US renal is without obstruction.  significant improvement in renal func.  * chronic neck pain avoid high-dose narcotics,  * insomnia patient amitriptyline and Neurontin were discontinued this time because of worsening renal function, jerks. Patient may be having myoclonic jerks  on and off.   No more jerks now,. * Hypoxia, elevated white count: Likely secondary to bibasilar atelectasis/pneumonia:   on  empiric antibiotics.will give for total 5 days. * multiple falls secondary to renal failure, dehydration, confusion secondary to multiple medications including Neurontin and amitriptyline, narcotics. * Altered mental status and hallucinations   Unexplained ? CT is negative and waiting on MRI head.   TSH was normal last month, check Vit B1 and folic acid level.   Neuro and Psych consult.    NO fever or neck rigidity.   All the records are reviewed and case discussed with Care Management/Social Workerr. Management plans discussed with the patient, family and they are in agreement.  CODE STATUS: Full code  TOTAL TIME TAKING CARE OF THIS PATIENT: 35 minutes.     POSSIBLE D/C IN 1-2 DAYS, DEPENDING ON CLINICAL CONDITION.   Altamese DillingVACHHANI, Siyona Coto M.D on 07/04/2016   Between 7am to 6pm - Pager - [phone]  After 6pm go to [email or web address] - password [email or web address]  [facility]  Office  [phone]  CC: Primary care physician; Saralyn [name] Karamalegos, DO  Note: This dictation was prepared with Dragon dictation along with smaller phrase technology. Any transcriptional errors that result from this process are unintentional.

## 2016-07-04 NOTE — Progress Notes (Signed)
Subjective:  Patient known to our practice from the patient.  He was recently evaluated by Dr. Cherylann Ratel for acute renal failure.  Serologies were all negative.  Urine protein/creatinine ratio was normal.  Renal failure was thought to be secondary to excessive nonsteroidal use.   He presented to the emergency room for altered mental status and was found to be in renal failure. His serum creatinine was 8.0 at the time of presentation.  He was found to have urinary retention.  Foley catheter was placed.  Serum creatinine  improved to 4.45 and further improved to 1.6 today Urine output of 3100 cc as noted in the last 24 hours  She feels like his mental confusion is improving He has been evaluated by neurologist.   Objective:  Vital signs in last 24 hours:  Temp:  [98 F (36.7 C)-98.3 F (36.8 C)] 98.1 F (36.7 C) (01/07 0742) Pulse Rate:  [97-107] 97 (01/07 0743) Resp:  [17-20] 17 (01/07 0742) BP: (120-157)/(76-97) 154/91 (01/07 0743) SpO2:  [94 %-96 %] 94 % (01/07 0742) Weight:  [81.9 kg (180 lb 9.6 oz)] 81.9 kg (180 lb 9.6 oz) (01/07 0449)  Weight change: 0.272 kg (9.6 oz) Filed Weights   07/02/16 1945 07/03/16 0500 07/04/16 0449  Weight: 84.5 kg (186 lb 4.8 oz) 82.6 kg (182 lb 1.6 oz) 81.9 kg (180 lb 9.6 oz)    Intake/Output:    Intake/Output Summary (Last 24 hours) at 07/04/16 1320 Last data filed at 07/04/16 1206  Gross per 24 hour  Intake          1188.64 ml  Output             3775 ml  Net         -2586.36 ml     Physical Exam: General: No acute distress, sitting in the bed  HEENT Anicteric, moist oral mucous membranes  Neck supple  Pulm/lungs Normal breathing effort, clear to auscultation  CVS/Heart No rub  Abdomen:  Soft , nontender  Extremities: No edema  Neurologic: Not completely back to baseline but much improved mentation   Skin: No acute rashes  Foley present       Basic Metabolic Panel:   Recent Labs Lab 07/02/16 1629 07/03/16 0508  07/04/16 0624  NA 134* 136 137  K 4.2 4.1 4.0  CL 97* 100* 99*  CO2 23 24 28   GLUCOSE 103* 79 129*  BUN 66* 57* 33*  CREATININE 8.00* 4.45* 1.58*  CALCIUM 8.4* 8.7* 9.2     CBC:  Recent Labs Lab 07/02/16 1629 07/03/16 0508 07/04/16 0624  WBC 13.5* 12.2* 8.6  NEUTROABS 10.1*  --   --   HGB 12.3* 12.3* 13.4  HCT 35.7* 35.8* 37.7*  MCV 83.5 83.2 81.5  PLT 277 254 274      Microbiology:  No results found for this or any previous visit (from the past 720 hour(s)).  Coagulation Studies: No results for input(s): LABPROT, INR in the last 72 hours.  Urinalysis:  Recent Labs  07/02/16 1645  COLORURINE YELLOW*  LABSPEC 1.017  PHURINE 5.0  GLUCOSEU 50*  HGBUR MODERATE*  BILIRUBINUR NEGATIVE  KETONESUR NEGATIVE  PROTEINUR 100*  NITRITE NEGATIVE  LEUKOCYTESUR NEGATIVE      Imaging: Dg Chest 1 View  Result Date: 07/02/2016 CLINICAL DATA:  Hypoxia, confusion EXAM: CHEST 1 VIEW COMPARISON:  08/12/2015 FINDINGS: Linear atelectasis at both lung bases. No consolidation or effusion. Normal heart size. No pneumothorax. IMPRESSION: Linear bibasilar atelectasis Electronically Signed  By: Jasmine Pang M.D.   On: 07/02/2016 18:36   Ct Head Wo Contrast  Result Date: 07/03/2016 CLINICAL DATA:  Pt very confused while in CT. Per note in pt chart "Patient known to our practice from the patient. He was recently evaluated by Dr. Cherylann Ratel for acute renal failure. Serologies were all negative. Urine protein/creatinine ratio.*comment was truncated* EXAM: CT HEAD WITHOUT CONTRAST TECHNIQUE: Contiguous axial images were obtained from the base of the skull through the vertex without intravenous contrast. COMPARISON:  Head CT 11/04/2004 FINDINGS: Brain: No acute intracranial hemorrhage. No focal mass lesion. No CT evidence of acute infarction. No midline shift or mass effect. No hydrocephalus. Basilar cisterns are patent. Vascular: No hyperdense vessel or unexpected calcification. Skull: Normal.  Negative for fracture or focal lesion. Sinuses/Orbits: Paranasal sinuses and mastoid air cells are clear. Orbits are clear. Other: None. IMPRESSION: No acute intracranial findings.  Normal head CT for age. Electronically Signed   By: Genevive Bi M.D.   On: 07/03/2016 17:23   Mr Brain Wo Contrast  Result Date: 07/04/2016 CLINICAL DATA:  Increasing confusion.  Vomiting. EXAM: MRI HEAD WITHOUT CONTRAST TECHNIQUE: Multiplanar, multiecho pulse sequences of the brain and surrounding structures were obtained without intravenous contrast. COMPARISON:  Head CT from yesterday FINDINGS: Brain: No acute infarction, hemorrhage, hydrocephalus, extra-axial collection or mass lesion. Few FLAIR hyperintensities in the cerebral white matter attributed to chronic microvascular insults. These are commonly encounter by the patient's age. Normal brain volume. Vascular: Normal flow voids Skull and upper cervical spine: Negative Sinuses/Orbits: Adenoid thickening symmetrically. Sinusitis, most notably mucosal thickening with fluid retained in the right maxillary antrum. IMPRESSION: 1. Negative intracranial imaging.  No explanation for symptoms. 2. Right maxillary sinusitis. Electronically Signed   By: Marnee Spring M.D.   On: 07/04/2016 10:51   US Renal  Result Date: 07/02/2016 CLINICAL DATA:  Slight and back pain EXAM: RENAL / URINARY TRACT ULTRASOUND COMPLETE COMPARISON:  None. FINDINGS: Right Kidney: Length: 10.8 cm. Echogenicity and renal cortical thickness are within normal limits. No mass, perinephric fluid, or hydronephrosis visualized. No sonographically demonstrable calculus or ureterectasis. Left Kidney: Length: 10.3 cm. Echogenicity and renal cortical thickness are within normal limits. No mass, perinephric fluid, or hydronephrosis visualized. No sonographically demonstrable calculus or ureterectasis. Bladder: Appears normal for degree of bladder distention. IMPRESSION: Study within normal limits. Electronically  Signed   By: Bretta Bang III M.D.   On: 07/02/2016 17:18     Medications:   . sodium chloride 50 mL/hr at 07/04/16 0918   . aspirin  81 mg Oral Daily  . cefTRIAXone  1 g Intravenous Q24H  . docusate sodium  100 mg Oral BID  . fluticasone  2 spray Each Nare Daily  . heparin  5,000 Units Subcutaneous Q8H  . Influenza vac split quadrivalent PF  0.5 mL Intramuscular Tomorrow-1000  . pneumococcal 23 valent vaccine  0.5 mL Intramuscular Tomorrow-1000   acetaminophen **OR** acetaminophen, bisacodyl, HYDROcodone-acetaminophen, hydrOXYzine, nitroGLYCERIN, ondansetron **OR** ondansetron (ZOFRAN) IV  Assessment/ Plan:  51 y.o. male  with hypertension, chronic back pain, insomnia, presented for confusion, nausea, vomiting, decreased output and is found to have cute renal failure  1.  Acute renal failure with urinary retnetion Urinary retention may be due to multiple medications he is taking at home including amitriptyline, gabapentin, hydroxyzine, narcotics Foley catheter has been placed Serum creatinine has improved to 1.6 Consider removing Foley and voiding trial on Monday If urinary retention persists, he will need to be discharged with a Foley  catheter and seen by urologist in about 1-2 weeks  2. Altered mental status Probably due to toxicity of psychoactive medications that developed due to renal failure Improved today    LOS: 2 Eldridge Marcott 1/7/20181:20 PM

## 2016-07-04 NOTE — Consult Note (Signed)
[facility] Consult   Reason for Consult:  Confusion, hallucination Referring Physician:  Dr. Anselm Jungling, V Patient Identification: Brett Huber MRN:  [id number] Principal Diagnosis: <principal problem not specified> Diagnosis:  Unspecified anxiety d/o, Chr insomnia, r/o resolving delirium Patient Active Problem List   Diagnosis Date Noted  . Acute on chronic renal failure ([facility]) [N17.9, N18.9] 07/02/2016  . Acute on chronic kidney failure ([facility]) [N17.9, N18.9] 06/08/2016  . Colon cancer screening [Z12.11] 06/04/2016  . Abnormal glucose [R73.09] 06/04/2016  . Hypertension [I10] 07/23/2015  . Hyperlipidemia [E78.5] 07/23/2015  . Anxiety and depression [F41.8] 07/23/2015  . Chronic pain [G89.29] 07/23/2015  . Insomnia [G47.00] 07/23/2015  . Erectile dysfunction [N52.9] 07/23/2015  . Vitamin D deficiency [E55.9] 07/23/2015    Total Time spent with patient: 60 min  Subjective:   Brett Huber is a 51 y.o. male   "I feel like it was "a pause" , "slow motion", not really voice",  I have sleep problem"  HPI:  51 y/o CM with  Chronic insomnia admitted for medical issues including AMS, vomiting, ac on chr renal impairment.  Psych consult called for pt confused and reporting hearing voices yesterday. Pt reports  it was "a pause" , "slow motion", not really voice", reports he doses not have complete memory of it, worsening at night for last 4 days. Denies any similar symptoms in the past. Denies psych hx except chr insomnia treated with Amitriptyline but has been stopped after hospitalization. Pt denies persistent depression, but endorses some anxiety for last several days. Denies hallucination at this time. Denies paranoia. Pt appears anxious but mostly coherent and logical. Denies SI/HI. Reports some financial stressors.  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  Past Psychiatric History:  Denies psych hx except chronic insomnia treated with Amitriptyline but has been stopped after hospitalization No h/o psych hospitalization. Denies suicide attempt. Denies h/o Hi or aggression.  Risk to Self: Is patient at risk for suicide?: No Risk to Others:  no Prior Inpatient Therapy:  no Prior Outpatient Therapy:  no Substance use- denies drug use, occasional alcohol   Past Medical History:  Past Medical History:  Diagnosis Date  . Anxiety   . Chronic pain   . DDD (degenerative disc disease)   . Hyperlipidemia   . Hypertension   . Insomnia   . Sinus congestion     Past Surgical History:  Procedure Laterality Date  . CERVICAL FUSION  2004  . CHOLECYSTECTOMY  2007  . neck and disc replacement    . SHOULDER ARTHROSCOPY WITH ROTATOR CUFF REPAIR AND SUBACROMIAL DECOMPRESSION Left 01/29/2013   Procedure: LEFT SHOULDER ARTHROSCOPY WITH ROTATOR CUFF REPAIR AND SUBACROMIAL DECOMPRESSION DISTAL CLAVICLE RESECTION;  Surgeon: Nita Sells, MD;  Location: [facility];  Service: Orthopedics;  Laterality: Left;   Family History:  Family History  Problem Relation Age of Onset  . Diabetes Mother   . Stroke Father    Family Psychiatric  History: denies Social History:  History  Alcohol Use  . Yes    Comment: occ     History  Drug Use No    Social History   Social History  . Marital status: Married    Spouse name: N/A  . Number of children: N/A  . Years of education: N/A  Social History Main Topics  . Smoking status: Never Smoker  . Smokeless tobacco: Current User    Types: Chew  . Alcohol use Yes     Comment: occ  . Drug use: No  . Sexual activity: Not Asked   Other Topics Concern  . None   Social History Narrative  . None   Additional Social History: Born in [place], raised in [place]. In [place] for several yrs. Lives with wife and 2 kids. 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     Allergies:   Allergies  Allergen Reactions  . Bee Venom Shortness Of Breath    Labs:  Results for orders placed or performed during the hospital encounter of 07/02/16 (from the past 48 hour(s))  ANA Comprehensive Panel     Status: None   Collection Time: 07/02/16  3:58 PM  Result Value Ref Range   ds DNA Ab <1 0 - 9 IU/mL    Comment: (NOTE)                                   Negative      <5                                   Equivocal  5 - 9                                   Positive      >9    Ribonucleic Protein 0.4 0.0 - 0.9 AI   ENA SM Ab Ser-aCnc <0.2 0.0 - 0.9 AI   Scleroderma (Scl-70) (ENA) Antibody, IgG <0.2 0.0 - 0.9 AI   SSA (Ro) (ENA) Antibody, IgG <0.2 0.0 - 0.9 AI   SSB (La) (ENA) Antibody, IgG <0.2 0.0 - 0.9 AI   Chromatin Ab SerPl-aCnc <0.2 0.0 - 0.9 AI   Anti JO-1 <0.2 0.0 - 0.9 AI   Centromere Ab Screen <0.2 0.0 - 0.9 AI   See below: Comment     Comment: (NOTE) Autoantibody                       Disease Association ------------------------------------------------------------                        Condition                  Frequency ---------------------   ------------------------   --------- Antinuclear Antibody,    SLE, mixed connective Direct (ANA-D)           tissue diseases ---------------------   ------------------------   --------- dsDNA                    SLE                        40 - 60% ---------------------   ------------------------   --------- Chromatin                Drug induced SLE                90%  SLE                        48 - 97% ---------------------   ------------------------   --------- SSA (Ro)                 SLE                        25 - 35%                         Sjogren's Syndrome         40 - 70%                         Neonatal Lupus                 100% ---------------------   ------------------------   --------- SSB (La)                 SLE                               10%                         Sjogren's Syndrome              30% ---------------------   -----------------------    --------- Sm (anti-Smith)          SLE                        15 - 30% ---------------------   -----------------------    --------- RNP                      Mixed Connective Tissue                         Disease                         95% (U1 nRNP,                SLE                        30 - 50% anti-ribonucleoprotein)  Polymyositis and/or                         Dermatomyositis                 20% ---------------------   ------------------------   --------- Scl-70 (antiDNA          Scleroderma (diffuse)      20 - 35% topoisomerase)           Crest                           13% ---------------------   ------------------------   --------- Jo-1                     Polymyositis and/or                         Dermatomyositis  20 - 40% ---------------------   ------------------------   --------- Centromere B             Scleroderma -  Crest                         variant                         80% Performed At: [facility] [address] [place], [place] [id number] Mila Homer MD JY:[id number]   Sedimentation rate     Status: Abnormal   Collection Time: 07/02/16  3:58 PM  Result Value Ref Range   Sed Rate 46 (H) 0 - 20 mm/hr  CBC with Differential     Status: Abnormal   Collection Time: 07/02/16  4:29 PM  Result Value Ref Range   WBC 13.5 (H) 3.8 - 10.6 K/uL   RBC 4.28 (L) 4.40 - 5.90 MIL/uL   Hemoglobin 12.3 (L) 13.0 - 18.0 g/dL   HCT 81.1 (L) 27.9 - 36.8 %   MCV 83.5 80.0 - 100.0 fL   MCH 28.8 26.0 - 34.0 pg   MCHC 34.5 32.0 - 36.0 g/dL   RDW 28.6 13.7 - 12.4 %   Platelets 277 150 - 440 K/uL   Neutrophils Relative % 75 %   Neutro Abs 10.1 (H) 1.4 - 6.5 K/uL   Lymphocytes Relative 10 %   Lymphs Abs 1.3 1.0 - 3.6 K/uL   Monocytes Relative 12 %   Monocytes Absolute 1.6 (H) 0.2 - 1.0 K/uL   Eosinophils Relative 3 %   Eosinophils  Absolute 0.4 0 - 0.7 K/uL   Basophils Relative 0 %   Basophils Absolute 0.0 0 - 0.1 K/uL  Comprehensive metabolic panel     Status: Abnormal   Collection Time: 07/02/16  4:29 PM  Result Value Ref Range   Sodium 134 (L) 135 - 145 mmol/L   Potassium 4.2 3.5 - 5.1 mmol/L   Chloride 97 (L) 101 - 111 mmol/L   CO2 23 22 - 32 mmol/L   Glucose, Bld 103 (H) 65 - 99 mg/dL   BUN 66 (H) 6 - 20 mg/dL   Creatinine, Ser 4.25 (H) 0.61 - 1.24 mg/dL   Calcium 8.4 (L) 8.9 - 10.3 mg/dL   Total Protein 7.3 6.5 - 8.1 g/dL   Albumin 4.2 3.5 - 5.0 g/dL   AST 26 15 - 41 U/L   ALT 26 17 - 63 U/L   Alkaline Phosphatase 45 38 - 126 U/L   Total Bilirubin 1.1 0.3 - 1.2 mg/dL   GFR calc non Af Amer 7 (L) >60 mL/min   GFR calc Af Amer 8 (L) >60 mL/min    Comment: (NOTE) The eGFR has been calculated using the CKD EPI equation. This calculation has not been validated in all clinical situations. eGFR's persistently <60 mL/min signify possible Chronic Kidney Disease.    Anion gap 14 5 - 15  Urinalysis, Complete w Microscopic     Status: Abnormal   Collection Time: 07/02/16  4:45 PM  Result Value Ref Range   Color, Urine YELLOW (A) YELLOW   APPearance HAZY (A) CLEAR   Specific Gravity, Urine 1.017 1.005 - 1.030   pH 5.0 5.0 - 8.0   Glucose, UA 50 (A) NEGATIVE mg/dL   Hgb urine dipstick MODERATE (A) NEGATIVE   Bilirubin Urine NEGATIVE NEGATIVE   Ketones, ur NEGATIVE NEGATIVE mg/dL   Protein,  ur 100 (A) NEGATIVE mg/dL   Nitrite NEGATIVE NEGATIVE   Leukocytes, UA NEGATIVE NEGATIVE   RBC / HPF 0-5 0 - 5 RBC/hpf   WBC, UA 0-5 0 - 5 WBC/hpf   Bacteria, UA RARE (A) NONE SEEN   Squamous Epithelial / LPF 0-5 (A) NONE SEEN   Mucous PRESENT    Hyaline Casts, UA PRESENT    Amorphous Crystal PRESENT   Protein / creatinine ratio, urine     Status: Abnormal   Collection Time: 07/02/16  4:45 PM  Result Value Ref Range   Creatinine, Urine 242 mg/dL   Total Protein, Urine 146 mg/dL    Comment: RESULT CONFIRMED BY  MANUAL DILUTION NO NORMAL RANGE ESTABLISHED FOR THIS TEST    Protein Creatinine Ratio 0.60 (H) 0.00 - 0.15 mg/mg[Cre]  Basic metabolic panel     Status: Abnormal   Collection Time: 07/03/16  5:08 AM  Result Value Ref Range   Sodium 136 135 - 145 mmol/L   Potassium 4.1 3.5 - 5.1 mmol/L   Chloride 100 (L) 101 - 111 mmol/L   CO2 24 22 - 32 mmol/L   Glucose, Bld 79 65 - 99 mg/dL   BUN 57 (H) 6 - 20 mg/dL   Creatinine, Ser 4.45 (H) 0.61 - 1.24 mg/dL   Calcium 8.7 (L) 8.9 - 10.3 mg/dL   GFR calc non Af Amer 14 (L) >60 mL/min   GFR calc Af Amer 16 (L) >60 mL/min    Comment: (NOTE) The eGFR has been calculated using the CKD EPI equation. This calculation has not been validated in all clinical situations. eGFR's persistently <60 mL/min signify possible Chronic Kidney Disease.    Anion gap 12 5 - 15  CBC     Status: Abnormal   Collection Time: 07/03/16  5:08 AM  Result Value Ref Range   WBC 12.2 (H) 3.8 - 10.6 K/uL   RBC 4.31 (L) 4.40 - 5.90 MIL/uL   Hemoglobin 12.3 (L) 13.0 - 18.0 g/dL   HCT 35.8 (L) 40.0 - 52.0 %   MCV 83.2 80.0 - 100.0 fL   MCH 28.6 26.0 - 34.0 pg   MCHC 34.4 32.0 - 36.0 g/dL   RDW 14.6 (H) 11.5 - 14.5 %   Platelets 254 150 - 440 K/uL  Troponin I     Status: Abnormal   Collection Time: 07/03/16  5:08 AM  Result Value Ref Range   Troponin I 0.07 (HH) <0.03 ng/mL    Comment: CRITICAL RESULT CALLED TO, READ BACK BY AND VERIFIED WITH MARCELLA TURNER AT 0539 ON 07/03/16 [place].   CBC     Status: Abnormal   Collection Time: 07/04/16  6:24 AM  Result Value Ref Range   WBC 8.6 3.8 - 10.6 K/uL   RBC 4.63 4.40 - 5.90 MIL/uL   Hemoglobin 13.4 13.0 - 18.0 g/dL   HCT 37.7 (L) 40.0 - 52.0 %   MCV 81.5 80.0 - 100.0 fL   MCH 28.9 26.0 - 34.0 pg   MCHC 35.5 32.0 - 36.0 g/dL   RDW 14.0 11.5 - 14.5 %   Platelets 274 150 - 440 K/uL  Comprehensive metabolic panel     Status: Abnormal   Collection Time: 07/04/16  6:24 AM  Result Value Ref Range   Sodium 137 135 - 145 mmol/L    Potassium 4.0 3.5 - 5.1 mmol/L   Chloride 99 (L) 101 - 111 mmol/L   CO2 28 22 - 32 mmol/L   Glucose, Bld  129 (H) 65 - 99 mg/dL   BUN 33 (H) 6 - 20 mg/dL   Creatinine, Ser 2.77 (H) 0.61 - 1.24 mg/dL   Calcium 9.2 8.9 - 38.1 mg/dL   Total Protein 7.8 6.5 - 8.1 g/dL   Albumin 3.8 3.5 - 5.0 g/dL   AST 86 (H) 15 - 41 U/L   ALT 95 (H) 17 - 63 U/L   Alkaline Phosphatase 87 38 - 126 U/L   Total Bilirubin 1.2 0.3 - 1.2 mg/dL   GFR calc non Af Amer 49 (L) >60 mL/min   GFR calc Af Amer 56 (L) >60 mL/min    Comment: (NOTE) The eGFR has been calculated using the CKD EPI equation. This calculation has not been validated in all clinical situations. eGFR's persistently <60 mL/min signify possible Chronic Kidney Disease.    Anion gap 10 5 - 15  Glucose, capillary     Status: Abnormal   Collection Time: 07/04/16  7:41 AM  Result Value Ref Range   Glucose-Capillary 129 (H) 65 - 99 mg/dL   Comment 1 Notify RN   Folate     Status: None   Collection Time: 07/04/16  8:44 AM  Result Value Ref Range   Folate 12.4 >5.9 ng/mL    Current Facility-Administered Medications  Medication Dose Route Frequency Provider Last Rate Last Dose  . 0.9 %  sodium chloride infusion   Intravenous Continuous Katha Hamming, MD 50 mL/hr at 07/04/16 0918    . acetaminophen (TYLENOL) tablet 650 mg  650 mg Oral Q6H PRN Katha Hamming, MD       Or  . acetaminophen (TYLENOL) suppository 650 mg  650 mg Rectal Q6H PRN Katha Hamming, MD      . aspirin chewable tablet 81 mg  81 mg Oral Daily Pavan Pyreddy, MD   81 mg at 07/04/16 1000  . bisacodyl (DULCOLAX) EC tablet 5 mg  5 mg Oral Daily PRN Katha Hamming, MD      . cefTRIAXone (ROCEPHIN) IVPB 1 g  1 g Intravenous Q24H Katha Hamming, MD   1 g at 07/03/16 2145  . docusate sodium (COLACE) capsule 100 mg  100 mg Oral BID Katha Hamming, MD   100 mg at 07/04/16 1319  . fluticasone (FLONASE) 50 MCG/ACT nasal spray 2 spray  2 spray Each Nare Daily  Katha Hamming, MD   2 spray at 07/03/16 1102  . heparin injection 5,000 Units  5,000 Units Subcutaneous Q8H Katha Hamming, MD   5,000 Units at 07/04/16 0502  . HYDROcodone-acetaminophen (NORCO/VICODIN) 5-325 MG per tablet 1-2 tablet  1-2 tablet Oral Q4H PRN Katha Hamming, MD      . hydrOXYzine (ATARAX/VISTARIL) tablet 25-50 mg  25-50 mg Oral QHS PRN Katha Hamming, MD      . Influenza vac split quadrivalent PF (FLUARIX) injection 0.5 mL  0.5 mL Intramuscular Tomorrow-1000 Katha Hamming, MD      . nitroGLYCERIN (NITROSTAT) SL tablet 0.4 mg  0.4 mg Sublingual Q5 min PRN Ihor Austin, MD      . ondansetron (ZOFRAN) tablet 4 mg  4 mg Oral Q6H PRN Katha Hamming, MD       Or  . ondansetron (ZOFRAN) injection 4 mg  4 mg Intravenous Q6H PRN Katha Hamming, MD      . pneumococcal 23 valent vaccine (PNU-IMMUNE) injection 0.5 mL  0.5 mL Intramuscular Tomorrow-1000 Katha Hamming, MD        Musculoskeletal: Gait & Station: no EPS, pt laying in  bed Patient  leans:   Psychiatric Specialty Exam: Physical Exam  ROS  Blood pressure (!) 154/91, pulse 97, temperature 98.1 F (36.7 C), temperature source Oral, resp. rate 17, height '5\' 9"'$  (1.753 m), weight 81.9 kg (180 lb 9.6 oz), SpO2 94 %.Body mass index is 26.67 kg/m.  General Appearance: appears older than age , thin built  Eye Contact:  fair  Speech:  slow  Volume:  normal  Mood:  ok  Affect:  anxious  Thought Process:  Goal directed  Orientation:  TPP  Thought Content:    Suicidal Thoughts:  denies  Homicidal Thoughts:  denies  Memory:  longterm intact, immediate- impaired  Judgement:  fair  Insight:  fair  Psychomotor Activity:  normal  Concentration:  limited  Recall:  2/3  Fund of Knowledge:  average  Language:  intact  Akathisia:  none  Handed:    AIMS (if indicated):     Assets:    ADL's:  Need some assistance  Cognition:  Some impaired recent memory  Sleep:        Treatment Plan  Summary: Patient's presentation and hx is consistent with resolving delirium with anxiety and chronic insomnia. Pt denies SI/Hi.  Recommend-  1. Haldol 0.5 mg po bid/prn for confusion/agitation.  2. Do not restart Amitriptyline.  3 . Start Remeron 7.5 mg po qhs for anxiety/sleep. May increase dose to 15 mg qhs if tolerated.  4. Call with questions.  Thank you for consult.   Disposition:  Pt does not meet criteria for inpatient psych. Will need psych f/u at time of d/c.  Lenward Chancellor, MD 07/04/2016 2:50 PM

## 2016-07-04 NOTE — Progress Notes (Signed)
[facility] [place] at [facility]   PATIENT NAME: Brett Huber    MR#:  [id number]  DATE OF BIRTH:  [birth year]  SUBJECTIVE:  CHIEF COMPLAINT:   Chief Complaint  Patient presents with  . Altered Mental Status  . Flank Pain     Was getting increasingly confused and having vomiting, also had shaking and tremors for last 2-3 days so brought to emergency room. Found to have severe worsening in the renal function. Started on IV fluid, also placed a Foley catheter for good urine output, had significant improvement in renal function. He appears little better today and less confused, wife is not in room yet.  REVIEW OF SYSTEMS:   Patient is confused and hallucinating so not able to give me review of system. ROS  DRUG ALLERGIES:   Allergies  Allergen Reactions  . Bee Venom Shortness Of Breath    VITALS:  Blood pressure 136/86, pulse 97, temperature 98.1 F (36.7 C), temperature source Oral, resp. rate 16, height 5\' 9"  (1.753 m), weight 81.9 kg (180 lb 9.6 oz), SpO2 99 %.  PHYSICAL EXAMINATION:  GENERAL:  51 y.o.-year-old patient lying in the bed with no acute distress.  EYES: Pupils equal, round, reactive to light and accommodation. No scleral icterus. Extraocular muscles intact.  HEENT: Head atraumatic, normocephalic. Oropharynx and nasopharynx clear.  NECK:  Supple, no jugular venous distention. No thyroid enlargement, no tenderness. No rigidity. LUNGS: Normal breath sounds bilaterally, no wheezing, rales,rhonchi or crepitation. No use of accessory muscles of respiration.  CARDIOVASCULAR: S1, S2 normal. No murmurs, rubs, or gallops.  ABDOMEN: Soft, nontender, nondistended. Bowel sounds present. No organomegaly or mass.  EXTREMITIES: No pedal edema, cyanosis, or clubbing.  NEUROLOGIC: Cranial nerves II through XII are intact. Muscle strength 5/5 in all extremities. Sensation intact. Gait not checked. No tremors. PSYCHIATRIC: The patient is alert and oriented x  2.  SKIN: No obvious rash, lesion, or ulcer.   Physical Exam LABORATORY PANEL:   CBC  Recent Labs Lab 07/04/16 0624  WBC 8.6  HGB 13.4  HCT 37.7*  PLT 274   ------------------------------------------------------------------------------------------------------------------  Chemistries   Recent Labs Lab 07/04/16 0624  NA 137  K 4.0  CL 99*  CO2 28  GLUCOSE 129*  BUN 33*  CREATININE 1.58*  CALCIUM 9.2  AST 86*  ALT 95*  ALKPHOS 87  BILITOT 1.2   ------------------------------------------------------------------------------------------------------------------  Cardiac Enzymes  Recent Labs Lab 07/03/16 0508  TROPONINI 0.07*   ------------------------------------------------------------------------------------------------------------------  RADIOLOGY:  Dg Chest 1 View  Result Date: 07/02/2016 CLINICAL DATA:  Hypoxia, confusion EXAM: CHEST 1 VIEW COMPARISON:  08/12/2015 FINDINGS: Linear atelectasis at both lung bases. No consolidation or effusion. Normal heart size. No pneumothorax. IMPRESSION: Linear bibasilar atelectasis Electronically Signed   By: Jasmine Pang M.D.   On: 07/02/2016 18:36   Ct Head Wo Contrast  Result Date: 07/03/2016 CLINICAL DATA:  Pt very confused while in CT. Per note in pt chart "Patient known to our practice from the patient. He was recently evaluated by Dr. Cherylann Ratel for acute renal failure. Serologies were all negative. Urine protein/creatinine ratio.*comment was truncated* EXAM: CT HEAD WITHOUT CONTRAST TECHNIQUE: Contiguous axial images were obtained from the base of the skull through the vertex without intravenous contrast. COMPARISON:  Head CT 11/04/2004 FINDINGS: Brain: No acute intracranial hemorrhage. No focal mass lesion. No CT evidence of acute infarction. No midline shift or mass effect. No hydrocephalus. Basilar cisterns are patent. Vascular: No hyperdense vessel or unexpected calcification. Skull: Normal. Negative  for fracture or focal  lesion. Sinuses/Orbits: Paranasal sinuses and mastoid air cells are clear. Orbits are clear. Other: None. IMPRESSION: No acute intracranial findings.  Normal head CT for age. Electronically Signed   By: Genevive BiStewart  Edmunds M.D.   On: 07/03/2016 17:23   Mr Brain Wo Contrast  Result Date: 07/04/2016 CLINICAL DATA:  Increasing confusion.  Vomiting. EXAM: MRI HEAD WITHOUT CONTRAST TECHNIQUE: Multiplanar, multiecho pulse sequences of the brain and surrounding structures were obtained without intravenous contrast. COMPARISON:  Head CT from yesterday FINDINGS: Brain: No acute infarction, hemorrhage, hydrocephalus, extra-axial collection or mass lesion. Few FLAIR hyperintensities in the cerebral white matter attributed to chronic microvascular insults. These are commonly encounter by the patient's age. Normal brain volume. Vascular: Normal flow voids Skull and upper cervical spine: Negative Sinuses/Orbits: Adenoid thickening symmetrically. Sinusitis, most notably mucosal thickening with fluid retained in the right maxillary antrum. IMPRESSION: 1. Negative intracranial imaging.  No explanation for symptoms. 2. Right maxillary sinusitis. Electronically Signed   By: Marnee SpringJonathon  Watts M.D.   On: 07/04/2016 10:51   [place] Renal  Result Date: 07/02/2016 CLINICAL DATA:  Slight and back pain EXAM: RENAL / URINARY TRACT ULTRASOUND COMPLETE COMPARISON:  None. FINDINGS: Right Kidney: Length: 10.8 cm. Echogenicity and renal cortical thickness are within normal limits. No mass, perinephric fluid, or hydronephrosis visualized. No sonographically demonstrable calculus or ureterectasis. Left Kidney: Length: 10.3 cm. Echogenicity and renal cortical thickness are within normal limits. No mass, perinephric fluid, or hydronephrosis visualized. No sonographically demonstrable calculus or ureterectasis. Bladder: Appears normal for degree of bladder distention. IMPRESSION: Study within normal limits. Electronically Signed   By: Bretta BangWilliam  Woodruff III  M.D.   On: 07/02/2016 17:18    ASSESSMENT AND PLAN:   Active Problems:   Acute on chronic renal failure (HCC)  * acute on chronic renal failure likely due to ATN with poor by mouth intake, nausea, does have hypotension here blood pressure is borderline  continue IV hydration, monitor urine output, patient had urine retention  bladder scan showing 500 mL PVRof urine. Continue Foley catheter for monitoring. Output, continue monitoring kidney function, nephrology is consulted, seen and discussed with patient's wife.  US renal is without obstruction.  significant improvement in renal func.  appreciated nephrology help.  * chronic neck pain - avoid high-dose narcotics.  * insomnia patient amitriptyline and Neurontin were discontinued this time because of worsening renal function, jerks. Patient may be having myoclonic jerks  on and off.   No more jerks now,.   Appreciated psych help, no amitryptiline, start remeron.  * Hypoxia, elevated white count: Likely secondary to bibasilar atelectasis/pneumonia:   on empiric antibiotics.will give for total 5 days. * multiple falls secondary to renal failure, dehydration, confusion secondary to multiple medications including Neurontin and amitriptyline, narcotics. * Altered mental status and hallucinations   Unexplained ? CT is negative and negative MRI head.   TSH was normal last month, check Vit B1 and folic acid level.   Neuro and Psych consult.    NO fever or neck rigidity.   Now improving with improving renal func.   All the records are reviewed and case discussed with Care Management/Social Workerr. Management plans discussed with the patient, family and they are in agreement.  CODE STATUS: Full code  TOTAL TIME TAKING CARE OF THIS PATIENT: 35 minutes.     POSSIBLE D/C IN 1-2 DAYS, DEPENDING ON CLINICAL CONDITION.   Altamese DillingVACHHANI, Chamya Hunton M.D on 07/04/2016   Between 7am to 6pm - Pager - [phone]  After 6pm go to [email or web address]  - password [email or web address]  [facility]  Office  [phone]  CC: Primary care physician; Saralyn Pilar, DO  Note: This dictation was prepared with Dragon dictation along with smaller phrase technology. Any transcriptional errors that result from this process are unintentional.

## 2016-07-05 LAB — BASIC METABOLIC PANEL
ANION GAP: 7 (ref 5–15)
BUN: 26 mg/dL — ABNORMAL HIGH (ref 6–20)
CHLORIDE: 102 mmol/L (ref 101–111)
CO2: 27 mmol/L (ref 22–32)
Calcium: 9.2 mg/dL (ref 8.9–10.3)
Creatinine, Ser: 1.32 mg/dL — ABNORMAL HIGH (ref 0.61–1.24)
GFR calc non Af Amer: >60 mL/min (ref >60)
GLUCOSE: 114 mg/dL — AB (ref 65–99)
Potassium: 4.1 mmol/L (ref 3.5–5.1)
Sodium: 136 mmol/L (ref 135–145)

## 2016-07-05 LAB — GLUCOSE, CAPILLARY: Glucose-Capillary: 149 mg/dL — ABNORMAL HIGH (ref 65–99)

## 2016-07-05 MED ORDER — LOSARTAN POTASSIUM 25 MG PO TABS: 25.0000 mg | ORAL_TABLET | Freq: Every day | ORAL | 0 refills | Status: DC

## 2016-07-05 MED ORDER — TAMSULOSIN HCL 0.4 MG PO CAPS: 0.4000 mg | ORAL_CAPSULE | Freq: Every day | ORAL | 0 refills | Status: DC

## 2016-07-05 MED ORDER — MIRTAZAPINE 7.5 MG PO TABS: 7.5000 mg | ORAL_TABLET | Freq: Every evening | ORAL | 0 refills | Status: DC | PRN

## 2016-07-05 MED ORDER — HYDROCODONE-ACETAMINOPHEN 5-325 MG PO TABS: 1.0000 | ORAL_TABLET | Freq: Four times a day (QID) | ORAL | 0 refills | Status: DC | PRN

## 2016-07-05 NOTE — Evaluation (Signed)
Physical Therapy Evaluation Patient Details Name: Brett Huber MRN: [id number] DOB: 1966-03-17 Today's Date: 07/05/2016   History of Present Illness  Pt admitted to hospital with acute/chronic renal failiure. Per hosptiatlist he has a history of; Insomnia, HTN, frequent falls 2 days before admittance, as well as chronic neck and back pain.   Clinical Impression  Pt was understanding and willing to participate in a PT evaluation. He was able to perform bed mobility, transfers, and ambulation (444ft) under PT supervision and without any assistive device. Pt's overall strength and ROM are WFL. During ambulation the patient complained of slight pain in his R great toe, which he attributes to his falls prior to hospital admission. Pt ambulated with a normal gait pattern and speed and was able to independently ascend and descend 4 steps. Patient appears to be ambulating at baseline levels and does not need further PT services at this time.    Follow Up Recommendations No PT follow up    Equipment Recommendations  None recommended by PT    Recommendations for Other Services       Precautions / Restrictions Precautions Precautions: None Restrictions Weight Bearing Restrictions: No      Mobility  Bed Mobility Overal bed mobility: Independent                Transfers Overall transfer level: Independent                  Ambulation/Gait Ambulation/Gait assistance: Supervision Ambulation Distance (Feet): 400 Feet Assistive device: None Gait Pattern/deviations: WFL(Within Functional Limits)   Gait velocity interpretation: at or above normal speed for age/gender General Gait Details: Patient able to ambulate independently, was able to perform head turns, and safely change gait speed  Stairs Stairs: Yes Stairs assistance: Independent;Supervision Stair Management: One rail Right Number of Stairs: 4 General stair comments: Patient able to safely ascend and descend  stairs without any noticeable deviations  Wheelchair Mobility    Modified Rankin (Stroke Patients Only)       Balance Overall balance assessment: Independent                                           Pertinent Vitals/Pain      Home Living Family/patient expects to be discharged to:: Private residence Living Arrangements: Spouse/significant other;Children (Spouse and 2 kids) Available Help at Discharge: Family Type of Home: Apartment Home Access: Stairs to enter Entrance Stairs-Rails: Doctor, general practice of Steps: 20 Home Layout: One level Home Equipment: None      Prior Function Level of Independence: Independent               Hand Dominance        Extremity/Trunk Assessment   Upper Extremity Assessment Upper Extremity Assessment: Overall WFL for tasks assessed    Lower Extremity Assessment Lower Extremity Assessment: Overall WFL for tasks assessed       Communication   Communication: No difficulties  Cognition Arousal/Alertness: Awake/alert Behavior During Therapy: WFL for tasks assessed/performed Overall Cognitive Status: Within Functional Limits for tasks assessed                      General Comments      Exercises     Assessment/Plan    PT Assessment Patent does not need any further PT services  PT Problem List  PT Treatment Interventions      PT Goals (Current goals can be found in the Care Plan section)  Acute Rehab PT Goals Patient Stated Goal: Return Home PT Goal Formulation: All assessment and education complete, DC therapy    Frequency     Barriers to discharge        Co-evaluation               End of Session Equipment Utilized During Treatment: Gait belt Activity Tolerance: Patient tolerated treatment well Patient left: in bed;with call bell/phone within reach           Time: 1345-1403 PT Time Calculation (min) (ACUTE ONLY): 18 min   Charges:          PT G Codes:        [employer] Student PT  07/05/2016, 2:19 PM

## 2016-07-05 NOTE — Progress Notes (Signed)
IV was removed. Discharge instructions, follow-up appointments, and prescriptions were provided to the pt and wife at bedside. All questions answered. The pt was taken downstairs via wheelchair by volunteer services.

## 2016-07-05 NOTE — Discharge Summary (Signed)
[facility] [place] [facility]   PATIENT NAME: Brett Huber    MR#:  [id number]  DATE OF BIRTH:  May 03, 1966  DATE OF ADMISSION:  07/02/2016 ADMITTING PHYSICIAN: Katha Hamming, MD  DATE OF DISCHARGE: 07/05/2016  PRIMARY CARE PHYSICIAN: Saralyn Pilar, DO    ADMISSION DIAGNOSIS:  Uremic encephalopathy [G93.41, N19] Acute renal failure superimposed on chronic kidney disease, unspecified CKD stage, unspecified acute renal failure type (HCC) [N17.9, N18.9]  DISCHARGE DIAGNOSIS:  Active Problems:   Acute on chronic renal failure (HCC)   Altered mental status SECONDARY DIAGNOSIS:   Past Medical History:  Diagnosis Date  . Anxiety   . Chronic pain   . DDD (degenerative disc disease)   . Hyperlipidemia   . Hypertension   . Insomnia   . Sinus congestion     HOSPITAL COURSE:   * acute on chronic renal failure likely due to ATN with poor by mouth intake, nausea, does have hypotension here blood pressure is borderline  continue IV hydration, monitor urine output, patient had urine retention bladder scan showing 500 mL PVRof urine. Continue Foley catheter for monitoring. Output, continue monitoring kidney function, nephrology is consulted, seen and discussed with patient's wife.  US renal is without obstruction.  significant improvement in renal func.  appreciated nephrology help.  * chronic neck pain - avoid high-dose narcotics.  * insomnia patient amitriptyline and Neurontin were discontinued this time because of worsening renal function, jerks. Patient may be having myoclonic jerkson and off.   No more jerks now,.   Appreciated psych help, no amitryptiline, start remeron.  * Hypoxia, elevated white count: Likely secondary to bibasilar atelectasis/pneumonia:   on empiric antibiotics.will give for total 5 days. * multiple falls secondary to renal failure, dehydration, confusion secondary to multiple medications including  Neurontin and amitriptyline, narcotics.  Stable now, PT eval done.  * Altered mental status and hallucinations   Unexplained ? CT is negative and negative MRI head.   TSH was normal last month, check Vit B1 and folic acid level.   Neuro and Psych consult.    NO fever or neck rigidity.   Now improving with improving renal func.   Completely alert and oriented now.  DISCHARGE CONDITIONS:   Stable/  CONSULTS OBTAINED:  Treatment Team:  Mosetta Pigeon, MD Pauletta Browns, MD Beverly Sessions, MD  DRUG ALLERGIES:   Allergies  Allergen Reactions  . Bee Venom Shortness Of Breath    DISCHARGE MEDICATIONS:   Current Discharge Medication List    START taking these medications   Details  HYDROcodone-acetaminophen (NORCO/VICODIN) 5-325 MG tablet Take 1 tablet by mouth every 6 (six) hours as needed for moderate pain. Qty: 20 tablet, Refills: 0    losartan (COZAAR) 25 MG tablet Take 1 tablet (25 mg total) by mouth daily. Qty: 30 tablet, Refills: 0    mirtazapine (REMERON) 7.5 MG tablet Take 1 tablet (7.5 mg total) by mouth at bedtime as needed (anxiety/ sleep). Qty: 20 tablet, Refills: 0    tamsulosin (FLOMAX) 0.4 MG CAPS capsule Take 1 capsule (0.4 mg total) by mouth daily. Qty: 30 capsule, Refills: 0      CONTINUE these medications which have NOT CHANGED   Details  gabapentin (NEURONTIN) 300 MG capsule Take 300 mg by mouth 3 (three) times daily.    hydrOXYzine (VISTARIL) 25 MG capsule Take 1-2 capsules (25-50 mg total) by mouth at bedtime as needed (sleep). Qty: 60 capsule, Refills: 3   Associated Diagnoses: Primary insomnia  oxyCODONE (ROXICODONE) 15 MG immediate release tablet Take 15 mg by mouth 4 (four) times daily as needed for pain.    sildenafil (REVATIO) 20 MG tablet Take 3-5 pills as needed 30 minute prior to sex. Qty: 30 tablet, Refills: 5   Associated Diagnoses: Erectile dysfunction, unspecified erectile dysfunction type    Cholecalciferol (VITAMIN D3)  5000 units CAPS Take 1 capsule (5,000 Units total) by mouth daily. For 12 weeks, then start Vitamin D3 2,000 units daily (OTC) Qty: 90 capsule, Refills: 0   Associated Diagnoses: Vitamin D deficiency    fluticasone (FLONASE) 50 MCG/ACT nasal spray Place 2 sprays into both nostrils daily. Use for 4-6 weeks then stop and use seasonally or as needed. Qty: 16 g, Refills: 1   Associated Diagnoses: Acute seasonal allergic rhinitis due to other allergen      STOP taking these medications     amitriptyline (ELAVIL) 50 MG tablet      amLODipine (NORVASC) 5 MG tablet      olmesartan-hydrochlorothiazide (BENICAR HCT) 20-12.5 MG tablet      oxyCODONE-acetaminophen (PERCOCET) 10-325 MG tablet          DISCHARGE INSTRUCTIONS:    Follow with PMD to check renal function in 1-2 weeks.  If you experience worsening of your admission symptoms, develop shortness of breath, life threatening emergency, suicidal or homicidal thoughts you must seek medical attention immediately by calling 911 or calling your MD immediately  if symptoms less severe.  You Must read complete instructions/literature along with all the possible adverse reactions/side effects for all the Medicines you take and that have been prescribed to you. Take any new Medicines after you have completely understood and accept all the possible adverse reactions/side effects.   Please note  You were cared for by a hospitalist during your hospital stay. If you have any questions about your discharge medications or the care you received while you were in the hospital after you are discharged, you can call the unit and asked to speak with the hospitalist on call if the hospitalist that took care of you is not available. Once you are discharged, your primary care physician will handle any further medical issues. Please note that NO REFILLS for any discharge medications will be authorized once you are discharged, as it is imperative that you return  to your primary care physician (or establish a relationship with a primary care physician if you do not have one) for your aftercare needs so that they can reassess your need for medications and monitor your lab values.    Today   CHIEF COMPLAINT:   Chief Complaint  Patient presents with  . Altered Mental Status  . Flank Pain    HISTORY OF PRESENT ILLNESS:  Lonnel Gjerde  is a 51 y.o. male with a known history of Hypertension, chronic neck pain on narcotics, insomnia comes in because of confusion for last 2 days associated with nausea, vomiting, decreased urine output. Patient to wife says that he didn't void at all since morning. Also complains of back pain, flank pain for last 2 days. Patient also noted to have some jerks of hands. Multiple falls yesterday, today. Patient takes oxycodone 15 mg every 6 hours for chronic neck pain because of neck surgery. Follows up with pain management specialist syndrome. Also patient takes Neurontin, amitriptyline for insomnia. Patient, wife denies taking any extra pills. Noted to have creatinine of 8, patient creatinine a month ago is 3. Patient wanted around 300 mL of urine in  the emergency room, bladder scan showed 500 mL of urine. Patient has no hyperkalemia. White count 13, hemoglobin 12.3.   VITAL SIGNS:  Blood pressure (!) 137/95, pulse (!) 105, temperature 97.8 F (36.6 C), temperature source Oral, resp. rate 20, height 5\' 9"  (1.753 m), weight 81.9 kg (180 lb 8 oz), SpO2 97 %.  I/O:   Intake/Output Summary (Last 24 hours) at 07/05/16 1504 Last data filed at 07/05/16 1211  Gross per 24 hour  Intake             1071 ml  Output              875 ml  Net              196 ml    PHYSICAL EXAMINATION:  GENERAL:  51 y.o.-year-old patient lying in the bed with no acute distress.  EYES: Pupils equal, round, reactive to light and accommodation. No scleral icterus. Extraocular muscles intact.  HEENT: Head atraumatic, normocephalic. Oropharynx and  nasopharynx clear.  NECK:  Supple, no jugular venous distention. No thyroid enlargement, no tenderness.  LUNGS: Normal breath sounds bilaterally, no wheezing, rales,rhonchi or crepitation. No use of accessory muscles of respiration.  CARDIOVASCULAR: S1, S2 normal. No murmurs, rubs, or gallops.  ABDOMEN: Soft, non-tender, non-distended. Bowel sounds present. No organomegaly or mass.  EXTREMITIES: No pedal edema, cyanosis, or clubbing.  NEUROLOGIC: Cranial nerves II through XII are intact. Muscle strength 5/5 in all extremities. Sensation intact. Gait not checked.  PSYCHIATRIC: The patient is alert and oriented x 3.  SKIN: No obvious rash, lesion, or ulcer.   DATA REVIEW:   CBC  Recent Labs Lab 07/04/16 0624  WBC 8.6  HGB 13.4  HCT 37.7*  PLT 274    Chemistries   Recent Labs Lab 07/04/16 0624 07/05/16 0447  NA 137 136  K 4.0 4.1  CL 99* 102  CO2 28 27  GLUCOSE 129* 114*  BUN 33* 26*  CREATININE 1.58* 1.32*  CALCIUM 9.2 9.2  AST 86*  --   ALT 95*  --   ALKPHOS 87  --   BILITOT 1.2  --     Cardiac Enzymes  Recent Labs Lab 07/03/16 0508  TROPONINI 0.07*    Microbiology Results  No results found for this or any previous visit.  RADIOLOGY:  Ct Head Wo Contrast  Result Date: 07/03/2016 CLINICAL DATA:  Pt very confused while in CT. Per note in pt chart "Patient known to our practice from the patient. He was recently evaluated by Dr. Cherylann RatelLateef for acute renal failure. Serologies were all negative. Urine protein/creatinine ratio.*comment was truncated* EXAM: CT HEAD WITHOUT CONTRAST TECHNIQUE: Contiguous axial images were obtained from the base of the skull through the vertex without intravenous contrast. COMPARISON:  Head CT 11/04/2004 FINDINGS: Brain: No acute intracranial hemorrhage. No focal mass lesion. No CT evidence of acute infarction. No midline shift or mass effect. No hydrocephalus. Basilar cisterns are patent. Vascular: No hyperdense vessel or unexpected  calcification. Skull: Normal. Negative for fracture or focal lesion. Sinuses/Orbits: Paranasal sinuses and mastoid air cells are clear. Orbits are clear. Other: None. IMPRESSION: No acute intracranial findings.  Normal head CT for age. Electronically Signed   By: Genevive BiStewart  Edmunds M.D.   On: 07/03/2016 17:23   Mr Brain Wo Contrast  Result Date: 07/04/2016 CLINICAL DATA:  Increasing confusion.  Vomiting. EXAM: MRI HEAD WITHOUT CONTRAST TECHNIQUE: Multiplanar, multiecho pulse sequences of the brain and surrounding structures were obtained without intravenous contrast. COMPARISON:  Head CT from yesterday FINDINGS: Brain: No acute infarction, hemorrhage, hydrocephalus, extra-axial collection or mass lesion. Few FLAIR hyperintensities in the cerebral white matter attributed to chronic microvascular insults. These are commonly encounter by the patient's age. Normal brain volume. Vascular: Normal flow voids Skull and upper cervical spine: Negative Sinuses/Orbits: Adenoid thickening symmetrically. Sinusitis, most notably mucosal thickening with fluid retained in the right maxillary antrum. IMPRESSION: 1. Negative intracranial imaging.  No explanation for symptoms. 2. Right maxillary sinusitis. Electronically Signed   By: Marnee Spring M.D.   On: 07/04/2016 10:51    EKG:   Orders placed or performed during the hospital encounter of 07/02/16  . EKG 12-Lead  . EKG 12-Lead  . EKG 12-Lead  . EKG 12-Lead      Management plans discussed with the patient, family and they are in agreement.  CODE STATUS:     Code Status Orders        Start     Ordered   07/02/16 1811  Full code  Continuous     07/02/16 1811    Code Status History    Date Active Date Inactive Code Status Order ID Comments User Context   This patient has a current code status but no historical code status.      TOTAL TIME TAKING CARE OF THIS PATIENT: 35 minutes.    Altamese Dilling M.D on 07/05/2016 at 3:04 PM  Between 7am  to 6pm - Pager - [phone]  After 6pm go to [email or web address] - password [email or web address]  [facility]  Office  [phone]  CC: Primary care physician; Saralyn Pilar, DO   Note: This dictation was prepared with Dragon dictation along with smaller phrase technology. Any transcriptional errors that result from this process are unintentional.

## 2016-07-05 NOTE — Discharge Instructions (Signed)
Follow with PMD to check renal function in 1-2 weeks.

## 2016-07-05 NOTE — Progress Notes (Signed)
Subjective:  Patient appears to be close to his baseline mental status now. Creatinine currently down to 1.3. Patient is awake, alert, and following commands.  Objective:  Vital signs in last 24 hours:  Temp:  [97.8 F (36.6 C)-98.4 F (36.9 C)] 97.8 F (36.6 C) (01/08 0840) Pulse Rate:  [101-113] 105 (01/08 0840) Resp:  [19-22] 20 (01/08 0840) BP: (132-147)/(88-109) 137/95 (01/08 0840) SpO2:  [97 %-100 %] 97 % (01/08 0840) Weight:  [81.9 kg (180 lb 8 oz)] 81.9 kg (180 lb 8 oz) (01/08 0616)  Weight change: -0.045 kg (-1.6 oz) Filed Weights   07/03/16 0500 07/04/16 0449 07/05/16 0616  Weight: 82.6 kg (182 lb 1.6 oz) 81.9 kg (180 lb 9.6 oz) 81.9 kg (180 lb 8 oz)    Intake/Output:    Intake/Output Summary (Last 24 hours) at 07/05/16 1521 Last data filed at 07/05/16 1211  Gross per 24 hour  Intake             1071 ml  Output              875 ml  Net              196 ml     Physical Exam: General: No acute distress, sitting in the bed  HEENT Anicteric, moist oral mucous membranes  Neck supple  Pulm/lungs Normal breathing effort, clear to auscultation  CVS/Heart No rub  Abdomen:  Soft , nontender  Extremities: No edema  Neurologic: Awake, alert, following commands   Skin: No acute rashes          Basic Metabolic Panel:   Recent Labs Lab 07/02/16 1629 07/03/16 0508 07/04/16 0624 07/05/16 0447  NA 134* 136 137 136  K 4.2 4.1 4.0 4.1  CL 97* 100* 99* 102  CO2 23 24 28 27   GLUCOSE 103* 79 129* 114*  BUN 66* 57* 33* 26*  CREATININE 8.00* 4.45* 1.58* 1.32*  CALCIUM 8.4* 8.7* 9.2 9.2     CBC:  Recent Labs Lab 07/02/16 1629 07/03/16 0508 07/04/16 0624  WBC 13.5* 12.2* 8.6  NEUTROABS 10.1*  --   --   HGB 12.3* 12.3* 13.4  HCT 35.7* 35.8* 37.7*  MCV 83.5 83.2 81.5  PLT 277 254 274      Microbiology:  No results found for this or any previous visit (from the past 720 hour(s)).  Coagulation Studies: No results for input(s): LABPROT, INR in  the last 72 hours.  Urinalysis:  Recent Labs  07/02/16 1645  COLORURINE YELLOW*  LABSPEC 1.017  PHURINE 5.0  GLUCOSEU 50*  HGBUR MODERATE*  BILIRUBINUR NEGATIVE  KETONESUR NEGATIVE  PROTEINUR 100*  NITRITE NEGATIVE  LEUKOCYTESUR NEGATIVE      Imaging: Ct Head Huber Contrast  Result Date: 07/03/2016 CLINICAL DATA:  Pt very confused while in CT. Per note in pt chart "Patient known to our practice from the patient. He was recently evaluated by Dr. Cherylann Ratel for acute renal failure. Serologies were all negative. Urine protein/creatinine ratio.*comment was truncated* EXAM: CT HEAD WITHOUT CONTRAST TECHNIQUE: Contiguous axial images were obtained from the base of the skull through the vertex without intravenous contrast. COMPARISON:  Head CT 11/04/2004 FINDINGS: Brain: No acute intracranial hemorrhage. No focal mass lesion. No CT evidence of acute infarction. No midline shift or mass effect. No hydrocephalus. Basilar cisterns are patent. Vascular: No hyperdense vessel or unexpected calcification. Skull: Normal. Negative for fracture or focal lesion. Sinuses/Orbits: Paranasal sinuses and mastoid air cells are clear. Orbits are clear. Other:  None. IMPRESSION: No acute intracranial findings.  Normal head CT for age. Electronically Signed   By: Genevive BiStewart  Edmunds M.D.   On: 07/03/2016 17:23   Brett Huber Contrast  Result Date: 07/04/2016 CLINICAL DATA:  Increasing confusion.  Vomiting. EXAM: MRI HEAD WITHOUT CONTRAST TECHNIQUE: Multiplanar, multiecho pulse sequences of the brain and surrounding structures were obtained without intravenous contrast. COMPARISON:  Head CT from yesterday FINDINGS: Brain: No acute infarction, hemorrhage, hydrocephalus, extra-axial collection or mass lesion. Few FLAIR hyperintensities in the cerebral white matter attributed to chronic microvascular insults. These are commonly encounter by the patient's age. Normal brain volume. Vascular: Normal flow voids Skull and upper cervical  spine: Negative Sinuses/Orbits: Adenoid thickening symmetrically. Sinusitis, most notably mucosal thickening with fluid retained in the right maxillary antrum. IMPRESSION: 1. Negative intracranial imaging.  No explanation for symptoms. 2. Right maxillary sinusitis. Electronically Signed   By: Marnee SpringJonathon  Watts M.D.   On: 07/04/2016 10:51     Medications:   . sodium chloride 50 mL/hr at 07/05/16 0104   . aspirin  81 mg Oral Daily  . cefTRIAXone  1 g Intravenous Q24H  . docusate sodium  100 mg Oral BID  . fluticasone  2 spray Each Nare Daily  . heparin  5,000 Units Subcutaneous Q8H  . Influenza vac split quadrivalent PF  0.5 mL Intramuscular Tomorrow-1000  . tamsulosin  0.4 mg Oral Daily   acetaminophen **OR** acetaminophen, bisacodyl, haloperidol, HYDROcodone-acetaminophen, hydrOXYzine, mirtazapine, nitroGLYCERIN, ondansetron **OR** ondansetron (ZOFRAN) IV  Assessment/ Plan:  51 y.o. male  with hypertension, chronic back pain, insomnia, presented for confusion, nausea, vomiting, decreased output and is found to have acute renal failure  1.  Acute renal failure with urinary retention. Urinary retention may be due to multiple medications he is taking at home including amitriptyline, gabapentin, hydroxyzine, narcotics Foley catheter was previously placed. -  Renal function has dramatically improved.  Creatinine down to 1.32.  Continue IV fluid hydration for now.  We again counseled the patient to no longer take any NSAIDs.  2. Altered mental status Probably due to toxicity of psychoactive medications that developed due to renal failure -  Mental status significantly improved as compared to what was described earlier in the admission.  He is currently awake, alert, and following commands.    LOS: 3 Brett Huber 1/8/20183:21 PM

## 2016-07-06 NOTE — Progress Notes (Signed)
[facility]         [place], [place]   07/06/2016  Patient: Brett Huber   Date of Birth:  02/25/1966  Date of admission:  07/02/2016  Date of Discharge  07/06/2016    To Whom it May Concern:   Brett Huber  may return to work on 07/07/16.  PHYSICAL ACTIVITY:  Full  If you have any questions or concerns, please don't hesitate to call.  Sincerely,   Altamese DillingVACHHANI, Karl Erway M.D Pager [phone] Office : [phone]   .

## 2016-07-06 NOTE — Progress Notes (Signed)
[facility]         [place], [place]   07/06/2016  Patient: Brett Huber   Date of Birth:  12/21/1965  Date of admission:  07/02/2016  Date of Discharge  07/06/2016    To Whom it May Concern:   Brett Huber  may return to work on 07/06/16.  PHYSICAL ACTIVITY:  Full  If you have any questions or concerns, please don't hesitate to call.  Sincerely,   Altamese DillingVACHHANI, Dyllen Menning M.D Pager [phone]- [phone] Office : [phone]   .

## 2016-07-07 LAB — VITAMIN B1: VITAMIN B1 (THIAMINE): 144.2 nmol/L (ref 66.5–200.0)

## 2016-07-09 ENCOUNTER — Other Ambulatory Visit: Payer: Self-pay | Admitting: *Deleted

## 2016-07-09 NOTE — Patient Outreach (Signed)
[facility] Care Management  07/09/2016  Brett Huber 04/09/1966 [id number]   Subjective: Telephone call to patient's home number, no answer, left HIPAA compliant voicemail message, and requested call back.   Objective: Per chart and [employer] i[employer]aborate review, patient hospitalized 07/02/16 -07/05/16 for acute on chronic renal failure.   Patient also has a history of hypertension and hyperlipidemia.   Assessment: Received [employer] Transition of care referral on  07/07/16.   Transition of care follow up pending patient contact.   Plan: RNCM will call patient for 2nd telephone outreach attempt, transition of care follow up, within 10 business days, if no return call from patient.   Ghada Abbett H. Gardiner Barefootooper RN, BSN, CCM [facility] Care Management [facility] Telephonic CM Phone: [phone] Fax: [phone]

## 2016-07-12 ENCOUNTER — Ambulatory Visit: Payer: Self-pay | Admitting: *Deleted

## 2016-07-12 ENCOUNTER — Other Ambulatory Visit: Payer: Self-pay | Admitting: *Deleted

## 2016-07-12 NOTE — Patient Outreach (Addendum)
[facility] Care Management  07/12/2016  Jettie PaganBradley T Stamour 11/14/1965 [id number]   Subjective: Telephone call to patient's home number, no answer, left HIPAA compliant voicemail message, and requested call back.   Objective: Per chart and [employer] iCollaborate review, patient hospitalized 07/02/16 -07/05/16 for acute on chronic renal failure.   Patient also has a history of hypertension and hyperlipidemia.   Assessment: Received [employer] Transition of care referral on  07/07/16.   Transition of care follow up pending patient contact.   Plan: RNCM will call patient for 3rd telephone outreach attempt, transition of care follow up, within 10 business days, if no return call from patient.   Gissela Bloch H. Gardiner Barefootooper RN, BSN, CCM [facility] Care Management [facility] Telephonic CM Phone: [phone] Fax: [phone]

## 2016-07-13 ENCOUNTER — Inpatient Hospital Stay: Payer: Managed Care, Other (non HMO) | Admitting: Family Medicine

## 2016-07-14 ENCOUNTER — Other Ambulatory Visit: Payer: Self-pay | Admitting: *Deleted

## 2016-07-14 ENCOUNTER — Ambulatory Visit: Payer: Self-pay | Admitting: *Deleted

## 2016-07-14 ENCOUNTER — Encounter: Payer: Self-pay | Admitting: *Deleted

## 2016-07-14 NOTE — Patient Outreach (Addendum)
[facility] Care Management  07/14/2016  Brett Huber 09/12/1965 [id number]  Subjective: Telephone call to patient's home number #1, no answer, left HIPAA compliant voicemail message, and requested call back. Telephone call to patient's mobile number, no answer, left HIPAA compliant voicemail message, and requested call back.   Objective: Per chart and [employer] iCollaborate review, patient hospitalized 07/02/16 -07/05/16 for acute on chronic renal failure. Patient also has a history of hypertension and hyperlipidemia.   Assessment: Received [employer] Transition of care referral on 07/07/16. Transition of care follow up pending patient contact.   Plan: RNCM will send patient unsuccessful outreach letter, [facility] pamph[facility], and proceed with case closure within 10 business days, if no return call from patient.    Aalyiah Camberos H. Gardiner Barefootooper RN, BSN, CCM [facility] Care Management [facility] Telephonic CM Phone: [phone] Fax: [phone]

## 2016-07-19 ENCOUNTER — Encounter: Payer: Self-pay | Admitting: Family Medicine

## 2016-07-19 ENCOUNTER — Ambulatory Visit (INDEPENDENT_AMBULATORY_CARE_PROVIDER_SITE_OTHER): Payer: Managed Care, Other (non HMO) | Admitting: Family Medicine

## 2016-07-19 VITALS — BP 159/105 | HR 94 | Temp 98.2°F | Resp 16 | Ht 69.0 in | Wt 183.0 lb

## 2016-07-19 DIAGNOSIS — H9192 Unspecified hearing loss, left ear: Secondary | ICD-10-CM | POA: Diagnosis not present

## 2016-07-19 DIAGNOSIS — G9341 Metabolic encephalopathy: Secondary | ICD-10-CM

## 2016-07-19 DIAGNOSIS — N183 Chronic kidney disease, stage 3 (moderate): Secondary | ICD-10-CM | POA: Diagnosis not present

## 2016-07-19 DIAGNOSIS — N179 Acute kidney failure, unspecified: Secondary | ICD-10-CM | POA: Diagnosis not present

## 2016-07-19 DIAGNOSIS — F5101 Primary insomnia: Secondary | ICD-10-CM | POA: Diagnosis not present

## 2016-07-19 DIAGNOSIS — I10 Essential (primary) hypertension: Secondary | ICD-10-CM

## 2016-07-19 MED ORDER — TRAZODONE HCL 100 MG PO TABS: 100.0000 mg | ORAL_TABLET | Freq: Every day | ORAL | 2 refills | Status: DC

## 2016-07-19 MED ORDER — OLMESARTAN MEDOXOMIL-HCTZ 20-12.5 MG PO TABS: 1.0000 | ORAL_TABLET | Freq: Every day | ORAL | 11 refills | Status: DC

## 2016-07-19 MED ORDER — AMLODIPINE BESYLATE 5 MG PO TABS: 5.0000 mg | ORAL_TABLET | Freq: Every day | ORAL | 11 refills | Status: DC

## 2016-07-19 MED ORDER — ALPRAZOLAM 1 MG PO TABS: 1.0000 mg | ORAL_TABLET | Freq: Every evening | ORAL | 1 refills | Status: DC | PRN

## 2016-07-19 NOTE — Assessment & Plan Note (Signed)
Reassurance given gradual improvement in hearing currently, likely with acute renal failure illness, less likely secondary to TCA. No other ototoxic meds, only received cephalosporin antibiotics.  Plan: 1. Monitor for continued improvement over next few weeks, if still no improvement in 6 more weeks then consider ENT referral and audiology testing

## 2016-07-19 NOTE — Patient Instructions (Signed)
Thank you for coming in to clinic today.  1. For blood pressure - Stop new med Losartan - Resume previous 2 meds - Amlodpine 5mg  daily, Benicar 20-12.5mg  daily (these were sent back to pharmacy, with refills) - Keep checking BP, recording BP as planned bring to next visit  2. For Insomnia - Stop Remeron - Start Xanax 1mg  nightly for sleep along with Trazodone - given 100mg  tabs you can either start with half tab 50mg  nightly for several days 3-5 days or start with 100mg  for up to 2 weeks, then if needed can increase up to 150mg  (1.5 tabs) or drop down if needed - As discussed, we will work on future referral to Sleep Medicine if needed  3. Left Ear hearing loss - I am reassured, as most causes of hearing loss from acute illness or variety of causes will gradually improve over weeks to months - Let this be for now and we will see how you do - If still not improved to >75% or better by 6 weeks from now then we can work on referral to ENT for further hearing testing - As mentioned maybe aspirin contributed  Please schedule a follow-up appointment with Dr. Althea CharonKaramalegos in 6 weeks for follow-up insomnia and BP  * [facility] Cherylann RatelLateef to follow-up in about 3-4 weeks or sooner as planned from your hospital visit (they had stated about 4 weeks from December for follow-up)  If you have any other questions or concerns, please feel free to call the clinic or send a message through MyChart. You may also schedule an earlier appointment if necessary.  Saralyn [name] Karamalegos, DO [facility]

## 2016-07-19 NOTE — Assessment & Plan Note (Signed)
Resolved AKI, has improved back to baseline Cr on discharge approx 1.3, with peak during hospitalization up to Cr 8. Unclear exact etiology, suspected some component of hypotension perhaps with poor PO intake poor hydration and HTN medications, additionally could be related to TCA in this setting, and prior chronic BC powder / NSAID use.  Plan: 1. Resume anti-HTN therapy again with CCB 5mg  daily, Olmesartan-HCTZ 20-12.5mg  daily - long discussion on appropriate hydration and PO intake on this regimen with thiazide 2. Closely monitor BP outside office while resuming prior anti-HTN regimen 3. Advised if significant low BP, contact office follow-up, would likely stop the thiazide and continue ARB / CCB, given Cr had returned to baseline can continue ARB 4. Control HTN to avoid worsening CKD 5. Consider check BMET today, however patient to follow-up with Nephrology soon, patient opt to not repeat blood draw - patient to contact Nephrology to determine follow-up plan, may return for chemistry lab if needed

## 2016-07-19 NOTE — Assessment & Plan Note (Addendum)
Dramatic worsening off of prior TCA / hydroxyzine, now following hospitalization with AoCKD resolving, Cr back to baseline. 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 Long discussion today on limited options - agree to start temporary treatment with Xanax and Trazodone in combination, which as improved insomnia in past, until we can make further arrangements with sleep medicine vs psychiatry for further management. He understands that this is not long-term plan from our office. Also he is to notify is chronic pain doctor that he was started on this to anticipate BDZ in UDS - Start Xanax 1mg  tabs take 1 at night PRN insomnia #30, +1 refill - Start Trazodone 100mg  take 1 at night, consider dose titrate up to 150mg  within 2 weeks if needed, otherwise may drop down to 50, however he had been more stable in past on 100 2. Use above regimen to avoid use of Advil PM, Benadryl 3. Discontinue Mirtazapine 7.5mg  for now, advised we could have considered dose titration, however lack of sleep and recent acute illness, will hold on dose titration 4. Follow-up 6 weeks re-evaluate - will place referral at that time for sleep medicine most likely

## 2016-07-19 NOTE — Progress Notes (Addendum)
Subjective:    Patient ID: Brett Huber, male    DOB: 1966-06-17, 51 y.o.   MRN: [id number]  Brett Huber is a 51 y.o. male presenting on 07/19/2016 for Follow-up (HTN, insomnia pt's B/P machine avarages 155/104)   HPI   Hospital Follow-up, admitted 07/02/16, discharged 07/05/16  FOLLOW-UP Acute on Chronic Kidney Disease with suspected ATN vs NSAID induced nephropathy - Recently identified problem in 05/2016 with new diagnosis CKD and elevated Cr, thought to be due to chronic HTN also chronic NSAID use with BC powder, he was established with Nephrology ([facility] - Dr Cherylann Ratel) - He was hospitalized with acute encephalopathy and found to be in acute renal failure, he was followed by Renal in hospital as well, peak Cr up to 8, thought acute on chronic renal failure likely due to ATN with hypotension and some pre-renal with dehydration (poor PO and nausea). No new NSAID use to trigger. Cr improved and he was able to be discharged - Recent imaging with Renal US, without obstruction - He has close follow-up with Nephrology outpatient within few weeks, and will check with them on lab follow-up - Denies any problem with voiding, PO intake, abdominal or flank pain  Follow-up Left Ear Hearing Loss, Acute - Additionally, today patient reports problem that was present during hospitalization, recalls this as new problem after acute illness with persistent hearing loss in Left ear. Now seems to be gradually improving, and is currently about 40% improved, still has a "buzzing", states he takes an annual hearing test with work and knows he has normal hearing. He was not advised any clear cause of this in the hospital. - See additional course for all medications during hospitalization, of note some concern with the renal failure was related to chronic BC powder use containing aspirin. Also received antibiotics only appears to be Cephalosporin with Rocephin for possible PNA. Did not receive ototoxic  medications  CHRONIC HTN: Recent history with improved control of HTN, last seen by me 06/04/16 with normal BP and actually low end 100s/60s, during and after hospitalization his BP has been elevated. Initially due to acute illness also was concern for dehydration component with AoCKD and he had episode of hypotension by report, his anti-HTN regimen was held and adjusted, on discharge only started on new ARB Losartan 25, stopped HCTZ and CCB, has been having elevated BP at home checks since discharge 150/100s on average - Previously on Amlodipine 5mg , Olmesartan-HCTZ 20-12.5mg  daily    FOLLOW-UP INSOMNIA, Chronic: - Recent visits 05/25/16 and 06/04/16, see notes for background information. He was previously better controlled on Amitriptyline 50-75mg  nightly and Hydroxyzine 25-50mg  nightly PRN, these were discontinued in hospital, he was seen by Psychiatry, and started on Remeron 7.5mg  nightly for sleep. Discharged on this but states it is not helping. Significant concerns with his refractory insomnia for many years, now he has not slept more than 3 hours total in past several days. Extensive med failures in past with SSRI, Belsomra, Lunesta, has been on variety of regimens with Trazodone, Ambien, Xanax, most effective was trazodone and xanax - On discharge advised not to resume TCA. Previously taking Advil PM, Unisom, Melatonin OTC without improvement. Has hang-over effect on Ambien - Interested to try temporary medication change and then will be agreeable to sleep medicine referral, as he has explored these options in the past with sleep studies, sleep specialist and psychiatry and did not have great results, would be interested to try again locally, last psychiatry in [facility]  [place] >4 years ago - Denies any mania symptoms, heart racing, palpitations, chest pain, dyspnea, pain keeping him awake, tremors, nightmares, restless leg syndrome  Follow-up Acute Encephalopathy, suspected metabolic with  Uremia vs medication induced - RESOLVED - Review of hospitalization with initial presentation he had acute encephalopathy with hallucinations, thought to be due to acute renal failure with uremia and medications with TCA amitriptyline, both neurology and psychiatry were involved, he had Head CT and MRI Head that were negative. Additional labs unremarkable. Clinically improved with resolving renal failure. This problem was resolved at time of discharge.   Social History  Substance Use Topics  . Smoking status: Never Smoker  . Smokeless tobacco: Current User    Types: Chew  . Alcohol use Yes     Comment: occ    Review of Systems Per HPI unless specifically indicated above     Objective:    BP (!) 159/105 (BP Location: Left Arm, Patient Position: Sitting, Cuff Size: Normal)   Pulse 94   Temp 98.2 F (36.8 C) (Oral)   Resp 16   Ht 5\' 9"  (1.753 m)   Wt 183 lb (83 kg)   BMI 27.02 kg/m   Wt Readings from Last 3 Encounters:  07/19/16 183 lb (83 kg)  07/05/16 180 lb 8 oz (81.9 kg)  06/04/16 188 lb (85.3 kg)    Physical Exam  Constitutional: He is oriented to person, place, and time. He appears well-developed and well-nourished. No distress.  Tired but well-appearing, comfortable, cooperative  HENT:  Head: Normocephalic and atraumatic.  Mouth/Throat: Oropharynx is clear and moist.  Bilateral TMs clear without erythema, effusion or bulging.  Conversational hearing normal.  Oropharynx clear without erythema, exudates, edema or asymmetry.  Eyes: Conjunctivae are normal.  Cardiovascular: Normal rate, regular rhythm, normal heart sounds and intact distal pulses.   No murmur heard. Pulmonary/Chest: Effort normal.  Musculoskeletal: He exhibits no edema.  Neurological: He is alert and oriented to person, place, and time.  Skin: Skin is warm and dry. No rash noted. He is not diaphoretic.  Psychiatric: His behavior is normal.  Nursing note and vitals reviewed.  Results for orders  placed or performed during the hospital encounter of 07/02/16  Urinalysis, Complete w Microscopic  Result Value Ref Range   Color, Urine YELLOW (A) YELLOW   APPearance HAZY (A) CLEAR   Specific Gravity, Urine 1.017 1.005 - 1.030   pH 5.0 5.0 - 8.0   Glucose, UA 50 (A) NEGATIVE mg/dL   Hgb urine dipstick MODERATE (A) NEGATIVE   Bilirubin Urine NEGATIVE NEGATIVE   Ketones, ur NEGATIVE NEGATIVE mg/dL   Protein, ur 161100 (A) NEGATIVE mg/dL   Nitrite NEGATIVE NEGATIVE   Leukocytes, UA NEGATIVE NEGATIVE   RBC / HPF 0-5 0 - 5 RBC/hpf   WBC, UA 0-5 0 - 5 WBC/hpf   Bacteria, UA RARE (A) NONE SEEN   Squamous Epithelial / LPF 0-5 (A) NONE SEEN   Mucous PRESENT    Hyaline Casts, UA PRESENT    Amorphous Crystal PRESENT   CBC with Differential  Result Value Ref Range   WBC 13.5 (H) 3.8 - 10.6 K/uL   RBC 4.28 (L) 4.40 - 5.90 MIL/uL   Hemoglobin 12.3 (L) 13.0 - 18.0 g/dL   HCT 09.635.7 (L) 04.540.0 - 40.952.0 %   MCV 83.5 80.0 - 100.0 fL   MCH 28.8 26.0 - 34.0 pg   MCHC 34.5 32.0 - 36.0 g/dL   RDW 81.114.4 91.411.5 - 78.214.5 %  Platelets 277 150 - 440 K/uL   Neutrophils Relative % 75 %   Neutro Abs 10.1 (H) 1.4 - 6.5 K/uL   Lymphocytes Relative 10 %   Lymphs Abs 1.3 1.0 - 3.6 K/uL   Monocytes Relative 12 %   Monocytes Absolute 1.6 (H) 0.2 - 1.0 K/uL   Eosinophils Relative 3 %   Eosinophils Absolute 0.4 0 - 0.7 K/uL   Basophils Relative 0 %   Basophils Absolute 0.0 0 - 0.1 K/uL  Comprehensive metabolic panel  Result Value Ref Range   Sodium 134 (L) 135 - 145 mmol/L   Potassium 4.2 3.5 - 5.1 mmol/L   Chloride 97 (L) 101 - 111 mmol/L   CO2 23 22 - 32 mmol/L   Glucose, Bld 103 (H) 65 - 99 mg/dL   BUN 66 (H) 6 - 20 mg/dL   Creatinine, Ser 1.61 (H) 0.61 - 1.24 mg/dL   Calcium 8.4 (L) 8.9 - 10.3 mg/dL   Total Protein 7.3 6.5 - 8.1 g/dL   Albumin 4.2 3.5 - 5.0 g/dL   AST 26 15 - 41 U/L   ALT 26 17 - 63 U/L   Alkaline Phosphatase 45 38 - 126 U/L   Total Bilirubin 1.1 0.3 - 1.2 mg/dL   GFR calc non Af Amer 7  (L) >60 mL/min   GFR calc Af Amer 8 (L) >60 mL/min   Anion gap 14 5 - 15  Basic metabolic panel  Result Value Ref Range   Sodium 136 135 - 145 mmol/L   Potassium 4.1 3.5 - 5.1 mmol/L   Chloride 100 (L) 101 - 111 mmol/L   CO2 24 22 - 32 mmol/L   Glucose, Bld 79 65 - 99 mg/dL   BUN 57 (H) 6 - 20 mg/dL   Creatinine, Ser 0.96 (H) 0.61 - 1.24 mg/dL   Calcium 8.7 (L) 8.9 - 10.3 mg/dL   GFR calc non Af Amer 14 (L) >60 mL/min   GFR calc Af Amer 16 (L) >60 mL/min   Anion gap 12 5 - 15  CBC  Result Value Ref Range   WBC 12.2 (H) 3.8 - 10.6 K/uL   RBC 4.31 (L) 4.40 - 5.90 MIL/uL   Hemoglobin 12.3 (L) 13.0 - 18.0 g/dL   HCT 04.5 (L) 40.9 - 81.1 %   MCV 83.2 80.0 - 100.0 fL   MCH 28.6 26.0 - 34.0 pg   MCHC 34.4 32.0 - 36.0 g/dL   RDW 91.4 (H) 78.2 - 95.6 %   Platelets 254 150 - 440 K/uL  Protein / creatinine ratio, urine  Result Value Ref Range   Creatinine, Urine 242 mg/dL   Total Protein, Urine 146 mg/dL   Protein Creatinine Ratio 0.60 (H) 0.00 - 0.15 mg/mg[Cre]  ANA Comprehensive Panel  Result Value Ref Range   ds DNA Ab <1 0 - 9 IU/mL   Ribonucleic Protein 0.4 0.0 - 0.9 AI   ENA SM Ab Ser-aCnc <0.2 0.0 - 0.9 AI   Scleroderma (Scl-70) (ENA) Antibody, IgG <0.2 0.0 - 0.9 AI   SSA (Ro) (ENA) Antibody, IgG <0.2 0.0 - 0.9 AI   SSB (La) (ENA) Antibody, IgG <0.2 0.0 - 0.9 AI   Chromatin Ab SerPl-aCnc <0.2 0.0 - 0.9 AI   Anti JO-1 <0.2 0.0 - 0.9 AI   Centromere Ab Screen <0.2 0.0 - 0.9 AI   See below: Comment   Sedimentation rate  Result Value Ref Range   Sed Rate 46 (H) 0 - 20  mm/hr  Troponin I  Result Value Ref Range   Troponin I 0.07 (HH) <0.03 ng/mL  CBC  Result Value Ref Range   WBC 8.6 3.8 - 10.6 K/uL   RBC 4.63 4.40 - 5.90 MIL/uL   Hemoglobin 13.4 13.0 - 18.0 g/dL   HCT 16.1 (L) 09.6 - 04.5 %   MCV 81.5 80.0 - 100.0 fL   MCH 28.9 26.0 - 34.0 pg   MCHC 35.5 32.0 - 36.0 g/dL   RDW 40.9 81.1 - 91.4 %   Platelets 274 150 - 440 K/uL  Comprehensive metabolic panel  Result  Value Ref Range   Sodium 137 135 - 145 mmol/L   Potassium 4.0 3.5 - 5.1 mmol/L   Chloride 99 (L) 101 - 111 mmol/L   CO2 28 22 - 32 mmol/L   Glucose, Bld 129 (H) 65 - 99 mg/dL   BUN 33 (H) 6 - 20 mg/dL   Creatinine, Ser 7.82 (H) 0.61 - 1.24 mg/dL   Calcium 9.2 8.9 - 95.6 mg/dL   Total Protein 7.8 6.5 - 8.1 g/dL   Albumin 3.8 3.5 - 5.0 g/dL   AST 86 (H) 15 - 41 U/L   ALT 95 (H) 17 - 63 U/L   Alkaline Phosphatase 87 38 - 126 U/L   Total Bilirubin 1.2 0.3 - 1.2 mg/dL   GFR calc non Af Amer 49 (L) >60 mL/min   GFR calc Af Amer 56 (L) >60 mL/min   Anion gap 10 5 - 15  Folate  Result Value Ref Range   Folate 12.4 >5.9 ng/mL  Vitamin B1  Result Value Ref Range   Vitamin B1 (Thiamine) 144.2 66.5 - 200.0 nmol/L  Glucose, capillary  Result Value Ref Range   Glucose-Capillary 129 (H) 65 - 99 mg/dL   Comment 1 Notify RN   Basic metabolic panel  Result Value Ref Range   Sodium 136 135 - 145 mmol/L   Potassium 4.1 3.5 - 5.1 mmol/L   Chloride 102 101 - 111 mmol/L   CO2 27 22 - 32 mmol/L   Glucose, Bld 114 (H) 65 - 99 mg/dL   BUN 26 (H) 6 - 20 mg/dL   Creatinine, Ser 2.13 (H) 0.61 - 1.24 mg/dL   Calcium 9.2 8.9 - 08.6 mg/dL   GFR calc non Af Amer >60 >60 mL/min   GFR calc Af Amer >60 >60 mL/min   Anion gap 7 5 - 15  Glucose, capillary  Result Value Ref Range   Glucose-Capillary 149 (H) 65 - 99 mg/dL   Comment 1 Notify RN       Assessment & Plan:   Problem List Items Addressed This Visit    Insomnia    Dramatic worsening off of prior TCA / hydroxyzine, now following hospitalization with AoCKD resolving, Cr back to baseline. Not improved on Remeron 7.5mg  - Concern with very refractory primary insomnia, multiple med failures in past - Checked [place] CSRS for past 1 year from 07/19/16, only chronic pain management rx from his physician, appropriate intervals, also some older rx from prior PCP trial on Belsomra, no regular BDZ use or other red flags  Plan: 1. Long discussion today on  limited options - agree to start temporary treatment with Xanax and Trazodone in combination, which as improved insomnia in past, until we can make further arrangements with sleep medicine vs psychiatry for further management. He understands that this is not long-term plan from our office. Also he is to notify is chronic pain  doctor that he was started on this to anticipate BDZ in UDS - Start Xanax 1mg  tabs take 1 at night PRN insomnia #30, +1 refill - Start Trazodone 100mg  take 1 at night, consider dose titrate up to 150mg  within 2 weeks if needed, otherwise may drop down to 50, however he had been more stable in past on 100 2. Use above regimen to avoid use of Advil PM, Benadryl 3. Discontinue Mirtazapine 7.5mg  for now, advised we could have considered dose titration, however lack of sleep and recent acute illness, will hold on dose titration 4. Follow-up 6 weeks re-evaluate - will place referral at that time for sleep medicine most likely      Relevant Medications   ALPRAZolam (XANAX) 1 MG tablet   traZODone (DESYREL) 100 MG tablet   Hypertension    Elevated BP recently uncontrolled after acute illness with AoCKD with concern of hypotension then taken off anti-HTN regimen, especially in setting of poor PO intake prior to hospitalization, remained elevated after discharge >150/100s only on low dose ARB  Plan: 1. Resume anti-HTN therapy again with CCB 5mg  daily, Olmesartan-HCTZ 20-12.5mg  daily - long discussion on appropriate hydration and PO intake on this regimen with thiazide 2. Closely monitor BP outside office while resuming prior anti-HTN regimen 3. Advised if significant low BP, contact office follow-up, would likely stop the thiazide and continue ARB / CCB, given Cr had returned to baseline can continue ARB 4. Control HTN to avoid worsening CKD 5. Consider check BMET today, however patient to follow-up with Nephrology soon, patient opt to not repeat blood draw      Relevant Medications    olmesartan-hydrochlorothiazide (BENICAR HCT) 20-12.5 MG tablet   amLODipine (NORVASC) 5 MG tablet   Hearing loss of left ear    Reassurance given gradual improvement in hearing currently, likely with acute renal failure illness, less likely secondary to TCA. No other ototoxic meds, only received cephalosporin antibiotics.  Plan: 1. Monitor for continued improvement over next few weeks, if still no improvement in 6 more weeks then consider ENT referral and audiology testing      Acute on chronic renal failure (HCC) - Primary    Resolved AKI, has improved back to baseline Cr on discharge approx 1.3, with peak during hospitalization up to Cr 8. Unclear exact etiology, suspected some component of hypotension perhaps with poor PO intake poor hydration and HTN medications, additionally could be related to TCA in this setting, and prior chronic BC powder / NSAID use.  Plan: 1. Resume anti-HTN therapy again with CCB 5mg  daily, Olmesartan-HCTZ 20-12.5mg  daily - long discussion on appropriate hydration and PO intake on this regimen with thiazide 2. Closely monitor BP outside office while resuming prior anti-HTN regimen 3. Advised if significant low BP, contact office follow-up, would likely stop the thiazide and continue ARB / CCB, given Cr had returned to baseline can continue ARB 4. Control HTN to avoid worsening CKD 5. Consider check BMET today, however patient to follow-up with Nephrology soon, patient opt to not repeat blood draw - patient to contact Nephrology to determine follow-up plan, may return for chemistry lab if needed       Other Visit Diagnoses    Acute metabolic encephalopathy       Resolved, on discharge from hospital with normalized renal function, thought to be uremic      Meds ordered this encounter  Medications  . DISCONTD: amLODipine (NORVASC) 5 MG tablet  . DISCONTD: olmesartan-hydrochlorothiazide (BENICAR HCT) 20-12.5 MG tablet  .  olmesartan-hydrochlorothiazide  (BENICAR HCT) 20-12.5 MG tablet    Sig: Take 1 tablet by mouth daily.    Dispense:  30 tablet    Refill:  11  . amLODipine (NORVASC) 5 MG tablet    Sig: Take 1 tablet (5 mg total) by mouth daily.    Dispense:  30 tablet    Refill:  11  . ALPRAZolam (XANAX) 1 MG tablet    Sig: Take 1 tablet (1 mg total) by mouth at bedtime as needed for sleep.    Dispense:  30 tablet    Refill:  1  . traZODone (DESYREL) 100 MG tablet    Sig: Take 1 tablet (100 mg total) by mouth at bedtime.    Dispense:  30 tablet    Refill:  2      Follow up plan: Return in about 6 weeks (around 08/30/2016) for blood pressure, insomnia.  Saralyn Pilar, DO [facility] 07/20/2016, 7:04 AM

## 2016-07-19 NOTE — Assessment & Plan Note (Signed)
Elevated BP recently uncontrolled after acute illness with AoCKD with concern of hypotension then taken off anti-HTN regimen, especially in setting of poor PO intake prior to hospitalization, remained elevated after discharge >150/100s only on low dose ARB  Plan: 1. Resume anti-HTN therapy again with CCB 5mg  daily, Olmesartan-HCTZ 20-12.5mg  daily - long discussion on appropriate hydration and PO intake on this regimen with thiazide 2. Closely monitor BP outside office while resuming prior anti-HTN regimen 3. Advised if significant low BP, contact office follow-up, would likely stop the thiazide and continue ARB / CCB, given Cr had returned to baseline can continue ARB 4. Control HTN to avoid worsening CKD 5. Consider check BMET today, however patient to follow-up with Nephrology soon, patient opt to not repeat blood draw

## 2016-07-28 ENCOUNTER — Other Ambulatory Visit: Payer: Self-pay | Admitting: *Deleted

## 2016-07-28 NOTE — Patient Outreach (Signed)
[facility] Care Management  07/28/2016  Brett Huber 12/21/1965 [id number]   No response from patient outreach attempts, will proceed with case closure.  Objective: Per chart and [employer]abor[employer] review, patient hospitalized 07/02/16 -07/05/16 for acute on chronic renal failure. Patient also has a history of hypertension and hyperlipidemia.   Assessment: Received [employer] Transition of care referral on 07/07/16. Transition of care follow up not completed due to patient unable to contact and will proceed with case closure.  Plan: RNCM will send case closure due to unable to reach request to Iverson AlaminLaura Greeson at [facility].      Martino Tompson H. Gardiner Barefootooper RN, BSN, CCM [facility] Telephonic CM Phone: [phone] Fax: [phone]

## 2016-08-03 ENCOUNTER — Telehealth: Payer: Self-pay | Admitting: Family Medicine

## 2016-08-03 DIAGNOSIS — N179 Acute kidney failure, unspecified: Secondary | ICD-10-CM

## 2016-08-03 DIAGNOSIS — N183 Chronic kidney disease, stage 3 (moderate): Principal | ICD-10-CM

## 2016-08-03 NOTE — Telephone Encounter (Signed)
Update, 08/03/16 approx 1330, placed order for STAT BMET for patient to get done at [facility] today, and will notify patient of results. If significant elevated Creatinine then will contact patient and advise next step likely needs to go to ED if still symptomatic or worsening. Additionally, I am awaiting call back from [facility] to see if he can get in sooner than 08/09/16.  Patient offers resistance today when discussing this request with [facility] nursing staff, and if he cannot get lab drawn today he will go tomorrow morning to get lab drawn. He understands risk of kidney failure. Will notify him once stat lab is resulted to discuss plan.  Saralyn [name] Karamalegos, DO [facility] 08/03/2016, 1:34 PM

## 2016-08-03 NOTE — Telephone Encounter (Addendum)
I last saw patient 07/19/16 in hospital follow-up for Acute on Chronic Kidney Disease, see note for details. At time of hospitalization he had some uremia and metabolic encephalopathy. Since last visit his Cr had stabilized back to baseline 1.3, and he was resumed on HCTZ and ARB for BP control, also his meds were adjusted and he was no longer on TCA for insomnia, and last visit I gave him Alprazolam and Trazodone for insomnia.  Now concern with received phone call about worsening symptoms similar to prior hospitalization, with some confusion worse over past 2 days. Our staff contacted [facility] and they have the patient scheduled for next apt 08/09/16, and patient has scheduled follow-up sooner now with me tomorrow 08/04/16 for labs, for more urgent follow-up. Ideally we can check a stat BMET today at outside [facility], otherwise if we cannot reach patient we would check this tomorrow.  However my concern is if he has acute kidney injury now with worsening and signs of uremia this is an emergency and he was already advised to go to ED however he declined due to cost and needing to work still. I attempted to contact patient but unable to reach him today 08/03/16, and attempted to call [facility] but unable to reach their office during lunch hour, left message for call back to try to speak with triage and get patient in sooner if possible or get further advice.  Saralyn [name] Annesha Delgreco, DO [facility] 08/03/2016, 12:46 PM

## 2016-08-03 NOTE — Telephone Encounter (Signed)
Pt states his Sx coming back has pain getting worst from past 2 days which was improved in past as per wife he is confused. His appointment scheduled with nephrologist on 02/12 at 12:00 pm. Pt has appointment tomorrow for blood work was concerned that if he gets any additional blood work or can be seen by Dr. Althea CharonKaramalegos.

## 2016-08-04 ENCOUNTER — Other Ambulatory Visit: Payer: Managed Care, Other (non HMO)

## 2016-08-04 ENCOUNTER — Telehealth: Payer: Self-pay | Admitting: *Deleted

## 2016-08-04 NOTE — Telephone Encounter (Signed)
Patient cologuard order has cancelled due to non-activity. Test may be reordered if patient wishes.

## 2016-08-05 ENCOUNTER — Other Ambulatory Visit: Payer: Self-pay | Admitting: Family Medicine

## 2016-08-05 ENCOUNTER — Telehealth: Payer: Self-pay | Admitting: Family Medicine

## 2016-08-05 LAB — BASIC METABOLIC PANEL
BUN/Creatinine Ratio: 13 (ref 9–20)
BUN: 22 mg/dL (ref 6–24)
CALCIUM: 9.2 mg/dL (ref 8.7–10.2)
CO2: 30 mmol/L — AB (ref 18–29)
CREATININE: 1.73 mg/dL — AB (ref 0.76–1.27)
Chloride: 95 mmol/L — ABNORMAL LOW (ref 96–106)
GFR calc Af Amer: 51 mL/min/{1.73_m2} — ABNORMAL LOW (ref >59)
GFR, EST NON AFRICAN AMERICAN: 44 mL/min/{1.73_m2} — AB (ref >59)
GLUCOSE: 142 mg/dL — AB (ref 65–99)
Potassium: 4.2 mmol/L (ref 3.5–5.2)
Sodium: 141 mmol/L (ref 134–144)

## 2016-08-05 NOTE — Telephone Encounter (Signed)
Reviewed lab results from yesterday BMET with elevated Creatinine up from 1.3 to 1.73, BUN only 22 without increase, but calculated anion gap is 16, concerning for metabolic acidosis secondary to CKD or other etiology. Patient recently had confusion symptoms by report, and soonest nephrology was scheduled for 08/09/16, however given recent results, our office contacted [facility] [place] today and provided verbal and fax of this BMET result, they agree to see patient today for work in around 10:40am today.  See nursing note below about notifying patient and wife. I also called patient but did not reach him, left voicemail to remind him about this appointment, explained the urgency.  Note patient was advised to seek more immediate treatment few days ago with onset of symptoms but he declined this option to go to ED or return for evaluation.  Saralyn [name] Karamalegos, DO [facility] 08/05/2016, 9:21 AM

## 2016-08-05 NOTE — Telephone Encounter (Signed)
UPDATE 08/05/16 approx 1130 today received phone call update from [facility] Dr Cherylann RatelLateef after seeing this patient. Dr Cherylann RatelLateef stated that the patient had resumed some minor NSAID use and this may contribute, but overall not significant like last time. However, his main concern was identified patient had run out of Flomax 0.4mg  for past several days and seems that he was experiencing some similar obstruction symptoms after completing this therapy, he was newly started on this in [facility] and given 30 day supply, as he experienced some urinary obstruction at that time that may have contributed to the uremia with AoCKD. Patient had Creatinine re-checked today and repeat lab set for next week Monday. Dr Cherylann RatelLateef has refilled Flomax today, and arranged for close follow-up with new referral to [facility] for further evaluation of urinary obstruction.

## 2016-08-05 NOTE — Telephone Encounter (Signed)
Spoke to spouse of pt advised that pt's blood work is abnormal and Dr. Kirtland BouchardK wants him to be seen by nephrologist sooner than 02/12 and called [place] office and they scheduled his appointment today around 10:40 am as per wife it's hard to wake him up after his work but advised that it's beneficiary for his health to be seen by today.

## 2016-08-23 ENCOUNTER — Ambulatory Visit (INDEPENDENT_AMBULATORY_CARE_PROVIDER_SITE_OTHER): Payer: Managed Care, Other (non HMO) | Admitting: Urology

## 2016-08-23 ENCOUNTER — Encounter: Payer: Self-pay | Admitting: Urology

## 2016-08-23 VITALS — BP 132/88 | HR 112 | Ht 69.0 in | Wt 186.0 lb

## 2016-08-23 DIAGNOSIS — N138 Other obstructive and reflux uropathy: Secondary | ICD-10-CM | POA: Diagnosis not present

## 2016-08-23 DIAGNOSIS — N529 Male erectile dysfunction, unspecified: Secondary | ICD-10-CM

## 2016-08-23 DIAGNOSIS — R339 Retention of urine, unspecified: Secondary | ICD-10-CM

## 2016-08-23 DIAGNOSIS — N401 Enlarged prostate with lower urinary tract symptoms: Secondary | ICD-10-CM

## 2016-08-23 LAB — BLADDER SCAN AMB NON-IMAGING: SCAN RESULT: 141

## 2016-08-23 MED ORDER — SILDENAFIL CITRATE 20 MG PO TABS: ORAL_TABLET | ORAL | 3 refills | Status: DC

## 2016-08-23 NOTE — Progress Notes (Signed)
08/23/2016 11:24 AM   Brett Huber Nov 12, 1965 [id number]  Referring provider: Smitty Cords, DO [address] [place], [place] [address]  Chief Complaint  Patient presents with  . New Patient (Initial Visit)    Urinary Retention referred by Cherylann Ratel MD    HPI: Patient is a 51 yo Caucasian male who presents today as a referral from Dr. Althea Charon for urinary retention.    His IPSS score today is 3, which is mild lower urinary tract symptomatology. He is mixed with his quality life due to his urinary symptoms.  His PVR is 141 mL.   His major complaints today are intermittency and hesitency.  He has had these symptoms for several months. He denies any dysuria, hematuria or suprapubic pain. He currently taking tamsulosin 0.4 mg daily.  He has had a lapse in therapy.  He has been on the medication for six weeks.  He also denies any recent fevers, chills, nausea or vomiting. He does not have a family history of PCa.      IPSS    Row Name 08/23/16 1000         International Prostate Symptom Score   How often have you had the sensation of not emptying your bladder? Less than 1 in 5     How often have you had to urinate less than every two hours? Less than 1 in 5 times     How often have you found you stopped and started again several times when you urinated? Not at All     How often have you found it difficult to postpone urination? Not at All     How often have you had a weak urinary stream? Less than 1 in 5 times     How often have you had to strain to start urination? Not at All     How many times did you typically get up at night to urinate? None     Total IPSS Score 3       Quality of Life due to urinary symptoms   If you were to spend the rest of your life with your urinary condition just the way it is now how would you feel about that? Mixed        Score:  1-7 Mild 8-19 Moderate 20-35 Severe   Erectile dysfunction His SHIM score is 6, which is severe ED.    He has been having difficulty with erections for ten years.   His major complaint is lack of erections.  His libido is preserved.   His risk factors for ED are age, DJD, HTN, HLD, anxiety, depression, antidepressants, pain medication and blood pressure medications.    He denies any painful erections or curvatures with his erections.   He is rarely having spontaneous erections.       SHIM    Row Name 08/23/16 1115         SHIM: Over the last 6 months:   How do you rate your confidence that you could get and keep an erection? Very Low     When you had erections with sexual stimulation, how often were your erections hard enough for penetration (entering your partner)? Almost Never or Never     During sexual intercourse, how often were you able to maintain your erection after you had penetrated (entered) your partner? Very Difficult     During sexual intercourse, how difficult was it to maintain your erection to completion of intercourse? Extremely  Difficult     When you attempted sexual intercourse, how often was it satisfactory for you? Extremely Difficult       SHIM Total Score   SHIM 6        Score: 1-7 Severe ED 8-11 Moderate ED 12-16 Mild-Moderate ED 17-21 Mild ED 22-25 No ED   PMH: Past Medical History:  Diagnosis Date  . Anxiety   . Chronic pain   . DDD (degenerative disc disease)   . Hyperlipidemia   . Hypertension   . Insomnia   . Sinus congestion     Surgical History: Past Surgical History:  Procedure Laterality Date  . CERVICAL FUSION  2004  . CHOLECYSTECTOMY  2007  . neck and disc replacement    . SHOULDER ARTHROSCOPY WITH ROTATOR CUFF REPAIR AND SUBACROMIAL DECOMPRESSION Left 01/29/2013   Procedure: LEFT SHOULDER ARTHROSCOPY WITH ROTATOR CUFF REPAIR AND SUBACROMIAL DECOMPRESSION DISTAL CLAVICLE RESECTION;  Surgeon: Mable Paris, MD;  Location: [facility];  Service: Orthopedics;  Laterality: Left;    Home Medications:  Allergies as  of 08/23/2016      Reactions   Bee Venom Shortness Of Breath      Medication List       Accurate as of 08/23/16 11:24 AM. Always use your most recent med list.          ALPRAZolam 1 MG tablet Commonly known as:  XANAX Take 1 tablet (1 mg total) by mouth at bedtime as needed for sleep.   amLODipine 5 MG tablet Commonly known as:  NORVASC Take 1 tablet (5 mg total) by mouth daily.   fluticasone 50 MCG/ACT nasal spray Commonly known as:  FLONASE Place 2 sprays into both nostrils daily. Use for 4-6 weeks then stop and use seasonally or as needed.   gabapentin 300 MG capsule Commonly known as:  NEURONTIN Take 300 mg by mouth 3 (three) times daily.   HYDROcodone-acetaminophen 5-325 MG tablet Commonly known as:  NORCO/VICODIN Take 1 tablet by mouth every 6 (six) hours as needed for moderate pain.   hydrOXYzine 25 MG capsule Commonly known as:  VISTARIL Take 1-2 capsules (25-50 mg total) by mouth at bedtime as needed (sleep).   olmesartan-hydrochlorothiazide 20-12.5 MG tablet Commonly known as:  BENICAR HCT Take 1 tablet by mouth daily.   oxyCODONE 15 MG immediate release tablet Commonly known as:  ROXICODONE Take 15 mg by mouth 4 (four) times daily as needed for pain.   sildenafil 20 MG tablet Commonly known as:  REVATIO Take 3-5 pills as needed 30 minute prior to sex.   tamsulosin 0.4 MG Caps capsule Commonly known as:  FLOMAX Take 1 capsule (0.4 mg total) by mouth daily.   traZODone 100 MG tablet Commonly known as:  DESYREL Take 1 tablet (100 mg total) by mouth at bedtime.   Vitamin D3 5000 units Caps Take 1 capsule (5,000 Units total) by mouth daily. For 12 weeks, then start Vitamin D3 2,000 units daily (OTC)       Allergies:  Allergies  Allergen Reactions  . Bee Venom Shortness Of Breath    Family History: Family History  Problem Relation Age of Onset  . Diabetes Mother   . Stroke Father   . Prostate cancer Neg Hx   . Kidney cancer Neg Hx   .  Bladder Cancer Neg Hx     Social History:  reports that he has never smoked. His smokeless tobacco use includes Chew. He reports that he drinks alcohol. He  reports that he does not use drugs.  ROS: UROLOGY Frequent Urination?: No Hard to postpone urination?: No Burning/pain with urination?: No Get up at night to urinate?: No Leakage of urine?: No Urine stream starts and stops?: Yes Trouble starting stream?: Yes Do you have to strain to urinate?: No Blood in urine?: No Urinary tract infection?: No Sexually transmitted disease?: No Injury to kidneys or bladder?: Yes Painful intercourse?: No Weak stream?: No Erection problems?: Yes Penile pain?: No  Gastrointestinal Nausea?: No Vomiting?: No Indigestion/heartburn?: No Diarrhea?: No Constipation?: No  Constitutional Fever: No Night sweats?: No Weight loss?: No Fatigue?: Yes  Skin Skin rash/lesions?: No Itching?: No  Eyes Blurred vision?: No Double vision?: No  Ears/Nose/Throat Sore throat?: No Sinus problems?: No  Hematologic/Lymphatic Swollen glands?: No Easy bruising?: No  Cardiovascular Leg swelling?: No Chest pain?: No  Respiratory Cough?: No Shortness of breath?: No  Endocrine Excessive thirst?: No  Musculoskeletal Back pain?: No Joint pain?: No  Neurological Headaches?: No Dizziness?: No  Psychologic Depression?: No Anxiety?: No  Physical Exam: BP 132/88   Pulse (!) 112   Ht 5\' 9"  (1.753 m)   Wt 186 lb (84.4 kg)   BMI 27.47 kg/m   Constitutional: Well nourished. Alert and oriented, No acute distress. HEENT: [place] AT, moist mucus membranes. Trachea midline, no masses. Cardiovascular: No clubbing, cyanosis, or edema. Respiratory: Normal respiratory effort, no increased work of breathing. GI: Abdomen is soft, non tender, non distended, no abdominal masses. Liver and spleen not palpable.  No hernias appreciated.  Stool sample for occult testing is not indicated.   GU: No CVA  tenderness.  No bladder fullness or masses.  Patient with circumcise phallus.  Urethral meatus is patent.  No penile discharge. No penile lesions or rashes. Scrotum without lesions, cysts, rashes and/or edema.  Testicles are located scrotally bilaterally. No masses are appreciated in the testicles. Left and right epididymis are normal. Rectal: Patient with  normal sphincter tone. Anus and perineum without scarring or rashes. No rectal masses are appreciated. Prostate is approximately 45 grams, no nodules are appreciated. Seminal vesicles are normal. Skin: No rashes, bruises or suspicious lesions. Lymph: No cervical or inguinal adenopathy. Neurologic: Grossly intact, no focal deficits, moving all 4 extremities. Psychiatric: Normal mood and affect.  Laboratory Data: Lab Results  Component Value Date   WBC 8.6 07/04/2016   HGB 13.4 07/04/2016   HCT 37.7 (L) 07/04/2016   MCV 81.5 07/04/2016   PLT 274 07/04/2016    Lab Results  Component Value Date   CREATININE 1.73 (H) 08/04/2016    Lab Results  Component Value Date   HGBA1C 5.6 06/04/2016    Lab Results  Component Value Date   TSH 3.78 06/04/2016       Component Value Date/Time   CHOL 178 06/04/2016 0001   HDL 42 06/04/2016 0001   CHOLHDL 4.2 06/04/2016 0001   VLDL 32 (H) 06/04/2016 0001   LDLCALC 104 (H) 06/04/2016 0001    Lab Results  Component Value Date   AST 86 (H) 07/04/2016   Lab Results  Component Value Date   ALT 95 (H) 07/04/2016     Pertinent Imaging: Results for DIARRA, KOS (MRN [id number]) as of 08/23/2016 11:08  Ref. Range 08/23/2016 10:42  Scan Result Unknown 141    Assessment & Plan:    1. BPH with LUTS  - IPSS score is 3/3  - Continue conservative management, avoiding bladder irritants and timed voiding's  - Continue tamsulosin 0.4 mg  daily  - PVR is not significant enough to cause renal issues at this time - will continue to monitor  - RTC pending PSA results  2. Erectile  dysfunction  - SHIM score is 6  - I explained to the patient that in order to achieve an erection it takes good functioning of the nervous system (parasympathetic, sympathetic, sensory and motor), good blood flow into the erectile tissue of the penis and a desire to have sex  - I explained that conditions like diabetes, hypertension, coronary artery disease, peripheral vascular disease, smoking, alcohol consumption, age, sleep apnea and BPH can diminish the ability to have an erection  - RTC pending PSA results  Return for pending PSA results.  These notes generated with voice recognition software. I apologize for typographical errors.  Michiel CowboySHANNON Ophelia Sipe, PA-C  [facility] [address], [address] [place], [place] [address] [phone]

## 2016-08-24 ENCOUNTER — Telehealth: Payer: Self-pay

## 2016-08-24 LAB — PSA: Prostate Specific Ag, Serum: 1 ng/mL (ref 0.0–4.0)

## 2016-08-24 NOTE — Telephone Encounter (Signed)
-----   Message from Harle BattiestShannon A McGowan, PA-C sent at 08/24/2016 11:39 AM EST ----- Please notify the patient that his PSA is stable at 1.0 and we will see him in one year.

## 2016-08-24 NOTE — Telephone Encounter (Signed)
LMOM-most recent labs are stable. Will see in 1yr.  

## 2016-08-27 ENCOUNTER — Encounter: Payer: Self-pay | Admitting: Emergency Medicine

## 2016-08-27 ENCOUNTER — Inpatient Hospital Stay
Admission: EM | Admit: 2016-08-27 | Discharge: 2016-08-30 | DRG: 682 | Disposition: A | Discharge status: Home / Self Care | Payer: Managed Care, Other (non HMO) | Attending: Internal Medicine | Admitting: Internal Medicine

## 2016-08-27 ENCOUNTER — Emergency Department: Payer: Managed Care, Other (non HMO)

## 2016-08-27 DIAGNOSIS — G8929 Other chronic pain: Secondary | ICD-10-CM | POA: Diagnosis present

## 2016-08-27 DIAGNOSIS — G9341 Metabolic encephalopathy: Secondary | ICD-10-CM

## 2016-08-27 DIAGNOSIS — R809 Proteinuria, unspecified: Secondary | ICD-10-CM

## 2016-08-27 DIAGNOSIS — Z9049 Acquired absence of other specified parts of digestive tract: Secondary | ICD-10-CM | POA: Diagnosis not present

## 2016-08-27 DIAGNOSIS — N320 Bladder-neck obstruction: Secondary | ICD-10-CM | POA: Diagnosis present

## 2016-08-27 DIAGNOSIS — I129 Hypertensive chronic kidney disease with stage 1 through stage 4 chronic kidney disease, or unspecified chronic kidney disease: Secondary | ICD-10-CM | POA: Diagnosis present

## 2016-08-27 DIAGNOSIS — N170 Acute kidney failure with tubular necrosis: Principal | ICD-10-CM | POA: Diagnosis present

## 2016-08-27 DIAGNOSIS — N139 Obstructive and reflux uropathy, unspecified: Secondary | ICD-10-CM | POA: Diagnosis not present

## 2016-08-27 DIAGNOSIS — I248 Other forms of acute ischemic heart disease: Secondary | ICD-10-CM | POA: Diagnosis present

## 2016-08-27 DIAGNOSIS — N401 Enlarged prostate with lower urinary tract symptoms: Secondary | ICD-10-CM

## 2016-08-27 DIAGNOSIS — R739 Hyperglycemia, unspecified: Secondary | ICD-10-CM | POA: Diagnosis present

## 2016-08-27 DIAGNOSIS — N183 Chronic kidney disease, stage 3 unspecified: Secondary | ICD-10-CM | POA: Diagnosis present

## 2016-08-27 DIAGNOSIS — N189 Chronic kidney disease, unspecified: Secondary | ICD-10-CM | POA: Diagnosis present

## 2016-08-27 DIAGNOSIS — N19 Unspecified kidney failure: Secondary | ICD-10-CM

## 2016-08-27 DIAGNOSIS — R319 Hematuria, unspecified: Secondary | ICD-10-CM | POA: Diagnosis present

## 2016-08-27 DIAGNOSIS — F17220 Nicotine dependence, chewing tobacco, uncomplicated: Secondary | ICD-10-CM | POA: Diagnosis present

## 2016-08-27 DIAGNOSIS — I429 Cardiomyopathy, unspecified: Secondary | ICD-10-CM

## 2016-08-27 DIAGNOSIS — R0689 Other abnormalities of breathing: Secondary | ICD-10-CM | POA: Diagnosis not present

## 2016-08-27 DIAGNOSIS — I2489 Other forms of acute ischemic heart disease: Secondary | ICD-10-CM

## 2016-08-27 DIAGNOSIS — I959 Hypotension, unspecified: Secondary | ICD-10-CM | POA: Diagnosis present

## 2016-08-27 DIAGNOSIS — N400 Enlarged prostate without lower urinary tract symptoms: Secondary | ICD-10-CM

## 2016-08-27 DIAGNOSIS — D649 Anemia, unspecified: Secondary | ICD-10-CM | POA: Diagnosis present

## 2016-08-27 DIAGNOSIS — R338 Other retention of urine: Secondary | ICD-10-CM | POA: Diagnosis not present

## 2016-08-27 DIAGNOSIS — Z833 Family history of diabetes mellitus: Secondary | ICD-10-CM

## 2016-08-27 DIAGNOSIS — D72829 Elevated white blood cell count, unspecified: Secondary | ICD-10-CM

## 2016-08-27 DIAGNOSIS — N138 Other obstructive and reflux uropathy: Secondary | ICD-10-CM | POA: Diagnosis present

## 2016-08-27 DIAGNOSIS — Z79899 Other long term (current) drug therapy: Secondary | ICD-10-CM

## 2016-08-27 DIAGNOSIS — J159 Unspecified bacterial pneumonia: Secondary | ICD-10-CM

## 2016-08-27 DIAGNOSIS — J810 Acute pulmonary edema: Secondary | ICD-10-CM

## 2016-08-27 DIAGNOSIS — T40601A Poisoning by unspecified narcotics, accidental (unintentional), initial encounter: Secondary | ICD-10-CM

## 2016-08-27 DIAGNOSIS — J9601 Acute respiratory failure with hypoxia: Secondary | ICD-10-CM | POA: Diagnosis present

## 2016-08-27 DIAGNOSIS — Z823 Family history of stroke: Secondary | ICD-10-CM | POA: Diagnosis not present

## 2016-08-27 DIAGNOSIS — Z981 Arthrodesis status: Secondary | ICD-10-CM

## 2016-08-27 DIAGNOSIS — N182 Chronic kidney disease, stage 2 (mild): Secondary | ICD-10-CM | POA: Diagnosis present

## 2016-08-27 DIAGNOSIS — N179 Acute kidney failure, unspecified: Secondary | ICD-10-CM | POA: Diagnosis not present

## 2016-08-27 DIAGNOSIS — G4700 Insomnia, unspecified: Secondary | ICD-10-CM | POA: Diagnosis present

## 2016-08-27 DIAGNOSIS — E870 Hyperosmolality and hypernatremia: Secondary | ICD-10-CM | POA: Diagnosis present

## 2016-08-27 LAB — URINE DRUG SCREEN, QUALITATIVE (ARMC ONLY)
AMPHETAMINES, UR SCREEN: NOT DETECTED
BENZODIAZEPINE, UR SCRN: POSITIVE — AB
Barbiturates, Ur Screen: NOT DETECTED
Cannabinoid 50 Ng, Ur ~~LOC~~: NOT DETECTED
Cocaine Metabolite,Ur ~~LOC~~: NOT DETECTED
MDMA (ECSTASY) UR SCREEN: NOT DETECTED
METHADONE SCREEN, URINE: NOT DETECTED
Opiate, Ur Screen: POSITIVE — AB
Phencyclidine (PCP) Ur S: NOT DETECTED
TRICYCLIC, UR SCREEN: NOT DETECTED

## 2016-08-27 LAB — URINALYSIS, COMPLETE (UACMP) WITH MICROSCOPIC
Bacteria, UA: NONE SEEN
Bilirubin Urine: NEGATIVE
GLUCOSE, UA: 50 mg/dL — AB
Ketones, ur: NEGATIVE mg/dL
Leukocytes, UA: NEGATIVE
NITRITE: NEGATIVE
PH: 5 (ref 5.0–8.0)
PROTEIN: 30 mg/dL — AB
Specific Gravity, Urine: 1.011 (ref 1.005–1.030)

## 2016-08-27 LAB — COMPREHENSIVE METABOLIC PANEL
ALT: 61 U/L (ref 17–63)
ANION GAP: 16 — AB (ref 5–15)
AST: 116 U/L — ABNORMAL HIGH (ref 15–41)
Albumin: 3.6 g/dL (ref 3.5–5.0)
Alkaline Phosphatase: 35 U/L — ABNORMAL LOW (ref 38–126)
BILIRUBIN TOTAL: 0.8 mg/dL (ref 0.3–1.2)
BUN: 60 mg/dL — ABNORMAL HIGH (ref 6–20)
CO2: 22 mmol/L (ref 22–32)
Calcium: 8.3 mg/dL — ABNORMAL LOW (ref 8.9–10.3)
Chloride: 91 mmol/L — ABNORMAL LOW (ref 101–111)
Creatinine, Ser: 7.58 mg/dL — ABNORMAL HIGH (ref 0.61–1.24)
GFR calc Af Amer: 8 mL/min — ABNORMAL LOW (ref >60)
GFR, EST NON AFRICAN AMERICAN: 7 mL/min — AB (ref >60)
Glucose, Bld: 137 mg/dL — ABNORMAL HIGH (ref 65–99)
POTASSIUM: 4.7 mmol/L (ref 3.5–5.1)
Sodium: 129 mmol/L — ABNORMAL LOW (ref 135–145)
TOTAL PROTEIN: 6.7 g/dL (ref 6.5–8.1)

## 2016-08-27 LAB — BASIC METABOLIC PANEL
Anion gap: 9 (ref 5–15)
BUN: 49 mg/dL — ABNORMAL HIGH (ref 6–20)
CALCIUM: 8 mg/dL — AB (ref 8.9–10.3)
CHLORIDE: 100 mmol/L — AB (ref 101–111)
CO2: 22 mmol/L (ref 22–32)
Creatinine, Ser: 5.22 mg/dL — ABNORMAL HIGH (ref 0.61–1.24)
GFR calc non Af Amer: 11 mL/min — ABNORMAL LOW (ref >60)
GFR, EST AFRICAN AMERICAN: 13 mL/min — AB (ref >60)
GLUCOSE: 140 mg/dL — AB (ref 65–99)
Potassium: 4.1 mmol/L (ref 3.5–5.1)
Sodium: 131 mmol/L — ABNORMAL LOW (ref 135–145)

## 2016-08-27 LAB — CBC WITH DIFFERENTIAL/PLATELET
BASOS PCT: 0 %
Basophils Absolute: 0 10*3/uL (ref 0–0.1)
EOS PCT: 0 %
Eosinophils Absolute: 0 10*3/uL (ref 0–0.7)
HEMATOCRIT: 32.4 % — AB (ref 40.0–52.0)
Hemoglobin: 11.2 g/dL — ABNORMAL LOW (ref 13.0–18.0)
Lymphocytes Relative: 1 %
Lymphs Abs: 0.2 10*3/uL — ABNORMAL LOW (ref 1.0–3.6)
MCH: 28.5 pg (ref 26.0–34.0)
MCHC: 34.5 g/dL (ref 32.0–36.0)
MCV: 82.8 fL (ref 80.0–100.0)
MONO ABS: 0.9 10*3/uL (ref 0.2–1.0)
MONOS PCT: 5 %
Neutro Abs: 15.2 10*3/uL — ABNORMAL HIGH (ref 1.4–6.5)
Neutrophils Relative %: 94 %
Platelets: 266 10*3/uL (ref 150–440)
RBC: 3.91 MIL/uL — ABNORMAL LOW (ref 4.40–5.90)
RDW: 14.7 % — AB (ref 11.5–14.5)
WBC: 16.4 10*3/uL — ABNORMAL HIGH (ref 3.8–10.6)

## 2016-08-27 LAB — BLOOD GAS, VENOUS
Acid-base deficit: 3.4 mmol/L — ABNORMAL HIGH (ref 0.0–2.0)
Bicarbonate: 23.9 mmol/L (ref 20.0–28.0)
O2 Saturation: 51.5 %
PCO2 VEN: 52 mmHg (ref 44.0–60.0)
PH VEN: 7.27 (ref 7.250–7.430)
Patient temperature: 37
pO2, Ven: 32 mmHg (ref 32.0–45.0)

## 2016-08-27 LAB — SALICYLATE LEVEL

## 2016-08-27 LAB — INFLUENZA PANEL BY PCR (TYPE A & B)
Influenza A By PCR: NEGATIVE
Influenza B By PCR: NEGATIVE

## 2016-08-27 LAB — BRAIN NATRIURETIC PEPTIDE: B NATRIURETIC PEPTIDE 5: 174 pg/mL — AB (ref 0.0–100.0)

## 2016-08-27 LAB — GLUCOSE, CAPILLARY
Glucose-Capillary: 134 mg/dL — ABNORMAL HIGH (ref 65–99)
Glucose-Capillary: 142 mg/dL — ABNORMAL HIGH (ref 65–99)

## 2016-08-27 LAB — TROPONIN I
TROPONIN I: 0.24 ng/mL — AB (ref <0.03)
TROPONIN I: 0.38 ng/mL — AB (ref <0.03)
Troponin I: 0.4 ng/mL (ref <0.03)

## 2016-08-27 LAB — ACETAMINOPHEN LEVEL

## 2016-08-27 LAB — MRSA PCR SCREENING: MRSA BY PCR: NEGATIVE

## 2016-08-27 LAB — LIPASE, BLOOD: Lipase: <10 U/L — ABNORMAL LOW (ref 11–51)

## 2016-08-27 LAB — PROCALCITONIN: PROCALCITONIN: 1.62 ng/mL

## 2016-08-27 LAB — MAGNESIUM: Magnesium: 2.5 mg/dL — ABNORMAL HIGH (ref 1.7–2.4)

## 2016-08-27 LAB — LACTIC ACID, PLASMA: LACTIC ACID, VENOUS: 1.9 mmol/L (ref 0.5–1.9)

## 2016-08-27 MED ORDER — CEFTRIAXONE SODIUM-DEXTROSE 2-2.22 GM-% IV SOLR
2.0000 g | INTRAVENOUS | Status: DC
  Filled 2016-08-27: qty 50

## 2016-08-27 MED ORDER — HEPARIN SODIUM (PORCINE) 5000 UNIT/ML IJ SOLN
5000.0000 [IU] | Freq: Three times a day (TID) | INTRAMUSCULAR | Status: DC
  Administered 2016-08-27 – 2016-08-29 (×6): 5000 [IU] via SUBCUTANEOUS
  Filled 2016-08-27 (×8): qty 1

## 2016-08-27 MED ORDER — NALOXONE HCL 2 MG/2ML IJ SOSY
0.5000 mg | PREFILLED_SYRINGE | Freq: Once | INTRAMUSCULAR | Status: AC
  Administered 2016-08-27: 0.5 mg via INTRAVENOUS
  Filled 2016-08-27: qty 2

## 2016-08-27 MED ORDER — NALOXONE HCL 2 MG/2ML IJ SOSY
1.0000 mg | PREFILLED_SYRINGE | Freq: Once | INTRAMUSCULAR | Status: AC
  Administered 2016-08-27: 1 mg via INTRAVENOUS

## 2016-08-27 MED ORDER — ACETAMINOPHEN 325 MG PO TABS
650.0000 mg | ORAL_TABLET | ORAL | Status: DC | PRN
  Administered 2016-08-29 – 2016-08-30 (×2): 650 mg via ORAL
  Filled 2016-08-27 (×3): qty 2

## 2016-08-27 MED ORDER — CEFTRIAXONE SODIUM-DEXTROSE 2-2.22 GM-% IV SOLR
2.0000 g | INTRAVENOUS | Status: DC
  Administered 2016-08-27: 2 g via INTRAVENOUS
  Filled 2016-08-27 (×2): qty 50

## 2016-08-27 MED ORDER — SODIUM CHLORIDE 0.9 % IV SOLN
250.0000 mL | INTRAVENOUS | Status: DC | PRN
  Administered 2016-08-27: 1000 mL via INTRAVENOUS

## 2016-08-27 MED ORDER — SODIUM CHLORIDE 0.9 % IV BOLUS (SEPSIS)
1000.0000 mL | Freq: Once | INTRAVENOUS | Status: AC
  Administered 2016-08-27: 1000 mL via INTRAVENOUS

## 2016-08-27 MED ORDER — ONDANSETRON HCL 4 MG/2ML IJ SOLN: 4.0000 mg | Freq: Four times a day (QID) | INTRAMUSCULAR | Status: DC | PRN

## 2016-08-27 MED ORDER — ASPIRIN 81 MG PO CHEW: 324.0000 mg | CHEWABLE_TABLET | ORAL | Status: DC

## 2016-08-27 MED ORDER — NALOXONE HCL 2 MG/2ML IJ SOSY
PREFILLED_SYRINGE | INTRAMUSCULAR | Status: AC
  Administered 2016-08-27: 0.5 mg/mL
  Filled 2016-08-27: qty 2

## 2016-08-27 MED ORDER — ASPIRIN 300 MG RE SUPP: 300.0000 mg | RECTAL | Status: DC

## 2016-08-27 MED ORDER — ORAL CARE MOUTH RINSE
15.0000 mL | Freq: Two times a day (BID) | OROMUCOSAL | Status: DC
  Administered 2016-08-28 – 2016-08-29 (×2): 15 mL via OROMUCOSAL

## 2016-08-27 MED ORDER — TAMSULOSIN HCL 0.4 MG PO CAPS
0.4000 mg | ORAL_CAPSULE | Freq: Every day | ORAL | Status: DC
  Administered 2016-08-27 – 2016-08-30 (×4): 0.4 mg via ORAL
  Filled 2016-08-27 (×4): qty 1

## 2016-08-27 MED ORDER — NALOXONE HCL 2 MG/2ML IJ SOSY
0.7000 mg/h | PREFILLED_SYRINGE | INTRAVENOUS | Status: DC
  Administered 2016-08-27 – 2016-08-28 (×4): 0.7 mg/h via INTRAVENOUS
  Filled 2016-08-27 (×4): qty 4

## 2016-08-27 NOTE — ED Provider Notes (Signed)
[facility] Emergency Department Provider Note   ____________________________________________   I have reviewed the triage vital signs and the nursing notes.   HISTORY  Chief Complaint Altered Mental Status  EM caveat: Patient unresponsive to verbal stimuli, non-verbal on arrival  HPI Brett Huber is a 51 y.o. male   History provided by a coworker. His wife finally called to the patient was not responding well, his coworker went to his house and found him "unresponsive" and brought him to the emergency department.  After receiving naloxone and oxygen therapy for hypoxia, the patient became alert and oriented to self provide the following history  Patient reports that he has chronic neck pain, about 2 hours ago he took Percocet plus oxycodone at his normal dose, and also using Xanax. He does not recall much after that. He denies any chest pain. Denies that he was try to harm himself or anyone else. He does have a history of recent kidney problems, but this was improved after he was taken off medication iburofen.     Past Medical History:  Diagnosis Date  . Anxiety   . Chronic pain   . DDD (degenerative disc disease)   . Hyperlipidemia   . Hypertension   . Insomnia   . Sinus congestion     Patient Active Problem List   Diagnosis Date Noted  . Hearing loss of left ear 07/19/2016  . Acute on chronic renal failure (HCC) 07/02/2016  . Colon cancer screening 06/04/2016  . Abnormal glucose 06/04/2016  . Hypertension 07/23/2015  . Hyperlipidemia 07/23/2015  . Anxiety and depression 07/23/2015  . Chronic pain 07/23/2015  . Insomnia 07/23/2015  . Erectile dysfunction 07/23/2015  . Vitamin D deficiency 07/23/2015    Past Surgical History:  Procedure Laterality Date  . CERVICAL FUSION  2004  . CHOLECYSTECTOMY  2007  . neck and disc replacement    . SHOULDER ARTHROSCOPY WITH ROTATOR CUFF REPAIR AND SUBACROMIAL DECOMPRESSION Left 01/29/2013     Procedure: LEFT SHOULDER ARTHROSCOPY WITH ROTATOR CUFF REPAIR AND SUBACROMIAL DECOMPRESSION DISTAL CLAVICLE RESECTION;  Surgeon: Mable Paris, MD;  Location: [facility];  Service: Orthopedics;  Laterality: Left;    Prior to Admission medications   Medication Sig Start Date End Date Taking? Authorizing Provider  ALPRAZolam Prudy Feeler) 1 MG tablet Take 1 tablet (1 mg total) by mouth at bedtime as needed for sleep. 07/19/16  Yes Alexander J Karamalegos, DO  amLODipine (NORVASC) 5 MG tablet Take 1 tablet (5 mg total) by mouth daily. 07/19/16  Yes Alexander J Karamalegos, DO  fluticasone (FLONASE) 50 MCG/ACT nasal spray Place 2 sprays into both nostrils daily. Use for 4-6 weeks then stop and use seasonally or as needed. 06/04/16  Yes Alexander Fredric Mare, DO  gabapentin (NEURONTIN) 300 MG capsule Take 300 mg by mouth 4 (four) times daily.    Yes Historical Provider, MD  olmesartan-hydrochlorothiazide (BENICAR HCT) 20-12.5 MG tablet Take 1 tablet by mouth daily. 07/19/16  Yes Alexander Fredric Mare, DO  sildenafil (REVATIO) 20 MG tablet Take 3-5 pills as needed 30 minute prior to sex. 08/23/16  Yes Shannon A McGowan, PA-C  tamsulosin (FLOMAX) 0.4 MG CAPS capsule Take 1 capsule (0.4 mg total) by mouth daily. 07/06/16  Yes Altamese Dilling, MD  traZODone (DESYREL) 100 MG tablet Take 1 tablet (100 mg total) by mouth at bedtime. 07/19/16  Yes Alexander Fredric Mare, DO  Cholecalciferol (VITAMIN D3) 5000 units CAPS Take 1 capsule (5,000 Units total) by mouth  daily. For 12 weeks, then start Vitamin D3 2,000 units daily (OTC) Patient not taking: Reported on 08/23/2016 06/08/16   Smitty CordsAlexander J Karamalegos, DO  HYDROcodone-acetaminophen (NORCO/VICODIN) 5-325 MG tablet Take 1 tablet by mouth every 6 (six) hours as needed for moderate pain. Patient not taking: Reported on 08/27/2016 07/05/16   Altamese DillingVaibhavkumar Vachhani, MD  hydrOXYzine (VISTARIL) 25 MG capsule Take 1-2 capsules (25-50 mg total) by  mouth at bedtime as needed (sleep). Patient not taking: Reported on 08/23/2016 05/25/16   Smitty CordsAlexander J Karamalegos, DO  oxyCODONE (ROXICODONE) 15 MG immediate release tablet Take 15 mg by mouth 4 (four) times daily as needed for pain.    Historical Provider, MD    Allergies Bee venom  Family History  Problem Relation Age of Onset  . Diabetes Mother   . Stroke Father   . Prostate cancer Neg Hx   . Kidney cancer Neg Hx   . Bladder Cancer Neg Hx     Social History Social History  Substance Use Topics  . Smoking status: Never Smoker  . Smokeless tobacco: Current User    Types: Chew  . Alcohol use Yes     Comment: occ    Review of Systems -is obtained after the patient awoke Constitutional: No fever/chills Eyes: No visual changes. ENT: No sore throat. Cardiovascular: Denies chest pain. Respiratory: Denies shortness of breath. Gastrointestinal: No abdominal pain.  No nausea, no vomiting.  No diarrhea.  No constipation. Genitourinary: Negative for dysuria. Musculoskeletal: Negative for back pain. Has chronic neck pain, but present reports none. Skin: Negative for rash. Neurological: Negative for headaches, focal weakness or numbness.  10-point ROS otherwise negative.  ____________________________________________   PHYSICAL EXAM:  VITAL SIGNS: ED Triage Vitals  Enc Vitals Group     BP --      Pulse Rate 08/27/16 1121 (!) 130     Resp 08/27/16 1121 (!) 30     Temp --      Temp src --      SpO2 08/27/16 1120 (!) 68 %     Weight 08/27/16 1119 186 lb (84.4 kg)     Height 08/27/16 1119 5\' 9"  (1.753 m)     Head Circumference --      Peak Flow --      Pain Score --      Pain Loc --      Pain Edu? --      Excl. in GC? --     Constitutional: The patient is bradycardic, unresponsive, somewhat cyanotic in appearance his extremities when rolled back in the treatment room. I was present immediately, patient was placed on oxygen therapy, room air saturation noted to be as low  as mid 70s on arrival. After being placed on nonrebreather, patient's saturations rapidly improved into the mid to high 90s. His color improved rapidly. Eyes: Conjunctivae are normal. Pupils are constricted bilaterally. Head: Atraumatic. Nose: No congestion/rhinnorhea. Mouth/Throat: Mucous membranes are moist.  Oropharynx non-erythematous. Neck: No stridor.   Cardiovascular: Tachycardic rate, regular rhythm. Grossly normal heart sounds.  Good peripheral circulation. Respiratory: Bradycardic, low volume respirations.  No retractions. Lungs CTAB. Gastrointestinal: Soft and nontender. No distention.  Musculoskeletal: No lower extremity tenderness nor edema.   Neurologic:  Patient minimally responsive to tactile stimuli, will slightly open his eyes. Not following any commands. Obtunded. Skin:  Skin is warm, dry and intact. No rash noted.  ----------------------------------------- 11:38 AM on 08/27/2016 -----------------------------------------  The patient is now awake and alert. He is not complaining of  any pain or discomfort. On exam he moves all extremities equally, normal cranial nerve exam, normal strength in all extremities, no sensory deficits, follows commands with intact extraocular movements. Denies any headache.  He reports no suicidal or homicidal ideation. He reports that he was treating neck pain with oxycodone and Percocet today, and also using Xanax at home. He was using this because of poor sleep and chronic pain.   ____________________________________________   LABS (all labs ordered are listed, but only abnormal results are displayed)  Labs Reviewed  GLUCOSE, CAPILLARY - Abnormal; Notable for the following:       Result Value   Glucose-Capillary 134 (*)    All other components within normal limits  CBC WITH DIFFERENTIAL/PLATELET - Abnormal; Notable for the following:    WBC 16.4 (*)    RBC 3.91 (*)    Hemoglobin 11.2 (*)    HCT 32.4 (*)    RDW 14.7 (*)    Neutro  Abs 15.2 (*)    Lymphs Abs 0.2 (*)    All other components within normal limits  COMPREHENSIVE METABOLIC PANEL - Abnormal; Notable for the following:    Sodium 129 (*)    Chloride 91 (*)    Glucose, Bld 137 (*)    BUN 60 (*)    Creatinine, Ser 7.58 (*)    Calcium 8.3 (*)    AST 116 (*)    Alkaline Phosphatase 35 (*)    GFR calc non Af Amer 7 (*)    GFR calc Af Amer 8 (*)    Anion gap 16 (*)    All other components within normal limits  TROPONIN I - Abnormal; Notable for the following:    Troponin I 0.24 (*)    All other components within normal limits  LIPASE, BLOOD - Abnormal; Notable for the following:    Lipase <10 (*)    All other components within normal limits  BLOOD GAS, VENOUS - Abnormal; Notable for the following:    Acid-base deficit 3.4 (*)    All other components within normal limits  URINE DRUG SCREEN, QUALITATIVE ([facility] ONLY) - Abnormal; Notable for the following:    Opiate, Ur Screen POSITIVE (*)    Benzodiazepine, Ur Scrn POSITIVE (*)    All other components within normal limits  ACETAMINOPHEN LEVEL - Abnormal; Notable for the following:    Acetaminophen (Tylenol), Serum <10 (*)    All other components within normal limits  URINALYSIS, COMPLETE (UACMP) WITH MICROSCOPIC - Abnormal; Notable for the following:    Color, Urine YELLOW (*)    APPearance CLOUDY (*)    Glucose, UA 50 (*)    Hgb urine dipstick LARGE (*)    Protein, ur 30 (*)    Squamous Epithelial / LPF 0-5 (*)    All other components within normal limits  MAGNESIUM - Abnormal; Notable for the following:    Magnesium 2.5 (*)    All other components within normal limits  BRAIN NATRIURETIC PEPTIDE - Abnormal; Notable for the following:    B Natriuretic Peptide 174.0 (*)    All other components within normal limits  TROPONIN I - Abnormal; Notable for the following:    Troponin I 0.40 (*)    All other components within normal limits  TROPONIN I - Abnormal; Notable for the following:    Troponin I  0.38 (*)    All other components within normal limits  GLUCOSE, CAPILLARY - Abnormal; Notable for the following:    Glucose-Capillary 142 (*)  All other components within normal limits  BASIC METABOLIC PANEL - Abnormal; Notable for the following:    Sodium 131 (*)    Chloride 100 (*)    Glucose, Bld 140 (*)    BUN 49 (*)    Creatinine, Ser 5.22 (*)    Calcium 8.0 (*)    GFR calc non Af Amer 11 (*)    GFR calc Af Amer 13 (*)    All other components within normal limits  MRSA PCR SCREENING  CULTURE, BLOOD (ROUTINE X 2)  CULTURE, BLOOD (ROUTINE X 2)  URINE CULTURE  LACTIC ACID, PLASMA  SALICYLATE LEVEL  PROCALCITONIN  TROPONIN I  INFLUENZA PANEL BY PCR (TYPE A & B)  CBC  BASIC METABOLIC PANEL  MAGNESIUM  PHOSPHORUS  PROCALCITONIN   ____________________________________________  EKG  Reviewed and interpreted by me at 11:33 AM Heart rate 1:15 QRS 110 QTc 4:30 Sinus tachycardia, there is slurring of the T waves noted in multiple leads, there is questionable elevation with a somewhat saddleback appearance noted in V2, though no obvious evidence of acute MI is noted. In addition the patient reports no pain or symptomatology at this time. ____________________________________________  RADIOLOGY  Dg Chest Port 1 View  Result Date: 08/27/2016 CLINICAL DATA:  Unresponsive. EXAM: PORTABLE CHEST 1 VIEW COMPARISON:  07/02/2016 . FINDINGS: Cardiomegaly with mild pulmonary vascular prominence. Diffuse interstitial prominence. Mild basilar atelectasis. No pleural effusion or pneumothorax. Cervical spine fusion. No acute bony abnormality . Cervical spine fusion. IMPRESSION: Congestive heart failure with bilateral pulmonary interstitial edema . Mild basilar atelectasis . Electronically Signed   By: Maisie Fus  Register   On: 08/27/2016 11:39    ____________________________________________   PROCEDURES  Procedure(s) performed: None  Procedures  Critical Care performed: Yes, see  critical care note(s)  CRITICAL CARE Performed by: Sharyn Creamer   Total critical care time: 50 minutes  Critical care time was exclusive of separately billable procedures and treating other patients.  Critical care was necessary to treat or prevent imminent or life-threatening deterioration.  Critical care was time spent personally by me on the following activities: development of treatment plan with patient and/or surrogate as well as nursing, discussions with consultants, evaluation of patient's response to treatment, examination of patient, obtaining history from patient or surrogate, ordering and performing treatments and interventions, ordering and review of laboratory studies, ordering and review of radiographic studies, pulse oximetry and re-evaluation of patient's condition.  ____________________________________________   INITIAL IMPRESSION / ASSESSMENT AND PLAN / ED COURSE  Pertinent labs & imaging results that were available during my care of the patient were reviewed by me and considered in my medical decision making (see chart for details).    Clinical Course as of Aug 27 2052  Fri Aug 27, 2016  1159 Patient hemodynamics improving, but mild hypotension with systolic blood pressure 89. Second liter of fluid ordered. Continue to await chemistry and remaining labs. The patient is awake alert, follows commands in no distress.  [MQ]  1211 DG Chest Port 1 View [MQ]  1404 Patient being admitted to the ICU. Map 67, blood pressure 87/56 at this time. Awake and alert. No distress. Appears improved.  [MQ]    Clinical Course User Index [MQ] Sharyn Creamer, MD    ----------------------------------------- 12:29 PM on 08/27/2016 -----------------------------------------  Case and care discussed with Dr. Ronn Melena, of nephrology. Advises early fluid resuscitation, the patient may need to be transitioned to pressor support if increasing pulmonary edema.    ----------------------------------------- 12:32 PM on 08/27/2016 -----------------------------------------  Case discussed with nurse practitioner from intensive care unit team. They'll be coming to evaluate him. The patient's mental status is improving after repeat dose of naloxone, a naloxone infusion has been written, and in addition has blood pressures improved to 100 systolic. He is awake alert and oriented able to follow commands this time. ____________________________________________   FINAL CLINICAL IMPRESSION(S) / ED DIAGNOSES  Final diagnoses:  AKI (acute kidney injury) (HCC)  Narcotic overdose, accidental or unintentional, initial encounter  Uremia  Hypotension, unspecified hypotension type  Acute pulmonary edema (HCC)      NEW MEDICATIONS STARTED DURING THIS VISIT:  Current Discharge Medication List       Note:  This document was prepared using Dragon voice recognition software and may include unintentional dictation errors.     Sharyn Creamer, MD 08/27/16 [phone]

## 2016-08-27 NOTE — Progress Notes (Signed)
[facility]  ROUNDING NOTE   Subjective:   [name] [facility] ICU fainting   Patient found to have creatinine of 7.58 from 1.26, GFR > 60 on 2/26 at our office.   Patient placed on naloxone and is waking up some. He states he took oxycodone for his chronic pain.   Objective:  Vital signs in last 24 hours:  Pulse Rate:  [102-130] 102 (03/02 1530) Resp:  [17-30] 18 (03/02 1530) BP: (75-114)/(47-80) 97/66 (03/02 1530) SpO2:  [68 %-100 %] 96 % (03/02 1530) Weight:  [84.4 kg (186 lb)] 84.4 kg (186 lb) (03/02 1119)  Weight change:  Filed Weights   08/27/16 1119  Weight: 84.4 kg (186 lb)    Intake/Output: No intake/output data recorded.   Intake/Output this shift:  No intake/output data recorded.  Physical Exam: General: NAD, laying in bed  Head: Normocephalic, atraumatic. Moist oral mucosal membranes  Eyes: Anicteric, PERRL  Neck: Supple, trachea midline  Lungs:  Clear to auscultation  Heart: Regular rate and rhythm  Abdomen:  Soft, nontender,   Extremities: no peripheral edema.  Neurologic: Nonfocal, moving all four extremities  Skin: No lesions  GU: Foley with clear yellow urine    Basic Metabolic Panel:  Recent Labs Lab 08/27/16 1121  NA 129*  K 4.7  CL 91*  CO2 22  GLUCOSE 137*  BUN 60*  CREATININE 7.58*  CALCIUM 8.3*  MG 2.5*    Liver Function Tests:  Recent Labs Lab 08/27/16 1121  AST 116*  ALT 61  ALKPHOS 35*  BILITOT 0.8  PROT 6.7  ALBUMIN 3.6    Recent Labs Lab 08/27/16 1121  LIPASE <10*   No results for input(s): AMMONIA in the last 168 hours.  CBC:  Recent Labs Lab 08/27/16 1121  WBC 16.4*  NEUTROABS 15.2*  HGB 11.2*  HCT 32.4*  MCV 82.8  PLT 266    Cardiac Enzymes:  Recent Labs Lab 08/27/16 1121 08/27/16 1522  TROPONINI 0.24* 0.38*    BNP: Invalid input(s): POCBNP  CBG:  Recent Labs Lab 08/27/16 1120 08/27/16 1604  GLUCAP 134* 142*    Microbiology: No results  found for this or any previous visit.  Coagulation Studies: No results for input(s): LABPROT, INR in the last 72 hours.  Urinalysis:  Recent Labs  08/27/16 1310  COLORURINE YELLOW*  LABSPEC 1.011  PHURINE 5.0  GLUCOSEU 50*  HGBUR LARGE*  BILIRUBINUR NEGATIVE  KETONESUR NEGATIVE  PROTEINUR 30*  NITRITE NEGATIVE  LEUKOCYTESUR NEGATIVE      Imaging: Dg Chest Port 1 View  Result Date: 08/27/2016 CLINICAL DATA:  Unresponsive. EXAM: PORTABLE CHEST 1 VIEW COMPARISON:  07/02/2016 . FINDINGS: Cardiomegaly with mild pulmonary vascular prominence. Diffuse interstitial prominence. Mild basilar atelectasis. No pleural effusion or pneumothorax. Cervical spine fusion. No acute bony abnormality . Cervical spine fusion. IMPRESSION: Congestive heart failure with bilateral pulmonary interstitial edema . Mild basilar atelectasis . Electronically Signed   By: Maisie Fushomas  Register   On: 08/27/2016 11:39     Medications:   . naLOXone (NARCAN) adult infusion for OVERDOSE 0.7 mg/hr (08/27/16 1320)   . heparin  5,000 Units Subcutaneous Q8H  . tamsulosin  0.4 mg Oral Daily   sodium chloride, acetaminophen, ondansetron (ZOFRAN) IV  Assessment/ Plan:  Mr. Brett Brett Huber is a 51 y.o. white male with hypertension, insomnia, anxiety, degenerative disc disease, history of sinus congestion, hyperlipidemia   1. Acute renal failure with hematuria and proteinuria: secondary to obstructive uropathy, NSAIDs and  ATN.  - foley placed, nonoliguric urine output. No acute indication for dialysis.  - Restart tamsulosin.   2. Hypertension: hypotensive.  - monitor, restart tamsulosin as above.  - holding olmesartan and hydrochlorothiazide.   3. Acute encepholopathy: secondary to acute renal failure with uremic encepholopathy, bladder retention, oxycodone, alprazolam and gabapentin   LOS: 0 Brett Huber 3/2/20184:54 PM

## 2016-08-27 NOTE — H&P (Signed)
PULMONARY / CRITICAL CARE MEDICINE   Name: Jettie PaganBradley T Vonstein MRN: [id number] DOB: 01/16/1966    ADMISSION DATE:  08/27/2016 CONSULTATION DATE:  08/27/2016  REFERRING MD:  Dr. Fanny BienQuale   CHIEF COMPLAINT:  Altered mental status  HISTORY OF PRESENT ILLNESS:   This is a 51 yo male with a PMH of Insomnia, HTN, Hyperlipidemia, Degenerative Disc Disease, Chronic Pain (followed by pain clinic), Anxiety, Depression, Urinary retention and hesitancy secondary to BPH with LUT's (seen by Urology 08/23/16 PSA results 1.0), and Acute on Chronic Kidney Injury (baseline creatinine 1.3 followed by Nephrology Dr. Cherylann RatelLateef outpatient).  Recently hospitalized on 07/02/16 with acute on chronic renal failure with urinary retention, dehydration, and altered mental status. During that admission he did not require dialysis his renal failure was thought to be secondary to NSAID use.  He presented to [facility] ER 03/2 with altered mental status.  According to pts wife onset of symptoms began the morning of 03/1, she stated the pt was difficult to arouse prompting current ER visit. He has chronic neck pain history, and he did take percocet and oxycodone as prescribed 2 hours prior to presentation to the ER.  He also takes scheduled xanax and trazodone at bedtime for insomnia, and on occasion he takes Goodie Powders and Ibuprofen for pain management but not often. Also, he has had a cough, nausea, and sore throat over the last 2 days and has had sick contacts at work. Upon arrival to the ER he was pulled from his car by medical staff and found to be in and out of consciousness. Therefore, he received narcan x2 and was placed on non rebreather mask becoming alert and oriented to self only.  He was also hypotensive and received 3L NS bolus and narcan gtt was initiated. Lab results revealed creatinine 7.58, BUN 60, Na+ 129, troponin 0.24, and WBC 16.4.   PCCM contacted to admit pt to ICU due to acute hypoxic respiratory failure secondary to  pulmonary edema, acute on chronic renal failure, elevated troponin's, and acute encephalopathy.    PAST MEDICAL HISTORY :  He  has a past medical history of Anxiety; Chronic pain; DDD (degenerative disc disease); Hyperlipidemia; Hypertension; Insomnia; and Sinus congestion.  PAST SURGICAL HISTORY: He  has a past surgical history that includes Cholecystectomy (2007); Cervical fusion (2004); Shoulder arthroscopy with rotator cuff repair and subacromial decompression (Left, 01/29/2013); and neck and disc replacement.  Allergies  Allergen Reactions  . Bee Venom Shortness Of Breath    No current facility-administered medications on file prior to encounter.    Current Outpatient Prescriptions on File Prior to Encounter  Medication Sig  . ALPRAZolam (XANAX) 1 MG tablet Take 1 tablet (1 mg total) by mouth at bedtime as needed for sleep.  Marland Kitchen. amLODipine (NORVASC) 5 MG tablet Take 1 tablet (5 mg total) by mouth daily.  . fluticasone (FLONASE) 50 MCG/ACT nasal spray Place 2 sprays into both nostrils daily. Use for 4-6 weeks then stop and use seasonally or as needed.  . gabapentin (NEURONTIN) 300 MG capsule Take 300 mg by mouth 4 (four) times daily.   Marland Kitchen. olmesartan-hydrochlorothiazide (BENICAR HCT) 20-12.5 MG tablet Take 1 tablet by mouth daily.  . sildenafil (REVATIO) 20 MG tablet Take 3-5 pills as needed 30 minute prior to sex.  . tamsulosin (FLOMAX) 0.4 MG CAPS capsule Take 1 capsule (0.4 mg total) by mouth daily.  . traZODone (DESYREL) 100 MG tablet Take 1 tablet (100 mg total) by mouth at bedtime.  . Cholecalciferol (VITAMIN  D3) 5000 units CAPS Take 1 capsule (5,000 Units total) by mouth daily. For 12 weeks, then start Vitamin D3 2,000 units daily (OTC) (Patient not taking: Reported on 08/23/2016)  . HYDROcodone-acetaminophen (NORCO/VICODIN) 5-325 MG tablet Take 1 tablet by mouth every 6 (six) hours as needed for moderate pain. (Patient not taking: Reported on 08/27/2016)  . hydrOXYzine (VISTARIL) 25 MG  capsule Take 1-2 capsules (25-50 mg total) by mouth at bedtime as needed (sleep). (Patient not taking: Reported on 08/23/2016)  . oxyCODONE (ROXICODONE) 15 MG immediate release tablet Take 15 mg by mouth 4 (four) times daily as needed for pain.    FAMILY HISTORY:  His indicated that his mother is deceased. He indicated that his father is alive. He indicated that the status of his neg hx is unknown.    SOCIAL HISTORY: He  reports that he has never smoked. His smokeless tobacco use includes Chew. He reports that he drinks alcohol. He reports that he does not use drugs.  REVIEW OF SYSTEMS:   Unable to assess pt lethargic with non rebreather mask in place  SUBJECTIVE:  Pt lethargic no complaints at this time  VITAL SIGNS: Pulse (!) 130   Resp (!) 30   Ht 5\' 9"  (1.753 m)   Wt 84.4 kg (186 lb)   SpO2 100%   BMI 27.47 kg/m   HEMODYNAMICS:    VENTILATOR SETTINGS:    INTAKE / OUTPUT: No intake/output data recorded.  PHYSICAL EXAMINATION: General: acutely ill appearing Caucasian male  Neuro: lethargic, following commands, PERRL HEENT: supple, no JVD Cardiovascular: NSR, s1s2, no M/R/G Lungs: faint crackles bilateral bases clear all other lobes, even, non labored  Abdomen: +BS x4, soft, non tender, non distended  Musculoskeletal: normal tone, 1+ bilateral lower extremity edema Skin: intact no rashes or lesions   LABS:  BMET  Recent Labs Lab 08/27/16 1121  NA 129*  K 4.7  CL 91*  CO2 22  BUN 60*  CREATININE 7.58*  GLUCOSE 137*    Electrolytes  Recent Labs Lab 08/27/16 1121  CALCIUM 8.3*  MG 2.5*    CBC  Recent Labs Lab 08/27/16 1121  WBC 16.4*  HGB 11.2*  HCT 32.4*  PLT 266    Coag's No results for input(s): APTT, INR in the last 168 hours.  Sepsis Markers  Recent Labs Lab 08/27/16 1125  LATICACIDVEN 1.9    ABG No results for input(s): PHART, PCO2ART, PO2ART in the last 168 hours.  Liver Enzymes  Recent Labs Lab 08/27/16 1121  AST  116*  ALT 61  ALKPHOS 35*  BILITOT 0.8  ALBUMIN 3.6    Cardiac Enzymes  Recent Labs Lab 08/27/16 1121  TROPONINI 0.24*    Glucose  Recent Labs Lab 08/27/16 1120  GLUCAP 134*    Imaging Dg Chest Port 1 View  Result Date: 08/27/2016 CLINICAL DATA:  Unresponsive. EXAM: PORTABLE CHEST 1 VIEW COMPARISON:  07/02/2016 . FINDINGS: Cardiomegaly with mild pulmonary vascular prominence. Diffuse interstitial prominence. Mild basilar atelectasis. No pleural effusion or pneumothorax. Cervical spine fusion. No acute bony abnormality . Cervical spine fusion. IMPRESSION: Congestive heart failure with bilateral pulmonary interstitial edema . Mild basilar atelectasis . Electronically Signed   By: Maisie Fus  Register   On: 08/27/2016 11:39   STUDIES:  Echo 03/2>>  CULTURES: Blood x2 03/2>> Urine 03/2>>  ANTIBIOTICS: None   SIGNIFICANT EVENTS: 03/2-Pt admitted to [facility] ICU with acute encephalopathy, acute on chronic renal failure, elevated troponin, and acute hypoxic respiratory failure   LINES/TUBES: PIV's  03/2>>  ASSESSMENT / PLAN:  PULMONARY A: Acute hypoxic respiratory failure secondary to pulmonary edema  P:   Supplemental O2 to maintain O2 sats >92% Repeat CXR in am  Prn ABG's  CARDIOVASCULAR A:  Hypotension Elevated troponin Hx: HTN and Hyperlipidemia P:  Continuous telemetry monitoring Echo pending Trend troponin's Hold outpatient norvasc and benicar Maintain map >65  RENAL A:   Acute on chronic renal failure (baseline creatinine 1.3) Hypernatremia  P:   Trend BMP's Replace electrolytes as indicated Fluid resuscitation completed in ER  Nephrology consulted will likely need hemodialysis  Reinforce with pt importance of avoiding NSAID's Avoid nephrotoxic medications Strict intake and output   GASTROINTESTINAL A:   No acute issues  P:   Will place diet order once pt more awake   HEMATOLOGIC A:   Mild Anemia without acute blood loss P:  Trend  CBC's Subq heparin for VTE prophylaxis  Monitor for s/sx of bleeding  Transfuse for hgb <7  INFECTIOUS A:   Leukocytosis  P:   Trend WBC and monitor fever curve Trend PCT's and lactic acid Follow cultures If PCT's are elevated will start empiric abx  ENDOCRINE A:   No acute issues  P:   CBG's q4hrs   NEUROLOGIC A:   Acute encephalopathy likely secondary to toxicity of pain medications due to acute on chronic renal failure vs. Uremia  P:   Avoid sedating medications  Hold outpatient gabapentin, norco, and oxycodone for now Will resume outpatient xanax dose once mentation improves    FAMILY  - Updates: Pts wife updated regarding plan of care and all questions answered 08/27/16.  She is agreeable to the pt receiving hemodialysis if Nephrology deems it necessary, the pt also reluctantly agreeable.  Sonda Rumble, AGNP  Pulmonary/Critical Care Pager [phone] (please enter 7 digits) PCCM Consult Pager [phone] (please enter 7 digits)  Pt was seen, examined, evaluated with NP, above note reflects my findings, assessment, plan. The pt appears to be in acute renal failure, he has bilateral creps and interstitial changes on my review of her CXR, c/w acute pulmonary edema with volume overload.  D/w nephro, ICU RN, NP; will plan for placement of temporary HD catheter and initiate HD in ICU. Will monitor resp status closely as pt is at high risk of intubation.   Wells Guiles, M.D.  08/27/2016   Critical Care Attestation.  I have personally obtained a history, examined the patient, evaluated laboratory and imaging results, formulated the assessment and plan and placed orders. The Patient requires high complexity decision making for assessment and support, frequent evaluation and titration of therapies, application of advanced monitoring technologies and extensive interpretation of multiple databases. The patient has critical illness that could lead imminently to failure of  1 or more organ systems and requires the highest level of physician preparedness to intervene.  Critical Care Time devoted to patient care services described in this note is 45 minutes and is exclusive of time spent in procedures.

## 2016-08-27 NOTE — ED Triage Notes (Signed)
Pulled pt from car. Pt in and out of consciousness. Friend reports was like this yesterday. Recent admission for renal failure.

## 2016-08-27 NOTE — Progress Notes (Signed)
Pharmacy Antibiotic Note  Brett Huber is a 51 y.o. male admitted on 08/27/2016 with elevated PCT.  Pharmacy has been consulted for ceftriaxone dosing.  Plan: Will initiate ceftriaxone 2g IV Q24h   Height: 5\' 9"  (175.3 cm) Weight: 186 lb (84.4 kg) IBW/kg (Calculated) : 70.7  No data recorded.   Recent Labs Lab 08/27/16 1121 08/27/16 1125 08/27/16 1648  WBC 16.4*  --   --   CREATININE 7.58*  --  5.22*  LATICACIDVEN  --  1.9  --     Estimated Creatinine Clearance: 16.6 mL/min (by C-G formula based on SCr of 5.22 mg/dL (H)).    Allergies  Allergen Reactions  . Bee Venom Shortness Of Breath    Antimicrobials this admission: CTX 3/2 >>   Microbiology results: 3/2 BCx: sent 3/2 UCx: sent 3/2 MRSA PCR: negative  Thank you for allowing pharmacy to be a part of this patient's care.  Delsa BernKelly m [place], PharmD 08/27/2016 6:05 PM

## 2016-08-28 ENCOUNTER — Inpatient Hospital Stay: Payer: Managed Care, Other (non HMO)

## 2016-08-28 ENCOUNTER — Inpatient Hospital Stay (HOSPITAL_COMMUNITY)
Admit: 2016-08-28 | Discharge: 2016-08-28 | Disposition: A | Discharge status: Home / Self Care | Payer: Managed Care, Other (non HMO) | Attending: Critical Care Medicine | Admitting: Critical Care Medicine

## 2016-08-28 DIAGNOSIS — N139 Obstructive and reflux uropathy, unspecified: Secondary | ICD-10-CM

## 2016-08-28 DIAGNOSIS — R0689 Other abnormalities of breathing: Secondary | ICD-10-CM

## 2016-08-28 LAB — CBC
HCT: 33.2 % — ABNORMAL LOW (ref 40.0–52.0)
Hemoglobin: 11.5 g/dL — ABNORMAL LOW (ref 13.0–18.0)
MCH: 28.3 pg (ref 26.0–34.0)
MCHC: 34.7 g/dL (ref 32.0–36.0)
MCV: 81.4 fL (ref 80.0–100.0)
PLATELETS: 212 10*3/uL (ref 150–440)
RBC: 4.08 MIL/uL — AB (ref 4.40–5.90)
RDW: 14.8 % — AB (ref 11.5–14.5)
WBC: 13.3 10*3/uL — ABNORMAL HIGH (ref 3.8–10.6)

## 2016-08-28 LAB — ECHOCARDIOGRAM COMPLETE
HEIGHTINCHES: 69 in
WEIGHTICAEL: 3005.31 [oz_av]

## 2016-08-28 LAB — MAGNESIUM: MAGNESIUM: 2.5 mg/dL — AB (ref 1.7–2.4)

## 2016-08-28 LAB — BASIC METABOLIC PANEL
Anion gap: 9 (ref 5–15)
BUN: 40 mg/dL — AB (ref 6–20)
CO2: 26 mmol/L (ref 22–32)
Calcium: 8 mg/dL — ABNORMAL LOW (ref 8.9–10.3)
Chloride: 99 mmol/L — ABNORMAL LOW (ref 101–111)
Creatinine, Ser: 3.33 mg/dL — ABNORMAL HIGH (ref 0.61–1.24)
GFR calc Af Amer: 23 mL/min — ABNORMAL LOW (ref >60)
GFR, EST NON AFRICAN AMERICAN: 20 mL/min — AB (ref >60)
GLUCOSE: 156 mg/dL — AB (ref 65–99)
Potassium: 3.3 mmol/L — ABNORMAL LOW (ref 3.5–5.1)
SODIUM: 134 mmol/L — AB (ref 135–145)

## 2016-08-28 LAB — URINE CULTURE: CULTURE: NO GROWTH

## 2016-08-28 LAB — PHOSPHORUS: Phosphorus: 3 mg/dL (ref 2.5–4.6)

## 2016-08-28 LAB — TROPONIN I: Troponin I: 0.34 ng/mL (ref <0.03)

## 2016-08-28 LAB — PROCALCITONIN: Procalcitonin: 3.64 ng/mL

## 2016-08-28 MED ORDER — POTASSIUM CHLORIDE IN NACL 20-0.9 MEQ/L-% IV SOLN
INTRAVENOUS | Status: DC
  Administered 2016-08-28 (×2): via INTRAVENOUS
  Filled 2016-08-28 (×4): qty 1000

## 2016-08-28 MED ORDER — PIPERACILLIN-TAZOBACTAM 3.375 G IVPB
3.3750 g | Freq: Three times a day (TID) | INTRAVENOUS | Status: DC
  Administered 2016-08-28 – 2016-08-30 (×6): 3.375 g via INTRAVENOUS
  Filled 2016-08-28 (×6): qty 50

## 2016-08-28 MED ORDER — DEXTROSE 5 % IV SOLN
2.0000 g | INTRAVENOUS | Status: DC
  Filled 2016-08-28: qty 2

## 2016-08-28 NOTE — Progress Notes (Signed)
[facility]  ROUNDING NOTE   Subjective:   More awake.  UOP 5250  NS with KCl   Objective:  Vital signs in last 24 hours:  Temp:  [99.1 F (37.3 C)-99.7 F (37.6 C)] 99.5 F (37.5 C) (03/03 0800) Pulse Rate:  [98-115] 106 (03/03 1100) Resp:  [16-26] 23 (03/03 1100) BP: (75-124)/(47-81) 124/81 (03/03 1100) SpO2:  [90 %-100 %] 91 % (03/03 1100) Weight:  [85.2 kg (187 lb 13.3 oz)] 85.2 kg (187 lb 13.3 oz) (03/03 0500)  Weight change:  Filed Weights   08/27/16 1119 08/28/16 0500  Weight: 84.4 kg (186 lb) 85.2 kg (187 lb 13.3 oz)    Intake/Output: I/O last 3 completed shifts: In: 1538.7 [P.O.:720; I.V.:768.7; IV Piggyback:50] Out: 5250 [Urine:5250]   Intake/Output this shift:  Total I/O In: 455.3 [P.O.:120; I.V.:335.3] Out: 1150 [Urine:1150]  Physical Exam: General: NAD, laying in bed  Head: Normocephalic, atraumatic. Moist oral mucosal membranes  Eyes: Anicteric, PERRL  Neck: Supple, trachea midline  Lungs:  Clear to auscultation  Heart: Regular rate and rhythm  Abdomen:  Soft, nontender,   Extremities: no peripheral edema.  Neurologic: Nonfocal, moving all four extremities  Skin: No lesions  GU: Foley with clear yellow urine    Basic Metabolic Panel:  Recent Labs Lab 08/27/16 1121 08/27/16 1648 08/28/16 0055  NA 129* 131* 134*  K 4.7 4.1 3.3*  CL 91* 100* 99*  CO2 22 22 26   GLUCOSE 137* 140* 156*  BUN 60* 49* 40*  CREATININE 7.58* 5.22* 3.33*  CALCIUM 8.3* 8.0* 8.0*  MG 2.5*  --  2.5*  PHOS  --   --  3.0    Liver Function Tests:  Recent Labs Lab 08/27/16 1121  AST 116*  ALT 61  ALKPHOS 35*  BILITOT 0.8  PROT 6.7  ALBUMIN 3.6    Recent Labs Lab 08/27/16 1121  LIPASE <10*   No results for input(s): AMMONIA in the last 168 hours.  CBC:  Recent Labs Lab 08/27/16 1121 08/28/16 0055  WBC 16.4* 13.3*  NEUTROABS 15.2*  --   HGB 11.2* 11.5*  HCT 32.4* 33.2*  MCV 82.8 81.4  PLT 266 212    Cardiac  Enzymes:  Recent Labs Lab 08/27/16 1121 08/27/16 1522 08/27/16 1920 08/28/16 0055  TROPONINI 0.24* 0.38* 0.40* 0.34*    BNP: Invalid input(s): POCBNP  CBG:  Recent Labs Lab 08/27/16 1120 08/27/16 1604  GLUCAP 134* 142*    Microbiology: Results for orders placed or performed during the hospital encounter of 08/27/16  MRSA PCR Screening     Status: None   Collection Time: 08/27/16 11:17 AM  Result Value Ref Range Status   MRSA by PCR NEGATIVE NEGATIVE Final    Comment:        The GeneXpert MRSA Assay (FDA approved for NASAL specimens only), is one component of a comprehensive MRSA colonization surveillance program. It is not intended to diagnose MRSA infection nor to guide or monitor treatment for MRSA infections.   Blood Culture (routine x 2)     Status: None (Preliminary result)   Collection Time: 08/27/16  1:10 PM  Result Value Ref Range Status   Specimen Description BLOOD RIGHT FOREARM  Final   Special Requests BOTTLES DRAWN AEROBIC AND ANAEROBIC BCAV  Final   Culture NO GROWTH < 24 HOURS  Final   Report Status PENDING  Incomplete  Blood Culture (routine x 2)     Status: None (Preliminary result)   Collection Time: 08/27/16  1:10 PM  Result Value Ref Range Status   Specimen Description BLOOD  RIGHT HAND  Final   Special Requests BOTTLES DRAWN AEROBIC AND ANAEROBIC BCAV  Final   Culture NO GROWTH < 24 HOURS  Final   Report Status PENDING  Incomplete    Coagulation Studies: No results for input(s): LABPROT, INR in the last 72 hours.  Urinalysis:  Recent Labs  08/27/16 1310  COLORURINE YELLOW*  LABSPEC 1.011  PHURINE 5.0  GLUCOSEU 50*  HGBUR LARGE*  BILIRUBINUR NEGATIVE  KETONESUR NEGATIVE  PROTEINUR 30*  NITRITE NEGATIVE  LEUKOCYTESUR NEGATIVE      Imaging: Dg Chest Port 1 View  Result Date: 08/28/2016 CLINICAL DATA:  Acute onset of respiratory failure. Initial encounter. EXAM: PORTABLE CHEST 1 VIEW COMPARISON:  Chest radiograph performed  08/27/2016 FINDINGS: The lungs are well-aerated. There has been interval redistribution of bilateral airspace opacities, worsened on the right and improved on the left, somewhat nodular in appearance. This is concerning for multifocal pneumonia. The appearance is less typical for pulmonary edema. No pleural effusion or pneumothorax is seen. The cardiomediastinal silhouette is within normal limits. No acute osseous abnormalities are seen. Cervical spinal fusion hardware is partially imaged. IMPRESSION: Interval redistribution of bilateral airspace opacities, worsened on the right and improved on the left, somewhat nodular in appearance. This is concerning for multifocal pneumonia. The appearance is less typical for pulmonary edema. Electronically Signed   By: Roanna Raider M.D.   On: 08/28/2016 04:00   Dg Chest Port 1 View  Result Date: 08/27/2016 CLINICAL DATA:  Unresponsive. EXAM: PORTABLE CHEST 1 VIEW COMPARISON:  07/02/2016 . FINDINGS: Cardiomegaly with mild pulmonary vascular prominence. Diffuse interstitial prominence. Mild basilar atelectasis. No pleural effusion or pneumothorax. Cervical spine fusion. No acute bony abnormality . Cervical spine fusion. IMPRESSION: Congestive heart failure with bilateral pulmonary interstitial edema . Mild basilar atelectasis . Electronically Signed   By: Maisie Fus  Register   On: 08/27/2016 11:39     Medications:   . 0.9 % NaCl with KCl 20 mEq / L 75 mL/hr at 08/28/16 1000   . cefTRIAXone (ROCEPHIN)  IV  2 g Intravenous Q24H  . heparin  5,000 Units Subcutaneous Q8H  . mouth rinse  15 mL Mouth Rinse BID  . tamsulosin  0.4 mg Oral Daily   sodium chloride, acetaminophen, ondansetron (ZOFRAN) IV  Assessment/ Plan:  Mr. MARQUESE BURKLAND is a 51 y.o. white male with hypertension, insomnia, anxiety, degenerative disc disease, history of sinus congestion, hyperlipidemia   1. Acute renal failure with hematuria and proteinuria: secondary to obstructive uropathy,  NSAIDs and ATN.  - foley placed, nonoliguric urine output. Renal function improving. Continue IV fluids and monitor electrolytes for post obstructive diuresis - Restarted tamsulosin.   2. Hypertension: improved.  - monitor, restart tamsulosin as above.  - holding olmesartan and hydrochlorothiazide.   3. Acute encepholopathy: secondary to acute renal failure with uremic encepholopathy, bladder retention, oxycodone, alprazolam and gabapentin   LOS: 1 Aunica Dauphinee 3/3/201811:48 AM

## 2016-08-28 NOTE — Progress Notes (Signed)
Pharmacy Antibiotic Note  Brett Huber is a 51 y.o. male with a h/o CKD, HTN, degenerative disc disease, and insomnia admitted on 08/27/2016 with AMS and admitted to the ICU for acute respiratory failure. Patient on ceftriaxone with initial CXR today more consistent with mutifocal PNA per radiiology.  Pharmacy has been consulted for Zosyn dosing to cover for possible aspiration PNA.  Plan: Zosyn 3.375g IV q8h (4 hour infusion).  Height: 5\' 9"  (175.3 cm) Weight: 187 lb 13.3 oz (85.2 kg) IBW/kg (Calculated) : 70.7  Temp (24hrs), Avg:99.3 F (37.4 C), Min:98.8 F (37.1 C), Max:99.7 F (37.6 C)   Recent Labs Lab 08/27/16 1121 08/27/16 1125 08/27/16 1648 08/28/16 0055  WBC 16.4*  --   --  13.3*  CREATININE 7.58*  --  5.22* 3.33*  LATICACIDVEN  --  1.9  --   --     Estimated Creatinine Clearance: 28.1 mL/min (by C-G formula based on SCr of 3.33 mg/dL (H)).    Allergies  Allergen Reactions  . Bee Venom Shortness Of Breath    Antimicrobials this admission: CTX 3/2 >> 3/3 Zosyn 3/3 >>   Dose adjustments this admission:   Microbiology results: 3/2 BCx: NGTD x 2 3/2 UCx: pending  3/2 MRSA PCR: negative  Thank you for allowing pharmacy to be a part of this patient's care.  Luisa HartChristy, Watson Robarge D 08/28/2016 2:42 PM

## 2016-08-28 NOTE — Progress Notes (Signed)
PULMONARY / CRITICAL CARE MEDICINE   Name: Brett Huber MRN: [id number] DOB: March 05, 1966    ADMISSION DATE:  08/27/2016   Subjective:  Pt awake, alert, no complaint this am, feeling much better.   REVIEW OF SYSTEMS:   Unable to assess pt lethargic with non rebreather mask in place   VITAL SIGNS: BP 122/75   Pulse (!) 102   Temp 99.5 F (37.5 C) (Oral)   Resp 19   Ht 5\' 9"  (1.753 m)   Wt 187 lb 13.3 oz (85.2 kg)   SpO2 95%   BMI 27.74 kg/m   HEMODYNAMICS:    VENTILATOR SETTINGS:    INTAKE / OUTPUT: I/O last 3 completed shifts: In: 1538.7 [P.O.:720; I.V.:768.7; IV Piggyback:50] Out: 5250 [Urine:5250]  PHYSICAL EXAMINATION: General: Slightly lethargic but easily arousable, Caucasian male  Neuro: lethargic, following commands, PERRL HEENT: supple, no JVD Cardiovascular: NSR, s1s2, no M/R/G Lungs: faint crackles bilateral bases clear all other lobes, even, non labored  Abdomen: +BS x4, soft, non tender, non distended  Musculoskeletal: normal tone, 1+ bilateral lower extremity edema Skin: intact no rashes or lesions   LABS:  BMET  Recent Labs Lab 08/27/16 1121 08/27/16 1648 08/28/16 0055  NA 129* 131* 134*  K 4.7 4.1 3.3*  CL 91* 100* 99*  CO2 22 22 26   BUN 60* 49* 40*  CREATININE 7.58* 5.22* 3.33*  GLUCOSE 137* 140* 156*    Electrolytes  Recent Labs Lab 08/27/16 1121 08/27/16 1648 08/28/16 0055  CALCIUM 8.3* 8.0* 8.0*  MG 2.5*  --  2.5*  PHOS  --   --  3.0    CBC  Recent Labs Lab 08/27/16 1121 08/28/16 0055  WBC 16.4* 13.3*  HGB 11.2* 11.5*  HCT 32.4* 33.2*  PLT 266 212    Coag's No results for input(s): APTT, INR in the last 168 hours.  Sepsis Markers  Recent Labs Lab 08/27/16 1121 08/27/16 1125 08/28/16 0055  LATICACIDVEN  --  1.9  --   PROCALCITON 1.62  --  3.64    ABG No results for input(s): PHART, PCO2ART, PO2ART in the last 168 hours.  Liver Enzymes  Recent Labs Lab 08/27/16 1121  AST 116*  ALT 61   ALKPHOS 35*  BILITOT 0.8  ALBUMIN 3.6    Cardiac Enzymes  Recent Labs Lab 08/27/16 1522 08/27/16 1920 08/28/16 0055  TROPONINI 0.38* 0.40* 0.34*    Glucose  Recent Labs Lab 08/27/16 1120 08/27/16 1604  GLUCAP 134* 142*    Imaging Dg Chest Port 1 View  Result Date: 08/28/2016 CLINICAL DATA:  Acute onset of respiratory failure. Initial encounter. EXAM: PORTABLE CHEST 1 VIEW COMPARISON:  Chest radiograph performed 08/27/2016 FINDINGS: The lungs are well-aerated. There has been interval redistribution of bilateral airspace opacities, worsened on the right and improved on the left, somewhat nodular in appearance. This is concerning for multifocal pneumonia. The appearance is less typical for pulmonary edema. No pleural effusion or pneumothorax is seen. The cardiomediastinal silhouette is within normal limits. No acute osseous abnormalities are seen. Cervical spinal fusion hardware is partially imaged. IMPRESSION: Interval redistribution of bilateral airspace opacities, worsened on the right and improved on the left, somewhat nodular in appearance. This is concerning for multifocal pneumonia. The appearance is less typical for pulmonary edema. Electronically Signed   By: Roanna Raider M.D.   On: 08/28/2016 04:00   Dg Chest Port 1 View  Result Date: 08/27/2016 CLINICAL DATA:  Unresponsive. EXAM: PORTABLE CHEST 1 VIEW COMPARISON:  07/02/2016 .  FINDINGS: Cardiomegaly with mild pulmonary vascular prominence. Diffuse interstitial prominence. Mild basilar atelectasis. No pleural effusion or pneumothorax. Cervical spine fusion. No acute bony abnormality . Cervical spine fusion. IMPRESSION: Congestive heart failure with bilateral pulmonary interstitial edema . Mild basilar atelectasis . Electronically Signed   By: Maisie Fushomas  Register   On: 08/27/2016 11:39   STUDIES:  Echo 03/2>>  CULTURES: Blood x2 03/2>>Negative Urine 03/2>> 3/2>> MRSA PCR negative  ANTIBIOTICS: None   SIGNIFICANT  EVENTS: 03/2-[facility] with acute encephalopathy, acute on chronic renal failure, elevated troponin, and acute hypoxic respiratory failure   LINES/TUBES: PIV's 03/2>>  ASSESSMENT / PLAN:  PULMONARY A: Acute hypoxic respiratory failure secondary to pulmonary edema -improved P:   Supplemental O2 to maintain O2 sats >92% Repeat CXR in am    CARDIOVASCULAR A:  Hypotension-improved Elevated troponin Hx: HTN and Hyperlipidemia P:  Continuous telemetry monitoring Echo pending Trend troponin's Hold outpatient norvasc and benicar Maintain map >65  RENAL A:   Acute on chronic renal failure (baseline creatinine 1.3) Hypernatremia  Likely obstructive uropathy, patient is doing better with good urine production after placement of a Foley catheter. Creatinine is coming down. Good urine output.  P:   Trend BMP's Replace electrolytes as indicated Fluid resuscitation completed in ER  Reinforce with pt importance of avoiding NSAID's Avoid nephrotoxic medications Strict intake and output   GASTROINTESTINAL A:   No acute issues  P:   Will place diet order once pt more awake   HEMATOLOGIC A:   Mild Anemia without acute blood loss P:  Trend CBC's Subq heparin for VTE prophylaxis  Monitor for s/sx of bleeding  Transfuse for hgb <7  INFECTIOUS A:   Leukocytosis  P:   Trend WBC and monitor fever curve Trend PCT's and lactic acid Follow cultures If PCT's are elevated will start empiric abx  ENDOCRINE A:   No acute issues  P:   CBG's q4hrs   NEUROLOGIC A:   Acute encephalopathy likely secondary to toxicity of pain medications due to acute on chronic renal failure vs. Uremia  P:   -Improved with Narcan drip, will DC drip and monitor.    Brett Huber, M.D.  08/28/2016   Critical Care Attestation.  I have personally obtained a history, examined the patient, evaluated laboratory and imaging results, formulated the assessment and plan and placed  orders. The Patient requires high complexity decision making for assessment and support, frequent evaluation and titration of therapies, application of advanced monitoring technologies and extensive interpretation of multiple databases. The patient has critical illness that could lead imminently to failure of 1 or more organ systems and requires the highest level of physician preparedness to intervene.  Critical Care Time devoted to patient care services described in this note is 45 minutes and is exclusive of time spent in procedures.

## 2016-08-28 NOTE — Progress Notes (Signed)
Pt weaned off O2, sats 92% on room air, [facility], narcan drip off since 1100, [facility] pt transported to Rm 204 in wheelchair with his chart and belongings, CN on 2C informed he was there, his fluids were hung to new pump before writer left room.

## 2016-08-28 NOTE — Progress Notes (Signed)
Use lot of pain meds, Came with AMS and Uremia, ARF, required Bicarb drip, but no HD, improving. Have some urinary retention.  Will take over from tomorrow.

## 2016-08-28 NOTE — Progress Notes (Signed)
Per Dr ram Dc narcan drip and watch patient for about three hours before sending him to the floor

## 2016-08-29 DIAGNOSIS — R338 Other retention of urine: Secondary | ICD-10-CM

## 2016-08-29 DIAGNOSIS — N401 Enlarged prostate with lower urinary tract symptoms: Secondary | ICD-10-CM

## 2016-08-29 DIAGNOSIS — N179 Acute kidney failure, unspecified: Secondary | ICD-10-CM

## 2016-08-29 DIAGNOSIS — N138 Other obstructive and reflux uropathy: Secondary | ICD-10-CM

## 2016-08-29 LAB — CBC
HCT: 33.6 % — ABNORMAL LOW (ref 40.0–52.0)
HEMOGLOBIN: 11.8 g/dL — AB (ref 13.0–18.0)
MCH: 28.9 pg (ref 26.0–34.0)
MCHC: 35.1 g/dL (ref 32.0–36.0)
MCV: 82.4 fL (ref 80.0–100.0)
PLATELETS: 189 10*3/uL (ref 150–440)
RBC: 4.07 MIL/uL — ABNORMAL LOW (ref 4.40–5.90)
RDW: 14.6 % — AB (ref 11.5–14.5)
WBC: 6 10*3/uL (ref 3.8–10.6)

## 2016-08-29 LAB — BASIC METABOLIC PANEL
Anion gap: 5 (ref 5–15)
BUN: 23 mg/dL — AB (ref 6–20)
CALCIUM: 8.1 mg/dL — AB (ref 8.9–10.3)
CHLORIDE: 101 mmol/L (ref 101–111)
CO2: 30 mmol/L (ref 22–32)
CREATININE: 1.66 mg/dL — AB (ref 0.61–1.24)
GFR, EST AFRICAN AMERICAN: 53 mL/min — AB (ref >60)
GFR, EST NON AFRICAN AMERICAN: 46 mL/min — AB (ref >60)
Glucose, Bld: 103 mg/dL — ABNORMAL HIGH (ref 65–99)
Potassium: 3.8 mmol/L (ref 3.5–5.1)
SODIUM: 136 mmol/L (ref 135–145)

## 2016-08-29 LAB — MAGNESIUM: MAGNESIUM: 2.2 mg/dL (ref 1.7–2.4)

## 2016-08-29 LAB — PROCALCITONIN: PROCALCITONIN: 1.85 ng/mL

## 2016-08-29 NOTE — Consult Note (Signed)
Subjective: I was asked to see Brett Huber by Dr. Winona Legato for recurrent urinary retention with ARI.   He was admitted on 3/2 with ARF and a metabolic encephalopathy and had a PVR of by bladder scan in the ER but records post foley placement.   He had a similar episode in early January with a Cr of 8 and a PVR of .   He has been on tamsulosin and was seen at [facility] on 08/23/16.  His PVR there was and he was voiding without significant complaints.   He reports that he began to have some low back pain and reduced urine output on Weds of last week.  He denied bladder pain or other voiding complaints.  He is currently doing well with the foley and his Cr has returned to 1.66 from 7.58 on admission.  It was 1.73 on 2/7.  He has chronic pain and is on opioids and gabapentin but actually was in the process of reducing the dose prior to this episode.   ROS:  Review of Systems  All other systems reviewed and are negative.   Allergies  Allergen Reactions  . Bee Venom Shortness Of Breath    Past Medical History:  Diagnosis Date  . Anxiety   . Chronic pain   . DDD (degenerative disc disease)   . Hyperlipidemia   . Hypertension   . Insomnia   . Sinus congestion     Past Surgical History:  Procedure Laterality Date  . CERVICAL FUSION  2004  . CHOLECYSTECTOMY  2007  . neck and disc replacement    . SHOULDER ARTHROSCOPY WITH ROTATOR CUFF REPAIR AND SUBACROMIAL DECOMPRESSION Left 01/29/2013   Procedure: LEFT SHOULDER ARTHROSCOPY WITH ROTATOR CUFF REPAIR AND SUBACROMIAL DECOMPRESSION DISTAL CLAVICLE RESECTION;  Surgeon: Mable Paris, MD;  Location: [facility];  Service: Orthopedics;  Laterality: Left;    Social History   Social History  . Marital status: Married    Spouse name: N/A  . Number of children: N/A  . Years of education: N/A   Occupational History  . Not on file.   Social History Main Topics  . Smoking status:  Never Smoker  . Smokeless tobacco: Current User    Types: Chew  . Alcohol use Yes     Comment: occ  . Drug use: No  . Sexual activity: Not on file   Other Topics Concern  . Not on file   Social History Narrative  . No narrative on file    Family History  Problem Relation Age of Onset  . Diabetes Mother   . Stroke Father   . Prostate cancer Neg Hx   . Kidney cancer Neg Hx   . Bladder Cancer Neg Hx     Anti-infectives: Anti-infectives    Start     Dose/Rate Route Frequency Ordered Stop   08/28/16 2200  cefTRIAXone (ROCEPHIN) 2 g in dextrose 5 % 50 mL IVPB  Status:  Discontinued     2 g 100 mL/hr over 30 Minutes Intravenous Every 24 hours 08/28/16 0758 08/28/16 1419   08/28/16 1800  cefTRIAXone (ROCEPHIN) 2 g in dextrose 5 % 50 mL IVPB  Status:  Discontinued     2 g 100 mL/hr over 30 Minutes Intravenous Every 24 hours 08/28/16 1419 08/28/16 1441   08/28/16 1600  piperacillin-tazobactam (ZOSYN) IVPB 3.375 g     3.375 g 12.5 mL/hr over 240 Minutes Intravenous Every 8 hours 08/28/16 1442  08/27/16 2200  cefTRIAXone (ROCEPHIN) IVPB 2 g  Status:  Discontinued     2 g 100 mL/hr over 30 Minutes Intravenous Every 24 hours 08/27/16 2045 08/28/16 0758   08/27/16 2000  cefTRIAXone (ROCEPHIN) IVPB 2 g  Status:  Discontinued     2 g 100 mL/hr over 30 Minutes Intravenous Every 24 hours 08/27/16 1804 08/27/16 2045      Current Facility-Administered Medications  Medication Dose Route Frequency Provider Last Rate Last Dose  . 0.9 %  sodium chloride infusion  250 mL Intravenous PRN Eugenie Norrie, NP   Stopped at 08/28/16 1200  . acetaminophen (TYLENOL) tablet 650 mg  650 mg Oral Q4H PRN Eugenie Norrie, NP      . heparin injection 5,000 Units  5,000 Units Subcutaneous Q8H Eugenie Norrie, NP   5,000 Units at 08/29/16 0551  . MEDLINE mouth rinse  15 mL Mouth Rinse BID Lewie Loron, NP   15 mL at 08/29/16 0830  . ondansetron (ZOFRAN) injection 4 mg  4 mg Intravenous Q6H PRN  Eugenie Norrie, NP      . piperacillin-tazobactam (ZOSYN) IVPB 3.375 g  3.375 g Intravenous Q8H Valentina Gu, RPH   3.375 g at 08/29/16 0551  . tamsulosin (FLOMAX) capsule 0.4 mg  0.4 mg Oral Daily Eugenie Norrie, NP   0.4 mg at 08/29/16 0841   Home and hospital meds reviewed.    Objective: Vital signs in last 24 hours: Temp:  [98.2 F (36.8 C)-98.9 F (37.2 C)] 98.3 F (36.8 C) (03/04 0501) Pulse Rate:  [79-106] 79 (03/04 0501) Resp:  [16-23] 16 (03/04 0501) BP: (106-126)/(67-81) 116/72 (03/04 0501) SpO2:  [91 %-95 %] 93 % (03/04 0501) Weight:  [86.4 kg (190 lb 7.6 oz)] 86.4 kg (190 lb 7.6 oz) (03/04 0501)  Intake/Output from previous day: 03/03 0701 - 03/04 0700 In: 2497.3 [P.O.:600; I.V.:1397.3; IV Piggyback:50] Out: 2475 [Urine:2475] Intake/Output this shift: Total I/O In: 700 [I.V.:600; IV Piggyback:100] Out: -    Physical Exam  Constitutional: He is oriented to person, place, and time and well-developed, well-nourished, and in no distress.  HENT:  Head: Normocephalic and atraumatic.  Cardiovascular: Normal rate, regular rhythm and normal heart sounds.   Pulmonary/Chest: Effort normal and breath sounds normal. No respiratory distress.  Abdominal: Soft. He exhibits no mass. There is no tenderness.  Genitourinary:  Genitourinary Comments: Foley draining clear urine.      Normal GU exam in the office on 2/26.  Prostate was about 45gm and benign per note.   Musculoskeletal: Normal range of motion. He exhibits no edema.  Neurological: He is alert and oriented to person, place, and time.  Skin: Skin is warm and dry.  Psychiatric: Mood and affect normal.    Lab Results:   Recent Labs  08/28/16 0055 08/29/16 0310  WBC 13.3* 6.0  HGB 11.5* 11.8*  HCT 33.2* 33.6*  PLT 212 189   BMET  Recent Labs  08/28/16 0055 08/29/16 0310  NA 134* 136  K 3.3* 3.8  CL 99* 101  CO2 26 30  GLUCOSE 156* 103*  BUN 40* 23*  CREATININE 3.33* 1.66*  CALCIUM 8.0* 8.1*    PT/INR No results for input(s): LABPROT, INR in the last 72 hours. ABG  Recent Labs  08/27/16 1126  HCO3 23.9    Studies/Results: Dg Chest Port 1 View  Result Date: 08/28/2016 CLINICAL DATA:  Acute onset of respiratory failure. Initial encounter. EXAM: PORTABLE CHEST 1 VIEW COMPARISON:  Chest radiograph performed 08/27/2016 FINDINGS: The lungs are well-aerated. There has been interval redistribution of bilateral airspace opacities, worsened on the right and improved on the left, somewhat nodular in appearance. This is concerning for multifocal pneumonia. The appearance is less typical for pulmonary edema. No pleural effusion or pneumothorax is seen. The cardiomediastinal silhouette is within normal limits. No acute osseous abnormalities are seen. Cervical spinal fusion hardware is partially imaged. IMPRESSION: Interval redistribution of bilateral airspace opacities, worsened on the right and improved on the left, somewhat nodular in appearance. This is concerning for multifocal pneumonia. The appearance is less typical for pulmonary edema. Electronically Signed   By: Roanna RaiderJeffery  Chang M.D.   On: 08/28/2016 04:00   Dg Chest Port 1 View  Result Date: 08/27/2016 CLINICAL DATA:  Unresponsive. EXAM: PORTABLE CHEST 1 VIEW COMPARISON:  07/02/2016 . FINDINGS: Cardiomegaly with mild pulmonary vascular prominence. Diffuse interstitial prominence. Mild basilar atelectasis. No pleural effusion or pneumothorax. Cervical spine fusion. No acute bony abnormality . Cervical spine fusion. IMPRESSION: Congestive heart failure with bilateral pulmonary interstitial edema . Mild basilar atelectasis . Electronically Signed   By: Maisie Fushomas  Register   On: 08/27/2016 11:39   ER and Hospitalist notes reviewed.   [facility].  His PSA was 1.0.    Labs reviewed.    Assessment: 1.Recurrent urinary retention secondary to BPH with BOO.    2. Metabolic encephalopathy with acute on chronic renal  failure from obstructive uropathy improving with foley drainage.   Rec: 1. Continue foley drainage and can be discharged with a leg bag when cleared medically. 2. Since this is his second episode of retention despite tamsulosin, he is probably going to need a TURP or similar procedure for relief of obstruction.   I will notify [facility] to arrange f/u if he is to be discharged  Today.    CC: Dr. Katharina CaperIMA VAICKUTE     Bjorn PippinWRENN,Tishanna Dunford J 08/29/2016 [phone]

## 2016-08-29 NOTE — Plan of Care (Signed)
Problem: Safety: Goal: Ability to remain free from injury will improve Outcome: Progressing Educated patient on the need to call for assistance before taking a shower

## 2016-08-29 NOTE — Progress Notes (Signed)
[facility]  ROUNDING NOTE   Subjective:   UOP 2475  Creatinine 1.66 (3.33)  Objective:  Vital signs in last 24 hours:  Temp:  [98.2 F (36.8 C)-98.9 F (37.2 C)] 98.3 F (36.8 C) (03/04 0501) Pulse Rate:  [79-106] 79 (03/04 0501) Resp:  [16-23] 16 (03/04 0501) BP: (114-126)/(68-81) 116/72 (03/04 0501) SpO2:  [91 %-95 %] 93 % (03/04 0501) Weight:  [86.4 kg (190 lb 7.6 oz)] 86.4 kg (190 lb 7.6 oz) (03/04 0501)  Weight change: 2.031 kg (4 lb 7.6 oz) Filed Weights   08/27/16 1119 08/28/16 0500 08/29/16 0501  Weight: 84.4 kg (186 lb) 85.2 kg (187 lb 13.3 oz) 86.4 kg (190 lb 7.6 oz)    Intake/Output: I/O last 3 completed shifts: In: 3787.8 [P.O.:1320; I.V.:1917.8; Other:450; IV Piggyback:100] Out: 5825 [Urine:5825]   Intake/Output this shift:  Total I/O In: 700 [I.V.:600; IV Piggyback:100] Out: -   Physical Exam: General: NAD, laying in bed  Head: Normocephalic, atraumatic. Moist oral mucosal membranes  Eyes: Anicteric, PERRL  Neck: Supple, trachea midline  Lungs:  Clear to auscultation  Heart: Regular rate and rhythm  Abdomen:  Soft, nontender,   Extremities: no peripheral edema.  Neurologic: Nonfocal, moving all four extremities  Skin: No lesions  GU: Foley with clear yellow urine    Basic Metabolic Panel:  Recent Labs Lab 08/27/16 1121 08/27/16 1648 08/28/16 0055 08/29/16 0310  NA 129* 131* 134* 136  K 4.7 4.1 3.3* 3.8  CL 91* 100* 99* 101  CO2 22 22 26 30   GLUCOSE 137* 140* 156* 103*  BUN 60* 49* 40* 23*  CREATININE 7.58* 5.22* 3.33* 1.66*  CALCIUM 8.3* 8.0* 8.0* 8.1*  MG 2.5*  --  2.5* 2.2  PHOS  --   --  3.0  --     Liver Function Tests:  Recent Labs Lab 08/27/16 1121  AST 116*  ALT 61  ALKPHOS 35*  BILITOT 0.8  PROT 6.7  ALBUMIN 3.6    Recent Labs Lab 08/27/16 1121  LIPASE <10*   No results for input(s): AMMONIA in the last 168 hours.  CBC:  Recent Labs Lab 08/27/16 1121 08/28/16 0055 08/29/16 0310  WBC  16.4* 13.3* 6.0  NEUTROABS 15.2*  --   --   HGB 11.2* 11.5* 11.8*  HCT 32.4* 33.2* 33.6*  MCV 82.8 81.4 82.4  PLT 266 212 189    Cardiac Enzymes:  Recent Labs Lab 08/27/16 1121 08/27/16 1522 08/27/16 1920 08/28/16 0055  TROPONINI 0.24* 0.38* 0.40* 0.34*    BNP: Invalid input(s): POCBNP  CBG:  Recent Labs Lab 08/27/16 1120 08/27/16 1604  GLUCAP 134* 142*    Microbiology: Results for orders placed or performed during the hospital encounter of 08/27/16  MRSA PCR Screening     Status: None   Collection Time: 08/27/16 11:17 AM  Result Value Ref Range Status   MRSA by PCR NEGATIVE NEGATIVE Final    Comment:        The GeneXpert MRSA Assay (FDA approved for NASAL specimens only), is one component of a comprehensive MRSA colonization surveillance program. It is not intended to diagnose MRSA infection nor to guide or monitor treatment for MRSA infections.   Blood Culture (routine x 2)     Status: None (Preliminary result)   Collection Time: 08/27/16  1:10 PM  Result Value Ref Range Status   Specimen Description BLOOD RIGHT FOREARM  Final   Special Requests BOTTLES DRAWN AEROBIC AND ANAEROBIC BCAV  Final  Culture NO GROWTH 2 DAYS  Final   Report Status PENDING  Incomplete  Blood Culture (routine x 2)     Status: None (Preliminary result)   Collection Time: 08/27/16  1:10 PM  Result Value Ref Range Status   Specimen Description BLOOD  RIGHT HAND  Final   Special Requests BOTTLES DRAWN AEROBIC AND ANAEROBIC BCAV  Final   Culture NO GROWTH 2 DAYS  Final   Report Status PENDING  Incomplete  Urine culture     Status: None   Collection Time: 08/27/16  1:14 PM  Result Value Ref Range Status   Specimen Description URINE, RANDOM  Final   Special Requests NONE  Final   Culture   Final    NO GROWTH Performed at [facility], [address] [place], [place] [address]    Report Status 08/28/2016 FINAL  Final    Coagulation Studies: No results for input(s):  LABPROT, INR in the last 72 hours.  Urinalysis:  Recent Labs  08/27/16 1310  COLORURINE YELLOW*  LABSPEC 1.011  PHURINE 5.0  GLUCOSEU 50*  HGBUR LARGE*  BILIRUBINUR NEGATIVE  KETONESUR NEGATIVE  PROTEINUR 30*  NITRITE NEGATIVE  LEUKOCYTESUR NEGATIVE      Imaging: Dg Chest Port 1 View  Result Date: 08/28/2016 CLINICAL DATA:  Acute onset of respiratory failure. Initial encounter. EXAM: PORTABLE CHEST 1 VIEW COMPARISON:  Chest radiograph performed 08/27/2016 FINDINGS: The lungs are well-aerated. There has been interval redistribution of bilateral airspace opacities, worsened on the right and improved on the left, somewhat nodular in appearance. This is concerning for multifocal pneumonia. The appearance is less typical for pulmonary edema. No pleural effusion or pneumothorax is seen. The cardiomediastinal silhouette is within normal limits. No acute osseous abnormalities are seen. Cervical spinal fusion hardware is partially imaged. IMPRESSION: Interval redistribution of bilateral airspace opacities, worsened on the right and improved on the left, somewhat nodular in appearance. This is concerning for multifocal pneumonia. The appearance is less typical for pulmonary edema. Electronically Signed   By: Roanna RaiderJeffery  Chang M.D.   On: 08/28/2016 04:00   Dg Chest Port 1 View  Result Date: 08/27/2016 CLINICAL DATA:  Unresponsive. EXAM: PORTABLE CHEST 1 VIEW COMPARISON:  07/02/2016 . FINDINGS: Cardiomegaly with mild pulmonary vascular prominence. Diffuse interstitial prominence. Mild basilar atelectasis. No pleural effusion or pneumothorax. Cervical spine fusion. No acute bony abnormality . Cervical spine fusion. IMPRESSION: Congestive heart failure with bilateral pulmonary interstitial edema . Mild basilar atelectasis . Electronically Signed   By: Maisie Fushomas  Register   On: 08/27/2016 11:39     Medications:    . heparin  5,000 Units Subcutaneous Q8H  . mouth rinse  15 mL Mouth Rinse BID  .  piperacillin-tazobactam (ZOSYN)  IV  3.375 g Intravenous Q8H  . tamsulosin  0.4 mg Oral Daily   sodium chloride, acetaminophen, ondansetron (ZOFRAN) IV  Assessment/ Plan:  Brett Huber is a 51 y.o. white male with hypertension, insomnia, anxiety, degenerative disc disease, history of sinus congestion, hyperlipidemia   1. Acute renal failure with hematuria and proteinuria: secondary to obstructive uropathy. Baseline creatinine 1.26, 08/23/16.  - foley placed, nonoliguric urine output. Renal function improving.  - Discontinue IV fluids - Restarted tamsulosin.  - Appreciate urology input.   2. Hypertension: improved.  - monitor, restarted tamsulosin as above.  - holding olmesartan and hydrochlorothiazide.    3. Acute encepholopathy: secondary to acute renal failure with uremic encepholopathy, bladder retention, oxycodone, alprazolam and gabapentin. Required naloxone gtt.  LOS: 2 Vyncent Overby 3/4/20189:04 AM

## 2016-08-29 NOTE — Progress Notes (Signed)
[facility] [place] [facility]   PATIENT NAME: Brett LenzBradley Huber    MR#:  [id number]  DATE OF BIRTH:  07/05/1965  SUBJECTIVE:  CHIEF COMPLAINT:   Chief Complaint  Patient presents with  . 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 He was given Narcan 2, was placed on nonrebreather became alert and was admitted. Labs in the hospital revealed creatinine level of 7.58, patient was given IV fluids and his kidney function normalized after Foley catheter placement by urology. Urinalysis was unremarkable, urine drug screen was positive for opiates and benzodiazepines. Chest x-ray was concerning for CHF initially, multifocal pneumonia on repeated test. The patient feels better today.      Review of Systems  Constitutional: Negative for chills, fever and weight loss.  HENT: Negative for congestion.   Eyes: Negative for blurred vision and double vision.  Respiratory: Positive for cough and sputum production. Negative for shortness of breath and wheezing.   Cardiovascular: Negative for chest pain, palpitations, orthopnea, leg swelling and PND.  Gastrointestinal: Negative for abdominal pain, blood in stool, constipation, diarrhea, nausea and vomiting.  Genitourinary: Negative for dysuria, frequency, hematuria and urgency.  Musculoskeletal: Negative for falls.  Neurological: Negative for dizziness, tremors, focal weakness and headaches.  Endo/Heme/Allergies: Does not bruise/bleed easily.  Psychiatric/Behavioral: Negative for depression. The patient does not have insomnia.     VITAL SIGNS: Blood pressure 116/72, pulse 79,  temperature 98.3 F (36.8 C), temperature source Oral, resp. rate 16, height 5\' 9"  (1.753 m), weight 86.4 kg (190 lb 7.6 oz), SpO2 93 %.  PHYSICAL EXAMINATION:   GENERAL:  51 y.o.-year-old patient lying in the bed with no acute distress.  EYES: Pupils equal, round, reactive to light and accommodation. No scleral icterus. Extraocular muscles intact.  HEENT: Head atraumatic, normocephalic. Oropharynx and nasopharynx clear.  NECK:  Supple, no jugular venous distention. No thyroid enlargement, no tenderness.  LUNGS: Normal breath sounds bilaterally, no wheezing, rales,rhonchi , but scattered localized posterior lung aspect crepitations. No use of accessory muscles of respiration.  CARDIOVASCULAR: S1, S2 normal. No murmurs, rubs, or gallops.  ABDOMEN: Soft, nontender, nondistended. Bowel sounds present. No organomegaly or mass.  EXTREMITIES: No pedal edema, cyanosis, or clubbing.  NEUROLOGIC: Cranial nerves II through XII are intact. Muscle strength 5/5 in all extremities. Sensation intact. Gait not checked.  PSYCHIATRIC: The patient is alert and oriented x 3.  SKIN: No obvious rash, lesion, or ulcer.   ORDERS/RESULTS REVIEWED:   CBC  Recent Labs Lab 08/27/16 1121 08/28/16 0055 08/29/16 0310  WBC 16.4* 13.3* 6.0  HGB 11.2* 11.5* 11.8*  HCT 32.4* 33.2* 33.6*  PLT 266 212 189  MCV 82.8 81.4 82.4  MCH 28.5 28.3 28.9  MCHC 34.5 34.7 35.1  RDW 14.7* 14.8* 14.6*  LYMPHSABS 0.2*  --   --   MONOABS 0.9  --   --   EOSABS 0.0  --   --   BASOSABS 0.0  --   --    ------------------------------------------------------------------------------------------------------------------  Chemistries   Recent Labs Lab 08/27/16 1121 08/27/16 1648 08/28/16 0055 08/29/16 0310  NA 129* 131* 134* 136  K 4.7 4.1 3.3* 3.8  CL 91* 100* 99* 101  CO2 22 22  26 30  GLUCOSE 137* 140* 156* 103*  BUN 60* 49* 40* 23*  CREATININE 7.58* 5.22* 3.33* 1.66*  CALCIUM 8.3* 8.0* 8.0* 8.1*  MG 2.5*  --  2.5* 2.2   AST 116*  --   --   --   ALT 61  --   --   --   ALKPHOS 35*  --   --   --   BILITOT 0.8  --   --   --    ------------------------------------------------------------------------------------------------------------------ estimated creatinine clearance is 56.7 mL/min (by C-G formula based on SCr of 1.66 mg/dL (H)). ------------------------------------------------------------------------------------------------------------------ No results for input(s): TSH, T4TOTAL, T3FREE, THYROIDAB in the last 72 hours.  Invalid input(s): FREET3  Cardiac Enzymes  Recent Labs Lab 08/27/16 1522 08/27/16 1920 08/28/16 0055  TROPONINI 0.38* 0.40* 0.34*   ------------------------------------------------------------------------------------------------------------------ Invalid input(s): POCBNP ---------------------------------------------------------------------------------------------------------------  RADIOLOGY: Dg Chest Port 1 View  Result Date: 08/28/2016 CLINICAL DATA:  Acute onset of respiratory failure. Initial encounter. EXAM: PORTABLE CHEST 1 VIEW COMPARISON:  Chest radiograph performed 08/27/2016 FINDINGS: The lungs are well-aerated. There has been interval redistribution of bilateral airspace opacities, worsened on the right and improved on the left, somewhat nodular in appearance. This is concerning for multifocal pneumonia. The appearance is less typical for pulmonary edema. No pleural effusion or pneumothorax is seen. The cardiomediastinal silhouette is within normal limits. No acute osseous abnormalities are seen. Cervical spinal fusion hardware is partially imaged. IMPRESSION: Interval redistribution of bilateral airspace opacities, worsened on the right and improved on the left, somewhat nodular in appearance. This is concerning for multifocal pneumonia. The appearance is less typical for pulmonary edema. Electronically Signed   By: Roanna Raider M.D.   On: 08/28/2016 04:00    EKG:   Orders placed or performed during the hospital encounter of 08/27/16  . ED EKG  . ED EKG  . EKG 12-Lead  . EKG 12-Lead  . EKG 12-Lead  . EKG 12-Lead  . EKG 12-Lead  . EKG 12-Lead  . ED EKG  . ED EKG  . EKG 12-Lead  . EKG 12-Lead  . EKG    ASSESSMENT AND PLAN:  Active Problems:   Acute on chronic renal failure (HCC)  #1. Acute on chronic renal failure, likely postobstructive, status post Foley catheter placement, improved creatinine, follow creatinine in the morning, nephrologist is following as well as urologist, appreciate input. Needs TURP, per urology, to be done by [facility] urology #2: Pneumonia, continue patient on Zosyn, get sputum cultures if possible #3. Leukocytosis, improved with antibiotic therapy #4. Anemia, stable #5. Metabolic encephalopathy, improved, follow clinically #6, hyperglycemia, get hemoglobin A1c #7. Elevated troponin, negative demand ischemia, echocardiogram revealed an ejection fraction of 45-50%, grade 1 diastolic dysfunction, elevated pulmonary pressure, now off IV fluids  Management plans discussed with the patient, family and they are in agreement.   DRUG ALLERGIES:  Allergies  Allergen Reactions  . Bee Venom Shortness Of Breath    CODE STATUS:     Code Status Orders        Start     Ordered   08/27/16 1314  Full code  Continuous     08/27/16 1316    Code Status History    Date Active Date Inactive Code Status Order ID Comments User Context   07/02/2016  6:11 PM 07/05/2016  6:57 PM Full Code [id number]  Katha Hamming, MD ED      TOTAL TIME TAKING CARE OF THIS PATIENT: 40 minutes.  Discussed with urologist and nephrologist  Katharina Caper M.D on 08/29/2016 at 1:10 PM  Between 7am to 6pm - Pager - [phone]  After 6pm go to [email or web address] - password [email or web address] [facility]  [place] [facility]  Office  [phone]  CC: Primary care physician; Saralyn Pilar, DO

## 2016-08-30 ENCOUNTER — Ambulatory Visit: Payer: Managed Care, Other (non HMO) | Admitting: Family Medicine

## 2016-08-30 ENCOUNTER — Telehealth: Payer: Self-pay

## 2016-08-30 DIAGNOSIS — D72829 Elevated white blood cell count, unspecified: Secondary | ICD-10-CM

## 2016-08-30 DIAGNOSIS — R809 Proteinuria, unspecified: Secondary | ICD-10-CM

## 2016-08-30 DIAGNOSIS — G9341 Metabolic encephalopathy: Secondary | ICD-10-CM

## 2016-08-30 DIAGNOSIS — N400 Enlarged prostate without lower urinary tract symptoms: Secondary | ICD-10-CM

## 2016-08-30 DIAGNOSIS — R319 Hematuria, unspecified: Secondary | ICD-10-CM

## 2016-08-30 DIAGNOSIS — I248 Other forms of acute ischemic heart disease: Secondary | ICD-10-CM

## 2016-08-30 DIAGNOSIS — N320 Bladder-neck obstruction: Secondary | ICD-10-CM

## 2016-08-30 DIAGNOSIS — J159 Unspecified bacterial pneumonia: Secondary | ICD-10-CM

## 2016-08-30 DIAGNOSIS — I429 Cardiomyopathy, unspecified: Secondary | ICD-10-CM

## 2016-08-30 LAB — HEMOGLOBIN A1C
HEMOGLOBIN A1C: 5.7 % — AB (ref 4.8–5.6)
MEAN PLASMA GLUCOSE: 117 mg/dL

## 2016-08-30 LAB — BASIC METABOLIC PANEL
ANION GAP: 6 (ref 5–15)
BUN: 16 mg/dL (ref 6–20)
CALCIUM: 8.6 mg/dL — AB (ref 8.9–10.3)
CO2: 29 mmol/L (ref 22–32)
Chloride: 103 mmol/L (ref 101–111)
Creatinine, Ser: 1.25 mg/dL — ABNORMAL HIGH (ref 0.61–1.24)
GFR calc Af Amer: >60 mL/min (ref >60)
GLUCOSE: 98 mg/dL (ref 65–99)
POTASSIUM: 3.7 mmol/L (ref 3.5–5.1)
SODIUM: 138 mmol/L (ref 135–145)

## 2016-08-30 MED ORDER — LEVOFLOXACIN 500 MG PO TABS: 500.0000 mg | ORAL_TABLET | Freq: Every day | ORAL | 0 refills | Status: DC

## 2016-08-30 NOTE — Telephone Encounter (Signed)
-----   Message from Coralee RudSabrina F Gilley sent at 08/30/2016 10:33 AM EST ----- Regarding: tcm/ph 3/9 2:40 Dr. Mariah MillingGollan

## 2016-08-30 NOTE — Telephone Encounter (Signed)
Attempted to contact pt regarding discharge [facility] on 08/30/16. Left message asking pt to call back regarding discharge instructions and/or medications. Advised pt of appt w/ Dr. Mariah MillingGollan on 09/03/16 at 2:40 w/ [facility]. Asked pt to call back if unable to keep this appt.

## 2016-08-30 NOTE — Discharge Summary (Signed)
[facility] -  at [facility]   PATIENT NAME: Brett LenzBradley Huber    MR#:  [id number]  DATE OF BIRTH:  05/27/1966  DATE OF ADMISSION:  08/27/2016 ADMITTING PHYSICIAN: No admitting provider for patient encounter.  DATE OF DISCHARGE: 08/30/2016 11:58 AM  PRIMARY CARE PHYSICIAN: Saralyn [name] Karamalegos, DO     ADMISSION DIAGNOSIS:  fainting   DISCHARGE DIAGNOSIS:  Principal Problem:   Acute on chronic renal failure (HCC) Active Problems:   Bladder outlet obstruction   BPH (benign prostatic hyperplasia)   Hematuria   Proteinuria   Metabolic encephalopathy   Bacterial pneumonia   Cardiomyopathy (HCC)   Leukocytosis   Hyperglycemia   Demand ischemia (HCC)   SECONDARY DIAGNOSIS:   Past Medical History:  Diagnosis Date  . Anxiety   . Chronic pain   . DDD (degenerative disc disease)   . Hyperlipidemia   . Hypertension   . Insomnia   . 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 He was given Narcan 2, was placed on nonrebreather became alert and was admitted. Labs in the hospital revealed creatinine level of 7.58, patient was given IV fluids and his kidney function normalized after Foley catheter placement by urology. Urinalysis was unremarkable, urine drug screen was positive for opiates and benzodiazepines. Chest x-ray was concerning for CHF initially, multifocal pneumonia on repeated test. Urine cultures showed no growth. Patient was initiated on antibiotic therapy for pneumonia and improved clinically. His kidney function normalized to his baseline, creatinine of 1.25, GFR of more than 60  on the day of discharge 08/30/2016. The patient was followed by urologist while in the hospital, who recommended to continue for a catheter, follow-up with urologist as outpatient for TURP. The patient was felt to be stable to be discharged home today. Discussion by problem: #1. Acute on chronic renal failure, postobstructive, status post Foley catheter placement, appreciate nephrologist and urologist inputs. Needs TURP, per urology, to be [facility], patient is to continue Foley catheter until [facility] is [facility]. #2: Pneumonia, the patient was managed on  Zosyn, sputum culture was not obtained, patient is to continue antibiotic therapy with levofloxacin to complete course. #3. Leukocytosis, resolved with antibiotic therapy #4. Anemia, stable #5. Metabolic encephalopathy, resolved #6, hyperglycemia, hemoglobin A1c was 5.7, no diabetes #7. Elevated troponin, due to demand ischemia , echocardiogram revealed an ejection fraction of 45-50%, grade 1 diastolic dysfunction, elevated pulmonary pressure, the patient was advised to follow up with cardiologist as outpatient  DISCHARGE CONDITIONS:   Stable   CONSULTS OBTAINED:  Treatment Team:  Lamont DowdySarath Kolluru, MD  DRUG ALLERGIES:   Allergies  Allergen Reactions  . Bee Venom Shortness Of Breath    DISCHARGE MEDICATIONS:   Discharge Medication List as of 08/30/2016 11:15 AM    START taking these medications   Details  levofloxacin (LEVAQUIN) 500 MG tablet Take 1 tablet (500 mg total) by mouth daily., Starting Mon 08/30/2016, Normal      CONTINUE these medications which have NOT CHANGED   Details  ALPRAZolam (XANAX) 1 MG tablet Take 1 tablet (1 mg total) by mouth at bedtime as needed for sleep., Starting Mon 07/19/2016, Print  fluticasone (FLONASE) 50 MCG/ACT nasal spray Place 2 sprays into both nostrils daily. Use for 4-6 weeks then stop and use seasonally or as needed., Starting Fri 06/04/2016, Normal    gabapentin  (NEURONTIN) 300 MG capsule Take 300 mg by mouth 4 (four) times daily. , Historical Med    sildenafil (REVATIO) 20 MG tablet Take 3-5 pills as needed 30 minute prior to sex., Print    tamsulosin (FLOMAX) 0.4 MG CAPS capsule Take 1 capsule (0.4 mg total) by mouth daily., Starting Tue 07/06/2016, Print    traZODone (DESYREL) 100 MG tablet Take 1 tablet (100 mg total) by mouth at bedtime., Starting Mon 07/19/2016, Normal    Cholecalciferol (VITAMIN D3) 5000 units CAPS Take 1 capsule (5,000 Units total) by mouth daily. For 12 weeks, then start Vitamin D3 2,000 units daily (OTC), Starting Tue 06/08/2016, Normal    HYDROcodone-acetaminophen (NORCO/VICODIN) 5-325 MG tablet Take 1 tablet by mouth every 6 (six) hours as needed for moderate pain., Starting Mon 07/05/2016, Print    hydrOXYzine (VISTARIL) 25 MG capsule Take 1-2 capsules (25-50 mg total) by mouth at bedtime as needed (sleep)., Starting Tue 05/25/2016, Normal    oxyCODONE (ROXICODONE) 15 MG immediate release tablet Take 15 mg by mouth 4 (four) times daily as needed for pain., Historical Med      STOP taking these medications     amLODipine (NORVASC) 5 MG tablet      olmesartan-hydrochlorothiazide (BENICAR HCT) 20-12.5 MG tablet          DISCHARGE INSTRUCTIONS:    The patient is to follow-up with primary care physician and cardiologist as outpatient, he is to follow-up with [facility] urology as previously scheduled for TURP evaluation   If you experience worsening of your admission symptoms, develop shortness of breath, life threatening emergency, suicidal or homicidal thoughts you must seek medical attention immediately by calling 911 or calling your MD immediately  if symptoms less severe.  You Must read complete instructions/literature along with all the possible adverse reactions/side effects for all the Medicines you take and that have been prescribed to you. Take any new Medicines after you have completely understood and accept  all the possible adverse reactions/side effects.   Please note  You were cared for by a hospitalist during your hospital stay. If you have any questions about your discharge medications or the care you received while you were in the hospital after you are discharged, you can call the unit and asked to speak with the hospitalist on call if the hospitalist that took care of you is not available. Once you are discharged, your primary care physician will handle any further medical issues. Please note that NO REFILLS for any discharge medications will be authorized once you are discharged, as it is imperative that you return to your primary care physician (or establish a relationship with a primary care physician if you do not have one) for your aftercare needs so that they can reassess your need for medications and monitor your lab values.    Today   CHIEF COMPLAINT:   Chief Complaint  Patient presents with  . Altered Mental Status    HISTORY OF PRESENT ILLNESS:  Brett Huber  is a 51 y.o. 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 He was given Narcan  2, was placed on nonrebreather became alert and was admitted. Labs in the hospital revealed creatinine level of 7.58, patient was given IV fluids and his kidney function normalized after Foley catheter placement by urology. Urinalysis was unremarkable, urine drug screen was positive for opiates and benzodiazepines. Chest x-ray was concerning for CHF initially, multifocal pneumonia on repeated test. Urine cultures showed no growth. Patient was initiated on antibiotic therapy for pneumonia and improved clinically. His kidney function normalized to his baseline, creatinine of 1.25, GFR of  more than 60 on the day of discharge 08/30/2016. The patient was followed by urologist while in the hospital, who recommended to continue for a catheter, follow-up with urologist as outpatient for TURP. The patient was felt to be stable to be discharged home today. Discussion by problem: #1. Acute on chronic renal failure, postobstructive, status post Foley catheter placement, appreciate nephrologist and urologist inputs. Needs TURP, per urology, to be done by [facility] urology, patient is to continue Foley catheter until he is seen by [facility] urology. #2: Pneumonia, the patient was managed on  Zosyn, sputum culture was not obtained, patient is to continue antibiotic therapy with levofloxacin to complete course. #3. Leukocytosis, resolved with antibiotic therapy #4. Anemia, stable #5. Metabolic encephalopathy, resolved #6, hyperglycemia, hemoglobin A1c was 5.7, no diabetes #7. Elevated troponin, due to demand ischemia , echocardiogram revealed an ejection fraction of 45-50%, grade 1 diastolic dysfunction, elevated pulmonary pressure, the patient was advised to follow up with cardiologist as outpatient  VITAL SIGNS:  Blood pressure 138/80, pulse 65, temperature 98.1 F (36.7 C), temperature source Oral, resp. rate 20, height 5\' 9"  (1.753 m), weight (!) 182.9 kg (403 lb 3.5 oz), SpO2 96 %.  I/O:   Intake/Output Summary (Last 24 hours) at 08/30/16 1539 Last data filed at 08/30/16 1011  Gross per 24 hour  Intake              320 ml  Output             2205 ml  Net            -1885 ml    PHYSICAL EXAMINATION:  GENERAL:  51 y.o.-year-old patient lying in the bed with no acute distress.  EYES: Pupils equal, round, reactive to light and accommodation. No scleral icterus. Extraocular muscles intact.  HEENT: Head atraumatic, normocephalic. Oropharynx and nasopharynx clear.  NECK:  Supple, no jugular venous distention. No thyroid enlargement, no tenderness.  LUNGS: Normal breath sounds  bilaterally, no wheezing, rales,rhonchi or crepitation. No use of accessory muscles of respiration.  CARDIOVASCULAR: S1, S2 normal. No murmurs, rubs, or gallops.  ABDOMEN: Soft, non-tender, non-distended. Bowel sounds present. No organomegaly or mass.  EXTREMITIES: No pedal edema, cyanosis, or clubbing.  NEUROLOGIC: Cranial nerves II through XII are intact. Muscle strength 5/5 in all extremities. Sensation intact. Gait not checked.  PSYCHIATRIC: The patient is alert and oriented x 3.  SKIN: No obvious rash, lesion, or ulcer.   DATA REVIEW:   CBC  Recent Labs Lab 08/29/16 0310  WBC 6.0  HGB 11.8*  HCT 33.6*  PLT 189    Chemistries   Recent Labs Lab 08/27/16 1121  08/29/16 0310 08/30/16 0321  NA 129*  < > 136 138  K 4.7  < > 3.8 3.7  CL 91*  < > 101 103  CO2 22  < > 30 29  GLUCOSE 137*  < > 103* 98  BUN 60*  < > 23* 16  CREATININE 7.58*  < >  1.66* 1.25*  CALCIUM 8.3*  < > 8.1* 8.6*  MG 2.5*  < > 2.2  --   AST 116*  --   --   --   ALT 61  --   --   --   ALKPHOS 35*  --   --   --   BILITOT 0.8  --   --   --   < > = values in this interval not displayed.  Cardiac Enzymes  Recent Labs Lab 08/28/16 0055  TROPONINI 0.34*    Microbiology Results  Results for orders placed or performed during the hospital encounter of 08/27/16  MRSA PCR Screening     Status: None   Collection Time: 08/27/16 11:17 AM  Result Value Ref Range Status   MRSA by PCR NEGATIVE NEGATIVE Final    Comment:        The GeneXpert MRSA Assay (FDA approved for NASAL specimens only), is one component of a comprehensive MRSA colonization surveillance program. It is not intended to diagnose MRSA infection nor to guide or monitor treatment for MRSA infections.   Blood Culture (routine x 2)     Status: None (Preliminary result)   Collection Time: 08/27/16  1:10 PM  Result Value Ref Range Status   Specimen Description BLOOD RIGHT FOREARM  Final   Special Requests BOTTLES DRAWN AEROBIC AND  ANAEROBIC BCAV  Final   Culture NO GROWTH 3 DAYS  Final   Report Status PENDING  Incomplete  Blood Culture (routine x 2)     Status: None (Preliminary result)   Collection Time: 08/27/16  1:10 PM  Result Value Ref Range Status   Specimen Description BLOOD  RIGHT HAND  Final   Special Requests BOTTLES DRAWN AEROBIC AND ANAEROBIC BCAV  Final   Culture NO GROWTH 3 DAYS  Final   Report Status PENDING  Incomplete  Urine culture     Status: None   Collection Time: 08/27/16  1:14 PM  Result Value Ref Range Status   Specimen Description URINE, RANDOM  Final   Special Requests NONE  Final   Culture   Final    NO GROWTH Performed at [facility], [address] [place], [place] [address]    Report Status 08/28/2016 FINAL  Final    RADIOLOGY:  No results found.  EKG:   Orders placed or performed during the hospital encounter of 08/27/16  . ED EKG  . ED EKG  . EKG 12-Lead  . EKG 12-Lead  . EKG 12-Lead  . EKG 12-Lead  . EKG 12-Lead  . EKG 12-Lead  . ED EKG  . ED EKG  . EKG 12-Lead  . EKG 12-Lead  . EKG      Management plans discussed with the patient, family and they are in agreement.  CODE STATUS:  Code Status History    Date Active Date Inactive Code Status Order ID Comments User Context   08/27/2016  1:16 PM 08/30/2016  3:04 PM Full Code [id number]  Eugenie Norrie, NP ED   07/02/2016  6:11 PM 07/05/2016  6:57 PM Full Code [id number]  Katha Hamming, MD ED      TOTAL TIME TAKING CARE OF THIS PATIENT: 40 minutes.    Katharina Caper M.D on 08/30/2016 at 3:39 PM  Between 7am to 6pm - Pager - [phone]  After 6pm go to [email or web address] - password [email or web address] [facility]  [place] [facility]  Office  [phone]  CC: Primary care physician; Saralyn Pilar, DO

## 2016-08-30 NOTE — Progress Notes (Signed)
Alert and oriented. Vital signs stable . No signs of acute distress. Discharge instructions given. Foley catheter/leg bag education  given to patient. Patient verbalized understanding. No other issues noted at this time.

## 2016-08-31 NOTE — Telephone Encounter (Signed)
I do not see anything that indicates we have ever seen him. Cannot provide a note given that.

## 2016-08-31 NOTE — Telephone Encounter (Signed)
Can we provide this?   I don't see that he was rounded on by our office. Unsure if he was supposed to be TCM.

## 2016-08-31 NOTE — Telephone Encounter (Signed)
Pt calling back stating he needs a letter faxed to [phone] attn: stephanie white head  Stating when he was in hospital and what he was in for and when he is able to go back to work.  He is coming on Friday to see Dr Mariah MillingGollan  He would like a call back on this as well

## 2016-09-01 LAB — CULTURE, BLOOD (ROUTINE X 2)
CULTURE: NO GROWTH
Culture: NO GROWTH

## 2016-09-02 NOTE — Progress Notes (Signed)
Cardiology Office Note  Date:  09/03/2016   ID:  Brett Huber, DOB 1965/10/09, MRN [id number]  PCP:  Saralyn Pilar, DO   Chief Complaint  Patient presents with  . other    Follow up from [facility]; urinary retention with acute renal failure and HTN. Meds reviewed by the pt. verbally.     HPI:  51 year old patient with  recurrent urinary retentionwith acute renal failure,insomnia, hypertension, hyperlipidemia, degenerative disc disease, chronic pain, anxiety, depression, who Recently presented to the hospital 08/27/2016 with mental status changes given Narcan 2, was placed on nonrebreather, creatinine level of 7.58,  kidney function normalized after Foley catheter placement by urology. He presents by referral from the discharge physician, Dr. Winona Legato,  For consultation of his congestive heart failure and depressed ejection fraction seen while he was in the hospital.  Records reviewed from the hospital One of the 2 x-rays he had in the hospital suggested possible CHF, unable to exclude pneumonia Echocardiogram reviewed from the hospital showing ejection fraction 45-50%, mild diffuse hypokinesis, mildly elevated right heart pressures, aortic root was midlly dilated, 3.7 cm  urine drug screen was positive for opiates and benzodiazepines.  He was initiated on antibiotic therapy for pneumonia based on chest x-ray finding and shortness of breath His kidney function normalized to his baseline, creatinine of 1.25, GFR of more than 60 on the day of discharge 08/30/2016.  He was discharged with indwelling Foley catheter He is scheduled to see urology March 14  He had minimally Elevated troponin, due to demand ischemia ,   Prior hx of HTN Nonsmoker nondiabetes Was on statin before Reports that he lost weight and was taken off his statin and blood pressure medication  EKG on today's visit shows no sinus rhythm with rate 75 bpm no significant ST or T-wave changes  He is a Chartered certified accountant,  would like to go back to work but likely unable to work with Foley catheter in place  Other lab work reviewed as below HBa1c 5.7 Lab Results  Component Value Date   CHOL 178 06/04/2016   HDL 42 06/04/2016   LDLCALC 104 (H) 06/04/2016   TRIG 161 (H) 06/04/2016     PMH:   has a past medical history of Anxiety; Chronic pain; DDD (degenerative disc disease); Hyperlipidemia; Hypertension; Insomnia; and Sinus congestion.  PSH:    Past Surgical History:  Procedure Laterality Date  . CERVICAL FUSION  2004  . CHOLECYSTECTOMY  2007  . neck and disc replacement    . SHOULDER ARTHROSCOPY WITH ROTATOR CUFF REPAIR AND SUBACROMIAL DECOMPRESSION Left 01/29/2013   Procedure: LEFT SHOULDER ARTHROSCOPY WITH ROTATOR CUFF REPAIR AND SUBACROMIAL DECOMPRESSION DISTAL CLAVICLE RESECTION;  Surgeon: Mable Paris, MD;  Location: [facility];  Service: Orthopedics;  Laterality: Left;    Current Outpatient Prescriptions  Medication Sig Dispense Refill  . ALPRAZolam (XANAX) 1 MG tablet Take 1 tablet (1 mg total) by mouth at bedtime as needed for sleep. 30 tablet 1  . Cholecalciferol (VITAMIN D3) 5000 units CAPS Take 1 capsule (5,000 Units total) by mouth daily. For 12 weeks, then start Vitamin D3 2,000 units daily (OTC) 90 capsule 0  . fluticasone (FLONASE) 50 MCG/ACT nasal spray Place 2 sprays into both nostrils daily. Use for 4-6 weeks then stop and use seasonally or as needed. 16 g 1  . gabapentin (NEURONTIN) 300 MG capsule Take 300 mg by mouth 4 (four) times daily.     Marland Kitchen HYDROcodone-acetaminophen (NORCO/VICODIN) 5-325 MG tablet Take 1 tablet  by mouth every 6 (six) hours as needed for moderate pain. 20 tablet 0  . hydrOXYzine (VISTARIL) 25 MG capsule Take 1-2 capsules (25-50 mg total) by mouth at bedtime as needed (sleep). 60 capsule 3  . levofloxacin (LEVAQUIN) 500 MG tablet Take 1 tablet (500 mg total) by mouth daily. 5 tablet 0  . oxyCODONE (ROXICODONE) 15 MG immediate release tablet  Take 15 mg by mouth 4 (four) times daily as needed for pain.    . sildenafil (REVATIO) 20 MG tablet Take 3-5 pills as needed 30 minute prior to sex. 50 tablet 3  . tamsulosin (FLOMAX) 0.4 MG CAPS capsule Take 1 capsule (0.4 mg total) by mouth daily. 30 capsule 0  . traZODone (DESYREL) 100 MG tablet Take 1 tablet (100 mg total) by mouth at bedtime. 30 tablet 2   No current facility-administered medications for this visit.      Allergies:   Bee venom   Social History:  The patient  reports that he has never smoked. His smokeless tobacco use includes Chew. He reports that he drinks alcohol. He reports that he does not use drugs.   Family History:   family history includes Diabetes in his mother; Stroke in his father.    Review of Systems: Review of Systems  Constitutional: Negative.   Respiratory: Negative.   Cardiovascular: Negative.   Gastrointestinal: Negative.   Genitourinary: Positive for dysuria.  Musculoskeletal: Negative.   Neurological: Negative.   Psychiatric/Behavioral: Negative.   All other systems reviewed and are negative.    PHYSICAL EXAM: VS:  BP (!) 130/94 (BP Location: Left Arm, Patient Position: Sitting, Cuff Size: Normal)   Pulse 75   Ht 5\' 9"  (1.753 m)   Wt 188 lb 8 oz (85.5 kg)   BMI 27.84 kg/m  , BMI Body mass index is 27.84 kg/m. GEN: Well nourished, well developed, in no acute distress  He reports having full a catheter in place HEENT: normal  Neck: no JVD, carotid bruits, or masses Cardiac: RRR; no murmurs, rubs, or gallops,no edema  Respiratory:  clear to auscultation bilaterally, normal work of breathing GI: soft, nontender, nondistended, + BS MS: no deformity or atrophy  Skin: warm and dry, no rash Neuro:  Strength and sensation are intact Psych: euthymic mood, full affect    Recent Labs: 06/04/2016: TSH 3.78 08/27/2016: ALT 61; B Natriuretic Peptide 174.0 08/29/2016: Hemoglobin 11.8; Magnesium 2.2; Platelets 189 08/30/2016: BUN 16;  Creatinine, Ser 1.25; Potassium 3.7; Sodium 138    Lipid Panel Lab Results  Component Value Date   CHOL 178 06/04/2016   HDL 42 06/04/2016   LDLCALC 104 (H) 06/04/2016   TRIG 161 (H) 06/04/2016      Wt Readings from Last 3 Encounters:  09/03/16 188 lb 8 oz (85.5 kg)  08/30/16   08/23/16 186 lb (84.4 kg)       ASSESSMENT AND PLAN:  Essential hypertension - Plan: EKG 12-Lead Blood pressure borderline elevated, recommended he start Cardura 2 mg twice a day if tolerated This will also help prostate issues Other options include low-dose ACE inhibitor, beta blocker given minimally depressed ejection fraction  Mixed hyperlipidemia - Plan: EKG 12-Lead Cholesterol relatively well-controlled Acute renal failure superimposed on stage 3 chronic kidney disease, unspecified acute renal failure type (HCC) - Plan: EKG 12-Lead  Bladder outlet obstruction - Plan: EKG 12-Lead He is scheduled to see urology later next week He is hoping to have catheter removed and go back to work  Bacterial pneumonia - Plan:  EKG 12-Lead Unclear. Pneumonia, chest x-ray reviewed by myself also with the patient Nonspecific cough likely secondary to fluid retention while in the hospital  Metabolic encephalopathy - Plan: EKG 12-Lead Secondary to uremia, fluid retention, improved with placement of Foley catheter, respiratory support  Dilated cardiomyopathy (HCC) - Plan: EKG 12-Lead Borderline low ejection fraction likely secondary to recent stressors from urine retention,Massive fluid retention, kidney failure, ATN. We have recommended repeat echocardiogram in 2-3 months time for reevaluation.  Likely did not have congestive heart failure, only had outflow tract obstruction causing fluid retention. If ejection fraction continues to run low on repeat, additional workup may be required such as stress testing.    Mr. Armida SansShadowens was seen in consultation today and will be referred back to his primary care physician  Dr. Alwyn PeaKaramaleglos for further ongoing care   Total encounter time more than 60 minutes  Greater than 50% was spent in counseling and coordination of care with the patient  Disposition:    We will call him with the results of the echo    Orders Placed This Encounter  Procedures  . EKG 12-Lead     Signed, Dossie Arbourim Gollan, M.D., Ph.D. 09/03/2016  [facility], [place] [phone]

## 2016-09-03 ENCOUNTER — Ambulatory Visit: Payer: Managed Care, Other (non HMO) | Admitting: Family Medicine

## 2016-09-03 ENCOUNTER — Other Ambulatory Visit: Payer: Self-pay | Admitting: Family Medicine

## 2016-09-03 ENCOUNTER — Encounter: Payer: Self-pay | Admitting: Cardiovascular Disease

## 2016-09-03 ENCOUNTER — Ambulatory Visit (INDEPENDENT_AMBULATORY_CARE_PROVIDER_SITE_OTHER): Payer: Managed Care, Other (non HMO) | Admitting: Cardiovascular Disease

## 2016-09-03 VITALS — BP 130/94 | HR 75 | Ht 69.0 in | Wt 188.5 lb

## 2016-09-03 DIAGNOSIS — J159 Unspecified bacterial pneumonia: Secondary | ICD-10-CM

## 2016-09-03 DIAGNOSIS — F5101 Primary insomnia: Secondary | ICD-10-CM

## 2016-09-03 DIAGNOSIS — I420 Dilated cardiomyopathy: Secondary | ICD-10-CM

## 2016-09-03 DIAGNOSIS — N179 Acute kidney failure, unspecified: Secondary | ICD-10-CM

## 2016-09-03 DIAGNOSIS — E782 Mixed hyperlipidemia: Secondary | ICD-10-CM | POA: Diagnosis not present

## 2016-09-03 DIAGNOSIS — N183 Chronic kidney disease, stage 3 (moderate): Secondary | ICD-10-CM

## 2016-09-03 DIAGNOSIS — N320 Bladder-neck obstruction: Secondary | ICD-10-CM | POA: Diagnosis not present

## 2016-09-03 DIAGNOSIS — G9341 Metabolic encephalopathy: Secondary | ICD-10-CM

## 2016-09-03 DIAGNOSIS — I10 Essential (primary) hypertension: Secondary | ICD-10-CM

## 2016-09-03 MED ORDER — DOXAZOSIN MESYLATE 4 MG PO TABS: 4.0000 mg | ORAL_TABLET | Freq: Every day | ORAL | 6 refills | Status: DC

## 2016-09-03 NOTE — Patient Instructions (Addendum)
Medication Instructions:   Please start cardura 1/2 pill twice a day  Labwork:  No new labs needed  Testing/Procedures:  Repeat echocardiogram in 2 to 3 months   I recommend watching educational videos on topics of interest to you at:       [email or web address]  Enter code: HEARTCARE    Follow-Up: It was a pleasure seeing you in the office today. Please call us if you have new issues that need to be addressed before your next appt.  [phone]  Your physician wants you to follow-up in:  We will call you with the results of the echocardiogram   If you need a refill on your cardiac medications before your next appointment, please call your pharmacy.    Echocardiogram An echocardiogram, or echocardiography, uses sound waves (ultrasound) to produce an image of your heart. The echocardiogram is simple, painless, obtained within a short period of time, and offers valuable information to your health care provider. The images from an echocardiogram can provide information such as:  Evidence of coronary artery disease (CAD).  Heart size.  Heart muscle function.  Heart valve function.  Aneurysm detection.  Evidence of a past heart attack.  Fluid buildup around the heart.  Heart muscle thickening.  Assess heart valve function. Tell a health care provider about:  Any allergies you have.  All medicines you are taking, including vitamins, herbs, eye drops, creams, and over-the-counter medicines.  Any problems you or family members have had with anesthetic medicines.  Any blood disorders you have.  Any surgeries you have had.  Any medical conditions you have.  Whether you are pregnant or may be pregnant. What happens before the procedure? No special preparation is needed. Eat and drink normally. What happens during the procedure?  In order to produce an image of your heart, gel will be applied to your chest and a wand-like tool (transducer) will be moved over your  chest. The gel will help transmit the sound waves from the transducer. The sound waves will harmlessly bounce off your heart to allow the heart images to be captured in real-time motion. These images will then be recorded.  You may need an IV to receive a medicine that improves the quality of the pictures. What happens after the procedure? You may return to your normal schedule including diet, activities, and medicines, unless your health care provider tells you otherwise. This information is not intended to replace advice given to you by your health care provider. Make sure you discuss any questions you have with your health care provider. Document Released: 06/11/2000 Document Revised: 01/31/2016 Document Reviewed: 02/19/2013 Elsevier Interactive Patient Education  2017 [employer]

## 2016-09-07 ENCOUNTER — Encounter: Payer: Self-pay | Admitting: Family Medicine

## 2016-09-07 ENCOUNTER — Ambulatory Visit (INDEPENDENT_AMBULATORY_CARE_PROVIDER_SITE_OTHER): Payer: Managed Care, Other (non HMO) | Admitting: Family Medicine

## 2016-09-07 VITALS — BP 124/82 | HR 107 | Temp 98.2°F | Resp 16 | Ht 69.0 in | Wt 181.0 lb

## 2016-09-07 DIAGNOSIS — N179 Acute kidney failure, unspecified: Secondary | ICD-10-CM

## 2016-09-07 DIAGNOSIS — N320 Bladder-neck obstruction: Secondary | ICD-10-CM

## 2016-09-07 DIAGNOSIS — N401 Enlarged prostate with lower urinary tract symptoms: Secondary | ICD-10-CM | POA: Diagnosis not present

## 2016-09-07 DIAGNOSIS — I10 Essential (primary) hypertension: Secondary | ICD-10-CM

## 2016-09-07 DIAGNOSIS — G9341 Metabolic encephalopathy: Secondary | ICD-10-CM

## 2016-09-07 DIAGNOSIS — N138 Other obstructive and reflux uropathy: Secondary | ICD-10-CM

## 2016-09-07 DIAGNOSIS — F5101 Primary insomnia: Secondary | ICD-10-CM

## 2016-09-07 DIAGNOSIS — N183 Chronic kidney disease, stage 3 (moderate): Secondary | ICD-10-CM

## 2016-09-07 DIAGNOSIS — I420 Dilated cardiomyopathy: Secondary | ICD-10-CM

## 2016-09-07 MED ORDER — TRAZODONE HCL 100 MG PO TABS: 100.0000 mg | ORAL_TABLET | Freq: Every day | ORAL | 5 refills | Status: DC

## 2016-09-07 NOTE — Assessment & Plan Note (Signed)
Mild elevated BP today, improved on re-check manual Home readings slightly elevated as well Recent changes from hospital DC off Amlodipine, ARB-HCTZ, now only on alpha blocker  Plan: 1. Continue current rx from Cardiology with Cardura 4mg  daily, advised patient to take in AM once daily, he was trying half tab for dose 2mg  BID, tolerating well, discussed max dose in future if needed is 8mg , but follow-up with respective Cards / Urology first 2. Remain hold - Amlodipine 5mg  and Olemsartan-HCTZ - in future if elevated BP can resume Amlodipine 5mg  daily for now, due to concern with CKD, would defer decision to restart ARB to Nephrology 3. Monitor BP at home 4. Follow-up as planned within few months, can bring BP cuff in for nurse only BP check in future if needed

## 2016-09-07 NOTE — Assessment & Plan Note (Addendum)
Stable mild LVEF reduced, possible dilated cardiomyopathy by report on last ECHO, with some demand ischemia with acute event, since resolved - [facility] Mariah MillingGollan - Anticipate future repeat ECHO 2-3 months monitor if resolved LVEF, may need further work-up if still problem, consider ACEi vs BB if still decreased

## 2016-09-07 NOTE — Progress Notes (Signed)
Subjective:    Patient ID: Brett Huber, male    DOB: Nov 15, 1965, 51 y.o.   MRN: [id number]  Brett Huber is a 51 y.o. male presenting on 09/07/2016 for Hospitalization Follow-up (improved)   HPI   Hospital Follow-up, admitted 08/27/16, discharged 08/30/16  Specialists: Urology - Michiel Cowboy, PA [facility]) Nephrology - Dr Mady Haagensen [facility], [facility]) Cardiology - Dr Julien Nordmann [facility])  FOLLOW-UP Acute on Chronic Renail Failure, secondary to urinary obstruction with urinary retention from BPH / Acute Metabolic Encephalopathy (Resolved) - Prior hospitalization 05/2016 for similar problem with acute metabolic encephalopathy - Now recent hospitalization 08/2016 he was admitted to ICU - PCCM with similar altered mental status, see H&P note for presentation, had critical abnormality with Cr 7.58, BUN 60, troponin elevated, pulmonary edema with acute hypoxic resp failure, acute on chronic renal failure and acute hypoxic resp failure. During ICU hospitalization, he was stabilized, Cr significantly improved with foley catheter and resolution of obstructive uropathy. Additional treatments for CHF, PNA see below. - On discharge, Urinary foley catheter left in place, to be followed outpatient, discussion was for possible dilation vs TURP outpatient - His next apt is tomorrow 3/14 with Urology, he is awaiting plans for future procedure to determine when to return to work - Still taking Flomax 0.4mg  daily, doing well, no refills he has >6 month supply from Nephrology - Improved his hydration with water intake half to whole gallon most days - Remains off NSAIDs, only taking Tylenol now, not as effective for headaches - Does admit to prior to hospitalization he would lose track of how many times he actually urinated in a day and may have gone long time without voiding - Denies any problem currently with voiding (foley output is good),  PO intake, abdominal or flank pain  Follow-up PNUEMONIA vs PULMONARY EDEMA (Secondary to volume overload) - Resolved - See hospital H&P and DC Summary for details, also reviewed imaging and results - He was managed with broad spectrum antibiotics in ICU, narrowed to discharge on PO Levaquin for about 5 more days. 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 Plans to repeat ECHO in 2-3 months for re-evaluation, thought that possible CHF was more from urinary outflow tract obstruction and acute fluid retention / overload, leading to secondary pulm edema - Patient with no new concerns today, no edema or new swelling, chest pain, dyspnea or other concerns - He will continue follow-up with Cardiology as planned  CHRONIC HTN: Reports checking BP at home, machine reading today was 150/90 for past 2 days Current Meds - Doxazosin 4mg  (taking half tab in AM and PM) - started by Cardiology to help urinary flow and BP - In hospital BP was controlled, and he was stopped on prior meds Amlodipine 5mg  and Olemsartan-HCTZ 20-12.5mg , he has these meds at home but not restarting at this time - Followed by Cardiology, recently seen on 09/03/16 had advised future may need low dose ACEi vs BB with some minimally reduced LVEF on last ECHO,  but will be receiving future ECHO for follow-up Denies CP, dyspnea, HA, edema, dizziness / lightheadedness   FOLLOW-UP INSOMNIA, Chronic: - Last visit was hospital follow-up, has not returned since now recently re admitted again for  AoCKD. Since last discharge, he was switched from Hydroxyzine and TCA Amitriptyline back on resumed therapy with Trazodone and Xanax, to avoid potential kidney complications and improve sleep as he had best success on these meds in past. - Today he reports he has done much better overall on xanax and trazodone current doses for sleep, able to sleep again at night, >7 hours feels more rested overall doing better - He admits to accidentally requesting xanax refill earlier this week will not fill it if not needed, not trying to fill it early - Denies any mania symptoms, pain keeping him awake, tremors, nightmares, restless leg syndrome  Chronic Pain - Additional history stating that Pain Management plans to wean him off of Oxycodone 15mg  IR in future gradually, patient agrees with this plan interested in alternative pain management in future as he used to work for hospice and does not want to stay on opioids for long time   Social History  Substance Use Topics  . Smoking status: Never Smoker  . Smokeless tobacco: Current User    Types: Chew  . Alcohol use Yes     Comment: occ    Review of Systems Per HPI unless specifically indicated above     Objective:    BP 124/82 (BP Location: Left Arm, Cuff Size: Normal)   Pulse (!) 107   Temp 98.2 F (36.8 C) (Oral)   Resp 16   Ht 5\' 9"  (1.753 m)   Wt 181 lb (82.1 kg)   BMI 26.73 kg/m   Wt Readings from Last 3 Encounters:  09/07/16 181 lb (82.1 kg)  09/03/16 188 lb 8 oz (85.5 kg)  08/30/16 (!) 403 lb 3.5 oz (182.9 kg)    Physical Exam  Constitutional: He is oriented to person, place, and time. He appears well-developed and well-nourished. No distress.  Tired but well-appearing, comfortable, cooperative  HENT:  Head: Normocephalic and atraumatic.  Mouth/Throat: Oropharynx is clear and moist.  Bilateral TMs clear without erythema, effusion or bulging.  Conversational hearing normal.  Oropharynx clear without erythema, exudates, edema  or asymmetry.  Eyes: Conjunctivae are normal.  Cardiovascular: Normal rate, regular rhythm, normal heart sounds and intact distal pulses.   No murmur heard. Pulmonary/Chest: Effort normal.  Musculoskeletal: He exhibits no edema.  Neurological: He is alert and oriented to person, place, and time.  Skin: Skin is warm and dry. No rash noted. He is not diaphoretic.  Psychiatric: His behavior is normal.  Nursing note and vitals reviewed.   Results for orders placed or performed during the hospital encounter of 08/27/16  Blood Culture (routine x 2)  Result Value Ref Range   Specimen Description BLOOD RIGHT FOREARM    Special Requests BOTTLES DRAWN AEROBIC AND ANAEROBIC BCAV    Culture NO GROWTH 5 DAYS    Report Status 09/01/2016 FINAL   Blood Culture (routine x 2)  Result Value Ref Range   Specimen Description BLOOD  RIGHT HAND    Special Requests BOTTLES DRAWN AEROBIC AND ANAEROBIC BCAV    Culture NO GROWTH 5 DAYS    Report Status 09/01/2016 FINAL   Urine culture  Result Value Ref Range   Specimen Description URINE, RANDOM    Special Requests NONE    Culture      NO  GROWTH Performed at [facility], [address], [place], [place] [address]    Report Status 08/28/2016 FINAL   MRSA PCR Screening  Result Value Ref Range   MRSA by PCR NEGATIVE NEGATIVE  Glucose, capillary  Result Value Ref Range   Glucose-Capillary 134 (H) 65 - 99 mg/dL  CBC with Differential  Result Value Ref Range   WBC 16.4 (H) 3.8 - 10.6 K/uL   RBC 3.91 (L) 4.40 - 5.90 MIL/uL   Hemoglobin 11.2 (L) 13.0 - 18.0 g/dL   HCT 60.4 (L) 54.0 - 98.1 %   MCV 82.8 80.0 - 100.0 fL   MCH 28.5 26.0 - 34.0 pg   MCHC 34.5 32.0 - 36.0 g/dL   RDW 19.1 (H) 47.8 - 29.5 %   Platelets 266 150 - 440 K/uL   Neutrophils Relative % 94 %   Neutro Abs 15.2 (H) 1.4 - 6.5 K/uL   Lymphocytes Relative 1 %   Lymphs Abs 0.2 (L) 1.0 - 3.6 K/uL   Monocytes Relative 5 %   Monocytes Absolute 0.9 0.2 - 1.0 K/uL   Eosinophils  Relative 0 %   Eosinophils Absolute 0.0 0 - 0.7 K/uL   Basophils Relative 0 %   Basophils Absolute 0.0 0 - 0.1 K/uL  Comprehensive metabolic panel  Result Value Ref Range   Sodium 129 (L) 135 - 145 mmol/L   Potassium 4.7 3.5 - 5.1 mmol/L   Chloride 91 (L) 101 - 111 mmol/L   CO2 22 22 - 32 mmol/L   Glucose, Bld 137 (H) 65 - 99 mg/dL   BUN 60 (H) 6 - 20 mg/dL   Creatinine, Ser 6.21 (H) 0.61 - 1.24 mg/dL   Calcium 8.3 (L) 8.9 - 10.3 mg/dL   Total Protein 6.7 6.5 - 8.1 g/dL   Albumin 3.6 3.5 - 5.0 g/dL   AST 308 (H) 15 - 41 U/L   ALT 61 17 - 63 U/L   Alkaline Phosphatase 35 (L) 38 - 126 U/L   Total Bilirubin 0.8 0.3 - 1.2 mg/dL   GFR calc non Af Amer 7 (L) >60 mL/min   GFR calc Af Amer 8 (L) >60 mL/min   Anion gap 16 (H) 5 - 15  Troponin I  Result Value Ref Range   Troponin I 0.24 (HH) <0.03 ng/mL  Lipase, blood  Result Value Ref Range   Lipase <10 (L) 11 - 51 U/L  Lactic acid, plasma  Result Value Ref Range   Lactic Acid, Venous 1.9 0.5 - 1.9 mmol/L  Blood gas, venous ([facility], [facility], [facility])  Result Value Ref Range   pH, Ven 7.27 7.250 - 7.430   pCO2, Ven 52 44.0 - 60.0 mmHg   pO2, Ven 32.0 32.0 - 45.0 mmHg   Bicarbonate 23.9 20.0 - 28.0 mmol/L   Acid-base deficit 3.4 (H) 0.0 - 2.0 mmol/L   O2 Saturation 51.5 %   Patient temperature 37.0    Collection site VEIN    Sample type VEIN   Urine Drug Screen, Qualitative ([facility] only)  Result Value Ref Range   Tricyclic, Ur Screen NONE DETECTED NONE DETECTED   Amphetamines, Ur Screen NONE DETECTED NONE DETECTED   MDMA (Ecstasy)Ur Screen NONE DETECTED NONE DETECTED   Cocaine Metabolite,Ur [place] NONE DETECTED NONE DETECTED   Opiate, Ur Screen POSITIVE (A) NONE DETECTED   Phencyclidine (PCP) Ur S NONE DETECTED NONE DETECTED   Cannabinoid 50 Ng, Ur [place] NONE DETECTED NONE DETECTED   Barbiturates, Ur Screen NONE  DETECTED NONE DETECTED   Benzodiazepine, Ur Scrn POSITIVE (A) NONE DETECTED   Methadone Scn, Ur NONE DETECTED NONE DETECTED    Salicylate level  Result Value Ref Range   Salicylate Lvl <7.0 2.8 - 30.0 mg/dL  Acetaminophen level  Result Value Ref Range   Acetaminophen (Tylenol), Serum <10 (L) 10 - 30 ug/mL  Urinalysis, Complete w Microscopic  Result Value Ref Range   Color, Urine YELLOW (A) YELLOW   APPearance CLOUDY (A) CLEAR   Specific Gravity, Urine 1.011 1.005 - 1.030   pH 5.0 5.0 - 8.0   Glucose, UA 50 (A) NEGATIVE mg/dL   Hgb urine dipstick LARGE (A) NEGATIVE   Bilirubin Urine NEGATIVE NEGATIVE   Ketones, ur NEGATIVE NEGATIVE mg/dL   Protein, ur 30 (A) NEGATIVE mg/dL   Nitrite NEGATIVE NEGATIVE   Leukocytes, UA NEGATIVE NEGATIVE   RBC / HPF 0-5 0 - 5 RBC/hpf   WBC, UA 0-5 0 - 5 WBC/hpf   Bacteria, UA NONE SEEN NONE SEEN   Squamous Epithelial / LPF 0-5 (A) NONE SEEN   Mucous PRESENT    Hyaline Casts, UA PRESENT    Amorphous Crystal PRESENT   Magnesium  Result Value Ref Range   Magnesium 2.5 (H) 1.7 - 2.4 mg/dL  Brain natriuretic peptide  Result Value Ref Range   B Natriuretic Peptide 174.0 (H) 0.0 - 100.0 pg/mL  Troponin I  Result Value Ref Range   Troponin I 0.40 (HH) <0.03 ng/mL  Troponin I  Result Value Ref Range   Troponin I 0.34 (HH) <0.03 ng/mL  Procalcitonin - Baseline  Result Value Ref Range   Procalcitonin 1.62 ng/mL  Troponin I  Result Value Ref Range   Troponin I 0.38 (HH) <0.03 ng/mL  Glucose, capillary  Result Value Ref Range   Glucose-Capillary 142 (H) 65 - 99 mg/dL  Influenza panel by PCR (type A & B)  Result Value Ref Range   Influenza A By PCR NEGATIVE NEGATIVE   Influenza B By PCR NEGATIVE NEGATIVE  Basic metabolic panel  Result Value Ref Range   Sodium 131 (L) 135 - 145 mmol/L   Potassium 4.1 3.5 - 5.1 mmol/L   Chloride 100 (L) 101 - 111 mmol/L   CO2 22 22 - 32 mmol/L   Glucose, Bld 140 (H) 65 - 99 mg/dL   BUN 49 (H) 6 - 20 mg/dL   Creatinine, Ser 4.09 (H) 0.61 - 1.24 mg/dL   Calcium 8.0 (L) 8.9 - 10.3 mg/dL   GFR calc non Af Amer 11 (L) >60 mL/min   GFR  calc Af Amer 13 (L) >60 mL/min   Anion gap 9 5 - 15  CBC  Result Value Ref Range   WBC 13.3 (H) 3.8 - 10.6 K/uL   RBC 4.08 (L) 4.40 - 5.90 MIL/uL   Hemoglobin 11.5 (L) 13.0 - 18.0 g/dL   HCT 81.1 (L) 91.4 - 78.2 %   MCV 81.4 80.0 - 100.0 fL   MCH 28.3 26.0 - 34.0 pg   MCHC 34.7 32.0 - 36.0 g/dL   RDW 95.6 (H) 21.3 - 08.6 %   Platelets 212 150 - 440 K/uL  Basic metabolic panel  Result Value Ref Range   Sodium 134 (L) 135 - 145 mmol/L   Potassium 3.3 (L) 3.5 - 5.1 mmol/L   Chloride 99 (L) 101 - 111 mmol/L   CO2 26 22 - 32 mmol/L   Glucose, Bld 156 (H) 65 - 99 mg/dL   BUN 40 (H)  6 - 20 mg/dL   Creatinine, Ser 1.61 (H) 0.61 - 1.24 mg/dL   Calcium 8.0 (L) 8.9 - 10.3 mg/dL   GFR calc non Af Amer 20 (L) >60 mL/min   GFR calc Af Amer 23 (L) >60 mL/min   Anion gap 9 5 - 15  Magnesium  Result Value Ref Range   Magnesium 2.5 (H) 1.7 - 2.4 mg/dL  Phosphorus  Result Value Ref Range   Phosphorus 3.0 2.5 - 4.6 mg/dL  Procalcitonin  Result Value Ref Range   Procalcitonin 3.64 ng/mL  Procalcitonin  Result Value Ref Range   Procalcitonin 1.85 ng/mL  CBC  Result Value Ref Range   WBC 6.0 3.8 - 10.6 K/uL   RBC 4.07 (L) 4.40 - 5.90 MIL/uL   Hemoglobin 11.8 (L) 13.0 - 18.0 g/dL   HCT 09.6 (L) 04.5 - 40.9 %   MCV 82.4 80.0 - 100.0 fL   MCH 28.9 26.0 - 34.0 pg   MCHC 35.1 32.0 - 36.0 g/dL   RDW 81.1 (H) 91.4 - 78.2 %   Platelets 189 150 - 440 K/uL  Basic metabolic panel  Result Value Ref Range   Sodium 136 135 - 145 mmol/L   Potassium 3.8 3.5 - 5.1 mmol/L   Chloride 101 101 - 111 mmol/L   CO2 30 22 - 32 mmol/L   Glucose, Bld 103 (H) 65 - 99 mg/dL   BUN 23 (H) 6 - 20 mg/dL   Creatinine, Ser 9.56 (H) 0.61 - 1.24 mg/dL   Calcium 8.1 (L) 8.9 - 10.3 mg/dL   GFR calc non Af Amer 46 (L) >60 mL/min   GFR calc Af Amer 53 (L) >60 mL/min   Anion gap 5 5 - 15  Magnesium  Result Value Ref Range   Magnesium 2.2 1.7 - 2.4 mg/dL  Hemoglobin O1H  Result Value Ref Range   Hgb A1c MFr Bld 5.7  (H) 4.8 - 5.6 %   Mean Plasma Glucose 117 mg/dL  Basic metabolic panel  Result Value Ref Range   Sodium 138 135 - 145 mmol/L   Potassium 3.7 3.5 - 5.1 mmol/L   Chloride 103 101 - 111 mmol/L   CO2 29 22 - 32 mmol/L   Glucose, Bld 98 65 - 99 mg/dL   BUN 16 6 - 20 mg/dL   Creatinine, Ser 0.86 (H) 0.61 - 1.24 mg/dL   Calcium 8.6 (L) 8.9 - 10.3 mg/dL   GFR calc non Af Amer >60 >60 mL/min   GFR calc Af Amer >60 >60 mL/min   Anion gap 6 5 - 15  Echocardiogram  Result Value Ref Range   Weight 3,005.31 oz   Height 69 in   BP 115/74 mmHg      Assessment & Plan:   Problem List Items Addressed This Visit    RESOLVED: Metabolic encephalopathy    Resolved from ICU hospitalization with corrected uremia / nephropathy      Insomnia    Significant improvement back on Xanax, Trazodone, limited from other options due to CKD. - Refilled Xanax by phone for 2 month supply earlier this week, keep refills on hold at pharmacy if not due yet - Follow-up for dedicated insomnia visit med management with me within 3 months as planned      Relevant Medications   traZODone (DESYREL) 100 MG tablet   Hypertension    Mild elevated BP today, improved on re-check manual Home readings slightly elevated as well Recent changes from hospital DC off  Amlodipine, ARB-HCTZ, now only on alpha blocker  Plan: 1. Continue current rx from Cardiology with Cardura 4mg  daily, advised patient to take in AM once daily, he was trying half tab for dose 2mg  BID, tolerating well, discussed max dose in future if needed is 8mg , but follow-up with respective Cards / Urology first 2. Remain hold - Amlodipine 5mg  and Olemsartan-HCTZ - in future if elevated BP can resume Amlodipine 5mg  daily for now, due to concern with CKD, would defer decision to restart ARB to Nephrology 3. Monitor BP at home 4. Follow-up as planned within few months, can bring BP cuff in for nurse only BP check in future if needed      Cardiomyopathy (HCC)     Stable mild LVEF reduced, possible dilated cardiomyopathy by report on last ECHO, with some demand ischemia with acute event, since resolved - [facility] Mariah MillingGollan - Anticipate future repeat ECHO 2-3 months monitor if resolved LVEF, may need further work-up if still problem, consider ACEi vs BB if still decreased      BPH (benign prostatic hyperplasia)    Secondary etiology for urinary obstruction Improved, with urinary foley in place currently from discharge hospital Follow-up with Urology [facility] as scheduled this week, further foley management and potential procedural resolution, maybe TURP - Continue Flomax, Cardura for med management      Relevant Medications   tamsulosin (FLOMAX) 0.4 MG CAPS capsule   Bladder outlet obstruction    Improved, with urinary foley in place currently from discharge hospital Follow-up with Urology [facility] as scheduled this week, further foley management and potential procedural resolution, maybe TURP - Continue Flomax, Cardura for med management      Acute on chronic renal failure (HCC) - Primary    Resolved AKI second acute insult in 3 months, has improved back to baseline Cr on discharge approx 1.2, with peak during hospitalization up to Cr 7-8 - Etiology more clear on this hospitalization now with 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 Now not taking NSAIDs, and not thought to be related to ATN  Plan: 1. Remain foley catheter in place - follow-up as scheduled this week with Urology [facility] for next step in management with foley vs procedural, possible TURP to resolve outflow obstruction 2. Closely monitor BP outside office - continue on new cardura, hold other anti-HTN meds 3. Follow-up as planned with Nephrology, Urology         Meds ordered this encounter  Medications  . traZODone (DESYREL) 100 MG tablet    Sig: Take 1 tablet (100 mg total) by mouth at bedtime.    Dispense:  30 tablet    Refill:  5  . tamsulosin (FLOMAX) 0.4 MG CAPS  capsule    Sig: Take 0.4 mg by mouth daily.      Follow up plan: Return in about 3 months (around 12/08/2016) for insomnia, med refills, HTN.  Saralyn [name] Toria Monte, DO [facility] 09/07/2016, 11:59 PM

## 2016-09-07 NOTE — Progress Notes (Signed)
09/08/2016 9:20 AM   Brett Huber 03-23-1966 [id number]  Referring provider: Smitty Cords, DO [address] [place], [place] [address]  Chief Complaint  Patient presents with  . Follow-up    ER follow up ? possible cath removal    HPI: 51 yo WM who presents today as a follow up from the hospital.  Patient was referred to [place] by Dr. Althea Charon for urinary retention.  He was seen on 08/23/2016.  He was on tamsulosin 0.4 mg daily and his PVR was minimal at 141 mL.  A week later, he had an accidental overdose of narcotics and was admitted to [facility].  He was found to be in ARF with creatinine of 7.58.  Foley catheter was placed and remains in place today.  Most recent creatinine was 1.25.    His most recent IPSS score was 3, which is mild lower urinary tract symptomatology. He was mixed with his quality life due to his urinary symptoms.  His PVR was 141 mL.   His major complaints today are intermittency and hesitency.  He has had these symptoms for several months. He denies any dysuria, hematuria or suprapubic pain. He currently taking tamsulosin 0.4 mg daily.  He has had a lapse in therapy.  He has been on the medication for six weeks.  He also denies any recent fevers, chills, nausea or vomiting.  He does not have a family history of PCa.      IPSS    Row Name 08/23/16 1000         International Prostate Symptom Score   How often have you had the sensation of not emptying your bladder? Less than 1 in 5     How often have you had to urinate less than every two hours? Less than 1 in 5 times     How often have you found you stopped and started again several times when you urinated? Not at All     How often have you found it difficult to postpone urination? Not at All     How often have you had a weak urinary stream? Less than 1 in 5 times     How often have you had to strain to start urination? Not at All     How many times did you typically get up at night to urinate? None      Total IPSS Score 3       Quality of Life due to urinary symptoms   If you were to spend the rest of your life with your urinary condition just the way it is now how would you feel about that? Mixed        Score:  1-7 Mild 8-19 Moderate 20-35 Severe   Erectile dysfunction His SHIM score is 6, which is severe ED.   He has been having difficulty with erections for ten years.   His major complaint is lack of erections.  His libido is preserved.   His risk factors for ED are age, DJD, HTN, HLD, anxiety, depression, antidepressants, pain medication and blood pressure medications.    He denies any painful erections or curvatures with his erections.   He is rarely having spontaneous erections.       SHIM    Row Name 08/23/16 1115         SHIM: Over the last 6 months:   How do you rate your confidence that you could get and keep an erection? Very Low  When you had erections with sexual stimulation, how often were your erections hard enough for penetration (entering your partner)? Almost Never or Never     During sexual intercourse, how often were you able to maintain your erection after you had penetrated (entered) your partner? Very Difficult     During sexual intercourse, how difficult was it to maintain your erection to completion of intercourse? Extremely Difficult     When you attempted sexual intercourse, how often was it satisfactory for you? Extremely Difficult       SHIM Total Score   SHIM 6        Score: 1-7 Severe ED 8-11 Moderate ED 12-16 Mild-Moderate ED 17-21 Mild ED 22-25 No ED   PMH: Past Medical History:  Diagnosis Date  . Anxiety   . Chronic pain   . DDD (degenerative disc disease)   . Hyperlipidemia   . Hypertension   . Insomnia   . Sinus congestion     Surgical History: Past Surgical History:  Procedure Laterality Date  . CERVICAL FUSION  2004  . CHOLECYSTECTOMY  2007  . neck and disc replacement    . SHOULDER ARTHROSCOPY WITH ROTATOR  CUFF REPAIR AND SUBACROMIAL DECOMPRESSION Left 01/29/2013   Procedure: LEFT SHOULDER ARTHROSCOPY WITH ROTATOR CUFF REPAIR AND SUBACROMIAL DECOMPRESSION DISTAL CLAVICLE RESECTION;  Surgeon: Mable Paris, MD;  Location: [facility];  Service: Orthopedics;  Laterality: Left;    Home Medications:  Allergies as of 09/08/2016      Reactions   Bee Venom Shortness Of Breath      Medication List       Accurate as of 09/08/16  9:20 AM. Always use your most recent med list.          ALPRAZolam 1 MG tablet Commonly known as:  XANAX TAKE ONE TABLET BY MOUTH AT BEDTIME AS NEEDED FOR SLEEP   doxazosin 4 MG tablet Commonly known as:  CARDURA Take 1 tablet (4 mg total) by mouth daily.   fluticasone 50 MCG/ACT nasal spray Commonly known as:  FLONASE Place 2 sprays into both nostrils daily. Use for 4-6 weeks then stop and use seasonally or as needed.   gabapentin 300 MG capsule Commonly known as:  NEURONTIN Take 300 mg by mouth 4 (four) times daily.   oxyCODONE 15 MG immediate release tablet Commonly known as:  ROXICODONE Take 15 mg by mouth 4 (four) times daily as needed for pain.   sildenafil 20 MG tablet Commonly known as:  REVATIO Take 3-5 pills as needed 30 minute prior to sex.   tamsulosin 0.4 MG Caps capsule Commonly known as:  FLOMAX Take 0.4 mg by mouth daily.   traZODone 100 MG tablet Commonly known as:  DESYREL Take 1 tablet (100 mg total) by mouth at bedtime.   Vitamin D3 5000 units Caps Take 1 capsule (5,000 Units total) by mouth daily. For 12 weeks, then start Vitamin D3 2,000 units daily (OTC)       Allergies:  Allergies  Allergen Reactions  . Bee Venom Shortness Of Breath    Family History: Family History  Problem Relation Age of Onset  . Diabetes Mother   . Stroke Father   . Prostate cancer Neg Hx   . Kidney cancer Neg Hx   . Bladder Cancer Neg Hx     Social History:  reports that he has never smoked. His smokeless tobacco use  includes Chew. He reports that he drinks alcohol. He reports that he does  not use drugs.  ROS: UROLOGY Frequent Urination?: No Hard to postpone urination?: No Burning/pain with urination?: No Get up at night to urinate?: No Leakage of urine?: No Urine stream starts and stops?: No Trouble starting stream?: No Do you have to strain to urinate?: Yes Blood in urine?: No Urinary tract infection?: No Sexually transmitted disease?: No Injury to kidneys or bladder?: Yes Painful intercourse?: No Weak stream?: No Erection problems?: No Penile pain?: No  Gastrointestinal Nausea?: No Vomiting?: No Indigestion/heartburn?: No Diarrhea?: No Constipation?: No  Constitutional Fever: No Night sweats?: No Weight loss?: No Fatigue?: No  Skin Skin rash/lesions?: No Itching?: No  Eyes Blurred vision?: No Double vision?: No  Ears/Nose/Throat Sore throat?: No Sinus problems?: No  Hematologic/Lymphatic Swollen glands?: No Easy bruising?: No  Cardiovascular Leg swelling?: No Chest pain?: No  Respiratory Cough?: No Shortness of breath?: No  Endocrine Excessive thirst?: No  Musculoskeletal Back pain?: No Joint pain?: No  Neurological Headaches?: No Dizziness?: No  Psychologic Depression?: No Anxiety?: No  Physical Exam: BP (!) 153/89   Pulse (!) 102   Ht 5\' 9"  (1.753 m)   Wt 183 lb (83 kg)   BMI 27.02 kg/m   Constitutional: Well nourished. Alert and oriented, No acute distress. HEENT: [place] AT, moist mucus membranes. Trachea midline, no masses. Cardiovascular: No clubbing, cyanosis, or edema. Respiratory: Normal respiratory effort, no increased work of breathing. Skin: No rashes, bruises or suspicious lesions. Lymph: No cervical or inguinal adenopathy. Neurologic: Grossly intact, no focal deficits, moving all 4 extremities. Psychiatric: Normal mood and affect.  Laboratory Data: PSA HIstory  1.0 ng/mL in 07/2016  Lab Results  Component Value Date   WBC  6.0 08/29/2016   HGB 11.8 (L) 08/29/2016   HCT 33.6 (L) 08/29/2016   MCV 82.4 08/29/2016   PLT 189 08/29/2016    Lab Results  Component Value Date   CREATININE 1.25 (H) 08/30/2016    Lab Results  Component Value Date   HGBA1C 5.7 (H) 08/29/2016    Lab Results  Component Value Date   TSH 3.78 06/04/2016       Component Value Date/Time   CHOL 178 06/04/2016 0001   HDL 42 06/04/2016 0001   CHOLHDL 4.2 06/04/2016 0001   VLDL 32 (H) 06/04/2016 0001   LDLCALC 104 (H) 06/04/2016 0001    Lab Results  Component Value Date   AST 116 (H) 08/27/2016   Lab Results  Component Value Date   ALT 61 08/27/2016    Assessment & Plan:    1. Urinary retention  - failed two voiding trials - ? Narcotics role  - discussed continuing with an indwelling Foley, SPT placement, CIC and/or undergo cystoscopy to evaluate for possible bladder outlet procedure- patient wishes to CIC while waiting for cystoscopy   - Foley removed today and patient instructed in CIC technique    1. BPH with LUTS  - IPSS score was 3/3  - Continue conservative management, avoiding bladder irritants and timed voiding's  - Continue tamsulosin 0.4 mg daily; patient is also on doxazosin 4 mg daily from cardiologist  - Failed two voiding trials  - Patient caths 3 times daily, for three months due to urinary retention and record PVR's  - undergoing cystoscopy to evaluate for possible BOO  - RTC pending PSA results  2. Erectile dysfunction (Not addressed at this visit)  - SHIM score is 6  - I explained to the patient that in order to achieve an erection it takes good functioning  of the nervous system (parasympathetic, sympathetic, sensory and motor), good blood flow into the erectile tissue of the penis and a desire to have sex  - I explained that conditions like diabetes, hypertension, coronary artery disease, peripheral vascular disease, smoking, alcohol consumption, age, sleep apnea and BPH can diminish the ability  to have an erection  - RTC pending cystoscopy results  Return for cystoscopy to evaluate for BOO.  These notes generated with voice recognition software. I apologize for typographical errors.  Michiel Cowboy, PA-C  [facility] [address] [place], [place] [address] [phone]

## 2016-09-07 NOTE — Assessment & Plan Note (Signed)
Improved, with urinary foley in place currently from discharge hospital Follow-up with Urology [facility] as scheduled this week, further foley management and potential procedural resolution, maybe TURP - Continue Flomax, Cardura for med management

## 2016-09-07 NOTE — Assessment & Plan Note (Signed)
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 Now not taking NSAIDs, and not thought to be related to ATN  Plan: 1. Remain foley catheter in place - follow-up as scheduled this week with Urology [facility] for next step in management with foley vs procedural, possible TURP to resolve outflow obstruction 2. Closely monitor BP outside office - continue on new cardura, hold other anti-HTN meds 3. Follow-up as planned with Nephrology, Urology

## 2016-09-07 NOTE — Assessment & Plan Note (Signed)
Secondary etiology for urinary obstruction Improved, with urinary foley in place currently from discharge hospital Follow-up with Urology [facility] as scheduled this week, further foley management and potential procedural resolution, maybe TURP - Continue Flomax, Cardura for med management

## 2016-09-07 NOTE — Assessment & Plan Note (Signed)
Significant improvement back on Xanax, Trazodone, limited from other options due to CKD. - Refilled Xanax by phone for 2 month supply earlier this week, keep refills on hold at pharmacy if not due yet - Follow-up for dedicated insomnia visit med management with me within 3 months as planned

## 2016-09-07 NOTE — Assessment & Plan Note (Signed)
Resolved from ICU hospitalization with corrected uremia / nephropathy

## 2016-09-07 NOTE — Patient Instructions (Signed)
Thank you for coming in to clinic today.  1. BP improved on re-check - I'm not positive that your pressure is actually running that high - Continue Cardura 4mg  one whole tab daily in morning, instead of half tab twice daily, in future if need increased dose can increase to total dose 8mg  in 24 hours if needed for either BP or urinary flow - Keep monitoring BP outside office, write down readings, if still abnormal can bring BP cuff into Cardiology office our next apt here to have it re-checked and calibrated (can always schedule a NURSE ONLY BP CHECK) - If BP significantly rises >160/100 persistently (technically can do this even if continued 150/90), NEXT med would be add back Amlodipine 5mg  daily to help control this  2. Follow-up with Urology - I will stay tuned on this record to see plan  3. Follow-up as planned with Cardiology in next few months, I will review your results and record  4. Follow-up with Dr Cherylann RatelLateef Nephrology as planned  Check refill status on the Xanax - I Phoned it in to pharmacy yesterday on 09/06/16, see if they can keep refills on file, make sure you follow-up with me while you still have meds and not when you run out  Please schedule a follow-up appointment with Dr. Althea CharonKaramalegos in 2-3 months for follow-up Insomnia / Med Refill / HTN  If you have any other questions or concerns, please feel free to call the clinic or send a message through MyChart. You may also schedule an earlier appointment if necessary.  Saralyn [name] Janit Cutter, DO [facility]

## 2016-09-08 ENCOUNTER — Encounter: Payer: Self-pay | Admitting: Urology

## 2016-09-08 ENCOUNTER — Ambulatory Visit (INDEPENDENT_AMBULATORY_CARE_PROVIDER_SITE_OTHER): Payer: Managed Care, Other (non HMO) | Admitting: Urology

## 2016-09-08 VITALS — BP 153/89 | HR 102 | Ht 69.0 in | Wt 183.0 lb

## 2016-09-08 DIAGNOSIS — N401 Enlarged prostate with lower urinary tract symptoms: Secondary | ICD-10-CM

## 2016-09-08 DIAGNOSIS — R339 Retention of urine, unspecified: Secondary | ICD-10-CM

## 2016-09-08 DIAGNOSIS — N138 Other obstructive and reflux uropathy: Secondary | ICD-10-CM | POA: Diagnosis not present

## 2016-09-08 NOTE — Progress Notes (Signed)
Catheter Removal  Patient is present today for a catheter removal.  5ml of water was drained from the balloon. A 16FR foley cath was removed from the bladder no complications were noted . Patient tolerated well.  Preformed by: Rupert Stackshelsea Watkins, LPN   Continuous Intermittent Catheterization  Due to urinary retention patient is present today for a teaching of self I & O Catheterization. Patient was given detailed verbal and printed instructions of self catheterization. Patient was cleaned and prepped in a sterile fashion.  With instruction and assistance patient inserted a 14FR and urine return was noted 10 ml, urine was yellow in color. Patient tolerated well, no complications were noted Patient was given a sample bag with supplies to take home.  Instructions were given per Carollee HerterShannon for patient to cath 3 times daily.  An order was placed with [employer] for catheters to be sent to the patient's home.   Preformed by: Rupert Stackshelsea Watkins, LPN

## 2016-09-09 ENCOUNTER — Telehealth: Payer: Self-pay

## 2016-09-09 NOTE — Telephone Encounter (Signed)
Pt called stating he was taught CIC yesterday. Pt stated that when he got home and performed CIC he noted some blood when removing the catheter. Reinforced with pt that blood is normal when performing CIC. Reinforced with pt to drink plenty of fluids. Pt voiced understanding.

## 2016-09-22 ENCOUNTER — Ambulatory Visit (INDEPENDENT_AMBULATORY_CARE_PROVIDER_SITE_OTHER): Payer: Managed Care, Other (non HMO) | Admitting: Urology

## 2016-09-22 ENCOUNTER — Encounter: Payer: Self-pay | Admitting: Urology

## 2016-09-22 VITALS — BP 122/78 | HR 99 | Ht 69.0 in | Wt 184.0 lb

## 2016-09-22 DIAGNOSIS — N400 Enlarged prostate without lower urinary tract symptoms: Secondary | ICD-10-CM | POA: Diagnosis not present

## 2016-09-22 DIAGNOSIS — N5201 Erectile dysfunction due to arterial insufficiency: Secondary | ICD-10-CM

## 2016-09-22 LAB — URINALYSIS, COMPLETE
BILIRUBIN UA: NEGATIVE
Glucose, UA: NEGATIVE
Ketones, UA: NEGATIVE
Leukocytes, UA: NEGATIVE
Nitrite, UA: NEGATIVE
PH UA: 5.5 (ref 5.0–7.5)
PROTEIN UA: NEGATIVE
RBC, UA: NEGATIVE
Specific Gravity, UA: 1.015 (ref 1.005–1.030)
Urobilinogen, Ur: 0.2 mg/dL (ref 0.2–1.0)

## 2016-09-22 MED ORDER — CIPROFLOXACIN HCL 500 MG PO TABS
500.0000 mg | ORAL_TABLET | Freq: Once | ORAL | Status: AC
  Administered 2016-09-22: 500 mg via ORAL

## 2016-09-22 MED ORDER — LIDOCAINE HCL 2 % EX GEL
1.0000 "application " | Freq: Once | CUTANEOUS | Status: AC
  Administered 2016-09-22: 1 via URETHRAL

## 2016-09-22 NOTE — Progress Notes (Signed)
   09/22/16  CC:  Chief Complaint  Patient presents with  . Cysto    urinary retention    HPI:  The patient is a 51 year old male who presents today as a follow up from the hospital.  Patient was referred to us by Dr. Althea CharonKaramalegos for urinary retention.  He was seen on 08/23/2016.  He was on tamsulosin 0.4 mg daily and his PVR was minimal at 141 mL.  A week later, he had an accidental overdose of narcotics and [facility]  He was found to be in ARF with creatinine of 7.58.  Foley catheter was placed and remains in place today.  Most recent creatinine was 1.25.    His most recent IPSS score was 3, which is mild lower urinary tract symptomatology. He was mixed with his quality life due to his urinary symptoms.  His PVR was 141 mL.   His major complaints today are intermittency and hesitency.  He has had these symptoms for several months. He denies any dysuria, hematuria or suprapubic pain. He currently taking tamsulosin 0.4 mg daily.  He has had a lapse in therapy.  He has been on the medication for six weeks.  He also denies any recent fevers, chills, nausea or vomiting.  He does not have a family history of PCa.  Has been doing post void CIC with very little output. Voiding well. No complaints currently. Good stream. No straining or hesitancy. Feels like he is emptying.  He presents today for cystoscopic evaluation of bladder neck obstruction.  There were no vitals taken for this visit. NED. A&Ox3.   No respiratory distress   Abd soft, NT, ND Normal phallus with bilateral descended testicles  Cystoscopy Procedure Note  Patient identification was confirmed, informed consent was obtained, and patient was prepped using Betadine solution.  Lidocaine jelly was administered per urethral meatus.    Preoperative abx where received prior to procedure.     Pre-Procedure: - Inspection reveals a normal caliber ureteral meatus.  Procedure: The flexible cystoscope was introduced  without difficulty - No urethral strictures/lesions are present. - Normal prostate Visually nonobstructive prostate. 2-3 cm in length - Normal bladder neck - Bilateral ureteral orifices identified - Bladder mucosa  reveals no ulcers, tumors, or lesions - No bladder stones - No trabeculation  Retroflexion shows no intravesical lobe    Post-Procedure: - Patient tolerated the procedure well  Assessment/ Plan:  1. BPH with history of urinary retention -continue flomax -can hold CIC now due to low volumes -follow up in one month for PVR  2. ED -continue sildenafil prn

## 2016-10-21 ENCOUNTER — Encounter: Payer: Self-pay | Admitting: Urology

## 2016-10-21 ENCOUNTER — Ambulatory Visit (INDEPENDENT_AMBULATORY_CARE_PROVIDER_SITE_OTHER): Payer: Managed Care, Other (non HMO) | Admitting: Urology

## 2016-10-21 VITALS — BP 112/77 | HR 106 | Ht 69.0 in | Wt 187.5 lb

## 2016-10-21 DIAGNOSIS — N400 Enlarged prostate without lower urinary tract symptoms: Secondary | ICD-10-CM | POA: Diagnosis not present

## 2016-10-21 LAB — BLADDER SCAN AMB NON-IMAGING: Scan Result: 0

## 2016-10-21 NOTE — Progress Notes (Signed)
10/21/2016 9:51 AM   Brett Huber [birth year] [id number]  Referring provider: Smitty Cords, DO [address] [place], [place] [address]  Chief Complaint  Patient presents with  . Follow-up    URINARY RETENTION,BPH    HPI: The patient is a 51 year old male who presents today as a follow up from the hospital.  Patient was referred to [place] by Dr. Althea Charon for urinary retention. He was seen on 08/23/2016. He was on tamsulosin 0.4 mg daily and his PVR was minimal at 141 mL. A week later, he had an accidental overdose of narcotics and was admitted to [facility]. He was found to be in ARF with creatinine of 7.58. Foley catheter was placed and remains in place today. Most recent creatinine was 1.25.   His most recent IPSS score was 3, which is mild lower urinary tract symptomatology. He was mixed with his quality life due to his urinary symptoms. His PVR was 141 mL. His major complaints today are intermittency and hesitency. He has had these symptoms for several months. He denies any dysuria, hematuria or suprapubic pain. He currently taking tamsulosin 0.4 mg daily. He has had a lapse in therapy. He has been on the medication for six weeks. He also denies any recent fevers, chills, nausea or vomiting. He does not have a family history of PCa.  Cystoscopy showed a visually nonobstructive prostate dose 2-3 cm in length.  He returns today for PVR which is 0. His I PSS currently is 4/1. He has nocturia 0. Has mild frequency. He has very mild intermittency and urgency. He has no other urinary complaints. He feels that he empties his bladder with a good stream.   PMH: Past Medical History:  Diagnosis Date  . Anxiety   . Chronic pain   . DDD (degenerative disc disease)   . Hyperlipidemia   . Hypertension   . Insomnia   . Sinus congestion     Surgical History: Past Surgical History:  Procedure Laterality Date  . CERVICAL FUSION  2004  . CHOLECYSTECTOMY  2007  .  neck and disc replacement    . SHOULDER ARTHROSCOPY WITH ROTATOR CUFF REPAIR AND SUBACROMIAL DECOMPRESSION Left 01/29/2013   Procedure: LEFT SHOULDER ARTHROSCOPY WITH ROTATOR CUFF REPAIR AND SUBACROMIAL DECOMPRESSION DISTAL CLAVICLE RESECTION;  Surgeon: Mable Paris, MD;  Location: [facility];  Service: Orthopedics;  Laterality: Left;    Home Medications:  Allergies as of 10/21/2016      Reactions   Bee Venom Shortness Of Breath      Medication List       Accurate as of 10/21/16  9:51 AM. Always use your most recent med list.          ALPRAZolam 1 MG tablet Commonly known as:  XANAX TAKE ONE TABLET BY MOUTH AT BEDTIME AS NEEDED FOR SLEEP   doxazosin 4 MG tablet Commonly known as:  CARDURA Take 1 tablet (4 mg total) by mouth daily.   fluticasone 50 MCG/ACT nasal spray Commonly known as:  FLONASE Place 2 sprays into both nostrils daily. Use for 4-6 weeks then stop and use seasonally or as needed.   gabapentin 300 MG capsule Commonly known as:  NEURONTIN Take 300 mg by mouth 4 (four) times daily.   oxyCODONE 15 MG immediate release tablet Commonly known as:  ROXICODONE Take 15 mg by mouth 4 (four) times daily as needed for pain.   sildenafil 20 MG tablet Commonly known as:  REVATIO Take  3-5 pills as needed 30 minute prior to sex.   tamsulosin 0.4 MG Caps capsule Commonly known as:  FLOMAX Take 0.4 mg by mouth daily.   traZODone 100 MG tablet Commonly known as:  DESYREL Take 1 tablet (100 mg total) by mouth at bedtime.   Vitamin D3 5000 units Caps Take 1 capsule (5,000 Units total) by mouth daily. For 12 weeks, then start Vitamin D3 2,000 units daily (OTC)       Allergies:  Allergies  Allergen Reactions  . Bee Venom Shortness Of Breath    Family History: Family History  Problem Relation Age of Onset  . Diabetes Mother   . Stroke Father   . Prostate cancer Neg Hx   . Kidney cancer Neg Hx   . Bladder Cancer Neg Hx     Social  History:  reports that he has never smoked. His smokeless tobacco use includes Chew. He reports that he drinks alcohol. He reports that he does not use drugs.  ROS: UROLOGY Frequent Urination?: No Hard to postpone urination?: No Burning/pain with urination?: No Get up at night to urinate?: No Leakage of urine?: No Urine stream starts and stops?: No Trouble starting stream?: No Do you have to strain to urinate?: No Blood in urine?: No Urinary tract infection?: No Sexually transmitted disease?: No Injury to kidneys or bladder?: Yes Painful intercourse?: No Weak stream?: No Erection problems?: Yes Penile pain?: No  Gastrointestinal Nausea?: No Vomiting?: No Indigestion/heartburn?: No Diarrhea?: No Constipation?: No  Constitutional Fever: No Night sweats?: No Weight loss?: No Fatigue?: No  Skin Skin rash/lesions?: No Itching?: No  Eyes Blurred vision?: No Double vision?: No  Ears/Nose/Throat Sore throat?: No Sinus problems?: No  Hematologic/Lymphatic Swollen glands?: No Easy bruising?: No  Cardiovascular Leg swelling?: No Chest pain?: No  Respiratory Cough?: No Shortness of breath?: No  Endocrine Excessive thirst?: No  Musculoskeletal Back pain?: No Joint pain?: No  Neurological Headaches?: No Dizziness?: No  Psychologic Depression?: No Anxiety?: No  Physical Exam: BP 112/77   Pulse (!) 106   Ht  (1.753 m)   Wt 187 lb 8 oz (85 kg)   BMI 27.69 kg/m   Constitutional:  Alert and oriented, No acute distress. HEENT: [place] AT, moist mucus membranes.  Trachea midline, no masses. Cardiovascular: No clubbing, cyanosis, or edema. Respiratory: Normal respiratory effort, no increased work of breathing. GI: Abdomen is soft, nontender, nondistended, no abdominal masses GU: No CVA tenderness.  Skin: No rashes, bruises or suspicious lesions. Lymph: No cervical or inguinal adenopathy. Neurologic: Grossly intact, no focal deficits, moving all 4  extremities. Psychiatric: Normal mood and affect.  Laboratory Data: Lab Results  Component Value Date   WBC 6.0 08/29/2016   HGB 11.8 (L) 08/29/2016   HCT 33.6 (L) 08/29/2016   MCV 82.4 08/29/2016   PLT 189 08/29/2016    Lab Results  Component Value Date   CREATININE 1.25 (H) 08/30/2016    No results found for: PSA  No results found for: TESTOSTERONE  Lab Results  Component Value Date   HGBA1C 5.7 (H) 08/29/2016    Urinalysis    Component Value Date/Time   COLORURINE YELLOW (A) 08/27/2016 1310   APPEARANCEUR Clear 09/22/2016 0818   LABSPEC 1.011 08/27/2016 1310   PHURINE 5.0 08/27/2016 1310   GLUCOSEU Negative 09/22/2016 0818   HGBUR LARGE (A) 08/27/2016 1310   BILIRUBINUR Negative 09/22/2016 0818   KETONESUR NEGATIVE 08/27/2016 1310   PROTEINUR Negative 09/22/2016 0818   PROTEINUR 30 (A)  08/27/2016 1310   NITRITE Negative 09/22/2016 0818   NITRITE NEGATIVE 08/27/2016 1310   LEUKOCYTESUR Negative 09/22/2016 0818    Assessment & Plan:   1. BPH with history of urinary retention - voiding well now with minimal PVR -continue flomax -follow up annually  2. ED -continue sildenafil prn   Return in about 1 year (around 10/21/2017).  Hildred Laser, MD  [facility] [address] [place], [place] [address] [phone]

## 2016-11-09 ENCOUNTER — Other Ambulatory Visit: Payer: Self-pay | Admitting: Family Medicine

## 2016-11-09 ENCOUNTER — Other Ambulatory Visit: Payer: Managed Care, Other (non HMO)

## 2016-11-09 DIAGNOSIS — F5101 Primary insomnia: Secondary | ICD-10-CM

## 2016-11-09 NOTE — Telephone Encounter (Signed)
LMTCB

## 2016-11-09 NOTE — Telephone Encounter (Signed)
Last seen in office on 09/07/16, phoned in Xanax refill for 2 month supply to allow him to make it to next apt. However, it seems scheduled too far out, now apt on 12/07/16. He is requesting refill again.  Checked [facility] last fill on 10/10/16, he should have 1 more refill available. This should be enough to get him to 12/07/16.  Could you call patient to clarify the following?  1. How many Xanax tablets he has left in current bottle. 2. If he still has 1 refill available on bottle  Let me know and then.Marland Kitchen.Marland Kitchen.If he has enough to last him until 12/07/16 then he does not need a refill and will decline this request. Will plan on printed him new 3 month refill at office visit 6/12.  Otherwise if need can either print or phone in additional short term refill to make it to appointment.  Saralyn [name] Micheline Markes, DO [facility] 11/09/2016, 8:17 AM

## 2016-11-11 NOTE — Telephone Encounter (Signed)
LMTCB

## 2016-11-12 ENCOUNTER — Telehealth: Payer: Self-pay

## 2016-11-12 ENCOUNTER — Encounter: Payer: Self-pay | Admitting: Nurse Practitioner

## 2016-11-12 DIAGNOSIS — F5101 Primary insomnia: Secondary | ICD-10-CM

## 2016-11-12 MED ORDER — ALPRAZOLAM 1 MG PO TABS: 1.0000 mg | ORAL_TABLET | Freq: Every evening | ORAL | 0 refills | Status: DC | PRN

## 2016-11-12 NOTE — Telephone Encounter (Signed)
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

## 2016-11-12 NOTE — Telephone Encounter (Signed)
As per Brett ShamesLauren verbal was given to pharmacy for just 30 days with same direction pt need appointment for further refill,  pt and pharmacy aware and pt has appointment schedule on 06/18.

## 2016-11-29 ENCOUNTER — Inpatient Hospital Stay
Admission: EM | Admit: 2016-11-29 | Discharge: 2016-11-30 | DRG: 871 | Disposition: A | Discharge status: Home / Self Care | Payer: Managed Care, Other (non HMO) | Attending: Internal Medicine | Admitting: Internal Medicine

## 2016-11-29 ENCOUNTER — Encounter: Payer: Self-pay | Admitting: Emergency Medicine

## 2016-11-29 ENCOUNTER — Emergency Department: Payer: Managed Care, Other (non HMO)

## 2016-11-29 DIAGNOSIS — Z981 Arthrodesis status: Secondary | ICD-10-CM | POA: Diagnosis not present

## 2016-11-29 DIAGNOSIS — N400 Enlarged prostate without lower urinary tract symptoms: Secondary | ICD-10-CM | POA: Diagnosis present

## 2016-11-29 DIAGNOSIS — A419 Sepsis, unspecified organism: Secondary | ICD-10-CM | POA: Diagnosis present

## 2016-11-29 DIAGNOSIS — G4700 Insomnia, unspecified: Secondary | ICD-10-CM | POA: Diagnosis present

## 2016-11-29 DIAGNOSIS — Z91030 Bee allergy status: Secondary | ICD-10-CM

## 2016-11-29 DIAGNOSIS — G9341 Metabolic encephalopathy: Secondary | ICD-10-CM | POA: Diagnosis present

## 2016-11-29 DIAGNOSIS — J189 Pneumonia, unspecified organism: Secondary | ICD-10-CM | POA: Diagnosis present

## 2016-11-29 DIAGNOSIS — R4182 Altered mental status, unspecified: Secondary | ICD-10-CM | POA: Diagnosis not present

## 2016-11-29 DIAGNOSIS — F329 Major depressive disorder, single episode, unspecified: Secondary | ICD-10-CM | POA: Diagnosis present

## 2016-11-29 DIAGNOSIS — F17220 Nicotine dependence, chewing tobacco, uncomplicated: Secondary | ICD-10-CM | POA: Diagnosis present

## 2016-11-29 DIAGNOSIS — E785 Hyperlipidemia, unspecified: Secondary | ICD-10-CM | POA: Diagnosis present

## 2016-11-29 DIAGNOSIS — I129 Hypertensive chronic kidney disease with stage 1 through stage 4 chronic kidney disease, or unspecified chronic kidney disease: Secondary | ICD-10-CM | POA: Diagnosis present

## 2016-11-29 DIAGNOSIS — F419 Anxiety disorder, unspecified: Secondary | ICD-10-CM | POA: Diagnosis present

## 2016-11-29 DIAGNOSIS — G8929 Other chronic pain: Secondary | ICD-10-CM | POA: Diagnosis present

## 2016-11-29 DIAGNOSIS — I10 Essential (primary) hypertension: Secondary | ICD-10-CM | POA: Diagnosis present

## 2016-11-29 LAB — CBC
HEMATOCRIT: 39.2 % — AB (ref 40.0–52.0)
HEMOGLOBIN: 13.7 g/dL (ref 13.0–18.0)
MCH: 28.2 pg (ref 26.0–34.0)
MCHC: 35 g/dL (ref 32.0–36.0)
MCV: 80.5 fL (ref 80.0–100.0)
Platelets: 162 10*3/uL (ref 150–440)
RBC: 4.87 MIL/uL (ref 4.40–5.90)
RDW: 15.5 % — ABNORMAL HIGH (ref 11.5–14.5)
WBC: 16.6 10*3/uL — ABNORMAL HIGH (ref 3.8–10.6)

## 2016-11-29 LAB — COMPREHENSIVE METABOLIC PANEL
ALT: 27 U/L (ref 17–63)
ANION GAP: 8 (ref 5–15)
AST: 42 U/L — ABNORMAL HIGH (ref 15–41)
Albumin: 4 g/dL (ref 3.5–5.0)
Alkaline Phosphatase: 53 U/L (ref 38–126)
BUN: 19 mg/dL (ref 6–20)
CHLORIDE: 99 mmol/L — AB (ref 101–111)
CO2: 29 mmol/L (ref 22–32)
Calcium: 9.1 mg/dL (ref 8.9–10.3)
Creatinine, Ser: 1.21 mg/dL (ref 0.61–1.24)
GFR calc non Af Amer: >60 mL/min (ref >60)
Glucose, Bld: 111 mg/dL — ABNORMAL HIGH (ref 65–99)
POTASSIUM: 3.4 mmol/L — AB (ref 3.5–5.1)
SODIUM: 136 mmol/L (ref 135–145)
Total Bilirubin: 1.3 mg/dL — ABNORMAL HIGH (ref 0.3–1.2)
Total Protein: 7.1 g/dL (ref 6.5–8.1)

## 2016-11-29 LAB — URINALYSIS, ROUTINE W REFLEX MICROSCOPIC
Bilirubin Urine: NEGATIVE
Glucose, UA: NEGATIVE mg/dL
HGB URINE DIPSTICK: NEGATIVE
Ketones, ur: 5 mg/dL — AB
Leukocytes, UA: NEGATIVE
Nitrite: NEGATIVE
Protein, ur: NEGATIVE mg/dL
SPECIFIC GRAVITY, URINE: 1.017 (ref 1.005–1.030)
pH: 6 (ref 5.0–8.0)

## 2016-11-29 LAB — LACTIC ACID, PLASMA: LACTIC ACID, VENOUS: 0.8 mmol/L (ref 0.5–1.9)

## 2016-11-29 MED ORDER — ALPRAZOLAM 1 MG PO TABS: 1.0000 mg | ORAL_TABLET | Freq: Every evening | ORAL | Status: DC | PRN

## 2016-11-29 MED ORDER — TAMSULOSIN HCL 0.4 MG PO CAPS
0.4000 mg | ORAL_CAPSULE | Freq: Every day | ORAL | Status: DC
  Administered 2016-11-30: 0.4 mg via ORAL
  Filled 2016-11-29: qty 1

## 2016-11-29 MED ORDER — DEXTROSE 5 % IV SOLN
2.0000 g | INTRAVENOUS | Status: DC
  Administered 2016-11-30: 2 g via INTRAVENOUS
  Filled 2016-11-29 (×2): qty 2

## 2016-11-29 MED ORDER — ONDANSETRON HCL 4 MG PO TABS
4.0000 mg | ORAL_TABLET | Freq: Four times a day (QID) | ORAL | Status: DC | PRN
Start: 2016-11-29 — End: 2016-11-30

## 2016-11-29 MED ORDER — ONDANSETRON HCL 4 MG/2ML IJ SOLN: 4.0000 mg | Freq: Four times a day (QID) | INTRAMUSCULAR | Status: DC | PRN

## 2016-11-29 MED ORDER — GABAPENTIN 300 MG PO CAPS
300.0000 mg | ORAL_CAPSULE | Freq: Four times a day (QID) | ORAL | Status: DC
  Administered 2016-11-29 – 2016-11-30 (×2): 300 mg via ORAL
  Filled 2016-11-29 (×2): qty 1

## 2016-11-29 MED ORDER — AMLODIPINE BESYLATE 5 MG PO TABS
5.0000 mg | ORAL_TABLET | Freq: Every day | ORAL | Status: DC
  Administered 2016-11-30: 5 mg via ORAL
  Filled 2016-11-29: qty 1

## 2016-11-29 MED ORDER — ACETAMINOPHEN 325 MG PO TABS: 650.0000 mg | ORAL_TABLET | Freq: Four times a day (QID) | ORAL | Status: DC | PRN

## 2016-11-29 MED ORDER — SODIUM CHLORIDE 0.9 % IV BOLUS (SEPSIS)
1000.0000 mL | Freq: Once | INTRAVENOUS | Status: AC
  Administered 2016-11-29: 1000 mL via INTRAVENOUS

## 2016-11-29 MED ORDER — BENZONATATE 100 MG PO CAPS: 200.0000 mg | ORAL_CAPSULE | Freq: Three times a day (TID) | ORAL | Status: DC | PRN

## 2016-11-29 MED ORDER — ENOXAPARIN SODIUM 40 MG/0.4ML ~~LOC~~ SOLN
40.0000 mg | SUBCUTANEOUS | Status: DC
  Administered 2016-11-29: 40 mg via SUBCUTANEOUS
  Filled 2016-11-29: qty 0.4

## 2016-11-29 MED ORDER — SODIUM CHLORIDE 0.9 % IV SOLN
Freq: Once | INTRAVENOUS | Status: AC
  Administered 2016-11-29: 18:00:00 via INTRAVENOUS

## 2016-11-29 MED ORDER — GUAIFENESIN-DM 100-10 MG/5ML PO SYRP: 5.0000 mL | ORAL_SOLUTION | ORAL | Status: DC | PRN

## 2016-11-29 MED ORDER — IPRATROPIUM-ALBUTEROL 0.5-2.5 (3) MG/3ML IN SOLN: 3.0000 mL | RESPIRATORY_TRACT | Status: DC | PRN

## 2016-11-29 MED ORDER — SODIUM CHLORIDE 0.9 % IV SOLN
INTRAVENOUS | Status: AC
  Administered 2016-11-29: via INTRAVENOUS

## 2016-11-29 MED ORDER — ACETAMINOPHEN 650 MG RE SUPP
650.0000 mg | Freq: Four times a day (QID) | RECTAL | Status: DC | PRN
Start: 2016-11-29 — End: 2016-11-30

## 2016-11-29 MED ORDER — DOXAZOSIN MESYLATE 2 MG PO TABS
4.0000 mg | ORAL_TABLET | Freq: Every day | ORAL | Status: DC
  Administered 2016-11-30: 4 mg via ORAL
  Filled 2016-11-29: qty 2

## 2016-11-29 MED ORDER — AZITHROMYCIN 500 MG PO TABS
500.0000 mg | ORAL_TABLET | Freq: Every day | ORAL | Status: DC
  Administered 2016-11-30: 500 mg via ORAL
  Filled 2016-11-29: qty 1

## 2016-11-29 MED ORDER — LEVOFLOXACIN IN D5W 750 MG/150ML IV SOLN
750.0000 mg | Freq: Once | INTRAVENOUS | Status: AC
  Administered 2016-11-29: 750 mg via INTRAVENOUS
  Filled 2016-11-29: qty 150

## 2016-11-29 MED ORDER — OXYCODONE HCL 5 MG PO TABS: 10.0000 mg | ORAL_TABLET | Freq: Four times a day (QID) | ORAL | Status: DC | PRN

## 2016-11-29 MED ORDER — TRAZODONE HCL 100 MG PO TABS
100.0000 mg | ORAL_TABLET | Freq: Every day | ORAL | Status: DC
  Administered 2016-11-29: 100 mg via ORAL
  Filled 2016-11-29: qty 1

## 2016-11-29 NOTE — Progress Notes (Signed)
Pharmacy Antibiotic Note  Jettie PaganBradley T Muratore is a 51 y.o. male admitted on 11/29/2016 with pneumonia.  Pharmacy has been consulted for ceftriaxone dosing.  Plan: Will start ceftriaxone 2g IV daily  Height: 5\' 9"  (175.3 cm) Weight: 180 lb (81.6 kg) IBW/kg (Calculated) : 70.7  Temp (24hrs), Avg:100 F (37.8 C), Min:98.9 F (37.2 C), Max:101.3 F (38.5 C)   Recent Labs Lab 11/29/16 1711 11/29/16 1746  WBC 16.6*  --   CREATININE 1.21  --   LATICACIDVEN  --  0.8    Estimated Creatinine Clearance: 71.4 mL/min (by C-G formula based on SCr of 1.21 mg/dL).    Allergies  Allergen Reactions  . Bee Venom Shortness Of Breath     Thank you for allowing pharmacy to be a part of this patient's care.  Thomasene RippleDavid  Maddox Bratcher 11/29/2016 11:42 PM

## 2016-11-29 NOTE — ED Notes (Signed)
Brett HarmanJerimeah Huber- friend Has pts belongings  [phone]

## 2016-11-29 NOTE — ED Triage Notes (Signed)
Boss noted patient altered at work about 2 pm. Patient says he is tired only. Fever noted 101.3. Denies sore throat, cough or other symptoms. Grips equal. Speech slurred.

## 2016-11-29 NOTE — ED Notes (Signed)
Pt states he doesn't know why he is here, states his boss brought him. Louann SjogrenBoss is no longer present in ED. Pt keeps nodding off during assessment. Pt states he is tired, denies pain. Denies drug use. Awakes if you talk to him.

## 2016-11-29 NOTE — H&P (Signed)
[place] [facility] [place] NAME: Brett Huber    MR#:  [id number]  DATE OF BIRTH:  1965-12-23  DATE OF ADMISSION:  11/29/2016  PRIMARY CARE PHYSICIAN: Olin Hauser, DO   REQUESTING/REFERRING PHYSICIAN: Archie Balboa, MD  CHIEF COMPLAINT:   Chief Complaint  Patient presents with  . Altered Mental Status  . Fever    HISTORY OF PRESENT ILLNESS:  Brett Huber  is a 51 y.o. male who presents with lethargy at work. Per the patient he fell asleep at work and was sent to the ED by his employer. Here he was found to have pneumonia, and met sepsis criteria. Hospitalists were called for admission.  PAST MEDICAL HISTORY:   Past Medical History:  Diagnosis Date  . Anxiety   . Chronic pain   . DDD (degenerative disc disease)   . Hyperlipidemia   . Hypertension   . Insomnia   . Sinus congestion     PAST SURGICAL HISTORY:   Past Surgical History:  Procedure Laterality Date  . CERVICAL FUSION  2004  . CHOLECYSTECTOMY  2007  . neck and disc replacement    . SHOULDER ARTHROSCOPY WITH ROTATOR CUFF REPAIR AND SUBACROMIAL DECOMPRESSION Left 01/29/2013   Procedure: LEFT SHOULDER ARTHROSCOPY WITH ROTATOR CUFF REPAIR AND SUBACROMIAL DECOMPRESSION DISTAL CLAVICLE RESECTION;  Surgeon: Nita Sells, MD;  Location: [facility];  Service: Orthopedics;  Laterality: Left;    SOCIAL HISTORY:   Social History  Substance Use Topics  . Smoking status: Never Smoker  . Smokeless tobacco: Current User    Types: Chew  . Alcohol use Yes     Comment: occ    FAMILY HISTORY:   Family History  Problem Relation Age of Onset  . Diabetes Mother   . Stroke Father   . Prostate cancer Neg Hx   . Kidney cancer Neg Hx   . Bladder Cancer Neg Hx     DRUG ALLERGIES:   Allergies  Allergen Reactions  . Bee Venom Shortness Of Breath    MEDICATIONS AT HOME:   Prior to Admission medications   Medication Sig Start Date  End Date Taking? Authorizing Provider  ALPRAZolam Duanne Moron) 1 MG tablet Take 1 tablet (1 mg total) by mouth at bedtime as needed. for sleep 11/12/16 12/12/16 Yes Mikey College, NP  amLODipine (NORVASC) 5 MG tablet Take 5 mg by mouth daily. 11/04/16  Yes [provider]  Cholecalciferol (VITAMIN D3) 5000 units CAPS Take 1 capsule (5,000 Units total) by mouth daily. For 12 weeks, then start Vitamin D3 2,000 units daily (OTC) 06/08/16  Yes Karamalegos, Devonne Doughty, DO  doxazosin (CARDURA) 4 MG tablet Take 1 tablet (4 mg total) by mouth daily. 09/03/16  Yes Gollan, Kathlene November, MD  gabapentin (NEURONTIN) 300 MG capsule Take 300 mg by mouth 4 (four) times daily.    Yes [provider]  tamsulosin (FLOMAX) 0.4 MG CAPS capsule Take 0.4 mg by mouth daily. 09/03/16  Yes Lateef, Munsoor, MD  traZODone (DESYREL) 100 MG tablet Take 1 tablet (100 mg total) by mouth at bedtime. 09/07/16  Yes Karamalegos, Devonne Doughty, DO  fluticasone (FLONASE) 50 MCG/ACT nasal spray Place 2 sprays into both nostrils daily. Use for 4-6 weeks then stop and use seasonally or as needed. 06/04/16   Karamalegos, Devonne Doughty, DO  oxyCODONE (ROXICODONE) 15 MG immediate release tablet Take 15 mg by mouth 4 (four) times daily as needed for pain.    [provider]  Oxycodone HCl 10 MG TABS Take 1 tablet by mouth 4 (four) times daily as needed. 11/26/16   [provider]  sildenafil (REVATIO) 20 MG tablet Take 3-5 pills as needed 30 minute prior to sex. 08/23/16   Nori Riis, PA-C    REVIEW OF SYSTEMS:  Review of Systems  Constitutional: Positive for malaise/fatigue. Negative for chills, fever and weight loss.  HENT: Negative for ear pain, hearing loss and tinnitus.   Eyes: Negative for blurred vision, double vision, pain and redness.  Respiratory: Positive for cough. Negative for hemoptysis and shortness of breath.   Cardiovascular: Negative for chest pain, palpitations, orthopnea and leg swelling.   Gastrointestinal: Negative for abdominal pain, constipation, diarrhea, nausea and vomiting.  Genitourinary: Negative for dysuria, frequency and hematuria.  Musculoskeletal: Negative for back pain, joint pain and neck pain.  Skin:       No acne, rash, or lesions  Neurological: Negative for dizziness, tremors, focal weakness and weakness.  Endo/Heme/Allergies: Negative for polydipsia. Does not bruise/bleed easily.  Psychiatric/Behavioral: Negative for depression. The patient is not nervous/anxious and does not have insomnia.      VITAL SIGNS:   Vitals:   11/29/16 1930 11/29/16 2036 11/29/16 2100 11/29/16 2130  BP: 139/85 104/83 111/67 112/75  Pulse:  (!) 118 (!) 107 (!) 116  Resp: 20 (!) '23 14 16  '$ Temp:      TempSrc:      SpO2:  94% 91% 93%  Weight:      Height:       Wt Readings from Last 3 Encounters:  11/29/16 72.6 kg (160 lb)  10/21/16 85 kg (187 lb 8 oz)  09/22/16 83.5 kg (184 lb)    PHYSICAL EXAMINATION:  Physical Exam  Vitals reviewed. Constitutional: He is oriented to person, place, and time. He appears well-developed and well-nourished. No distress.  HENT:  Head: Normocephalic and atraumatic.  Mouth/Throat: Oropharynx is clear and moist.  Eyes: Conjunctivae and EOM are normal. Pupils are equal, round, and reactive to light. No scleral icterus.  Neck: Normal range of motion. Neck supple. No JVD present. No thyromegaly present.  Cardiovascular: Normal rate, regular rhythm and intact distal pulses.  Exam reveals no gallop and no friction rub.   No murmur heard. Respiratory: Effort normal. No respiratory distress. He has no wheezes. He has no rales.  Coarse breath sounds right midlung field  GI: Soft. Bowel sounds are normal. He exhibits no distension. There is no tenderness.  Musculoskeletal: Normal range of motion. He exhibits no edema.  No arthritis, no gout  Lymphadenopathy:    He has no cervical adenopathy.  Neurological: He is alert and oriented to person,  place, and time. No cranial nerve deficit.  No dysarthria, no aphasia  Skin: Skin is warm and dry. No rash noted. No erythema.  Psychiatric: He has a normal mood and affect. His behavior is normal. Judgment and thought content normal.    LABORATORY PANEL:   CBC  Recent Labs Lab 11/29/16 1711  WBC 16.6*  HGB 13.7  HCT 39.2*  PLT 162   ------------------------------------------------------------------------------------------------------------------  Chemistries   Recent Labs Lab 11/29/16 1711  NA 136  K 3.4*  CL 99*  CO2 29  GLUCOSE 111*  BUN 19  CREATININE 1.21  CALCIUM 9.1  AST 42*  ALT 27  ALKPHOS 53  BILITOT 1.3*   ------------------------------------------------------------------------------------------------------------------  Cardiac Enzymes No results for input(s): TROPONINI in the last 168 hours. ------------------------------------------------------------------------------------------------------------------  RADIOLOGY:  Dg Chest 2  View  Result Date: 11/29/2016 CLINICAL DATA:  Altered mental status with fever EXAM: CHEST  2 VIEW COMPARISON:  08/28/2016 FINDINGS: Cervical spine hardware. Streaky atelectasis at the left base. Patchy right middle lobe infiltrate. Normal heart size. No pneumothorax. IMPRESSION: Patchy right middle lobe infiltrate. Electronically Signed   By: Donavan Foil M.D.   On: 11/29/2016 19:21    EKG:   Orders placed or performed during the hospital encounter of 11/29/16  . ED EKG 12-Lead  . ED EKG 12-Lead  . EKG 12-Lead  . EKG 12-Lead    IMPRESSION AND PLAN:  Principal Problem:   Sepsis ([place]) - lactic acid within normal limits, hemodynamically stable, sepsis due to his pneumonia as below, IV antibiotics and place, cultures sent from the ED. Active Problems:   CAP (community acquired pneumonia) - antibiotics and cultures as above, when necessary antitussive and when necessary DuoNeb's   Hypertension - continue home meds    Anxiety and depression - continue home medications   Hyperlipidemia - home dose anti-lipids   BPH (benign prostatic hyperplasia) - continue home meds  All the records are reviewed and case discussed with ED provider. Management plans discussed with the patient and/or family.  DVT PROPHYLAXIS: SubQ lovenox  GI PROPHYLAXIS: None  ADMISSION STATUS: Inpatient  CODE STATUS: Full Code Status History    Date Active Date Inactive Code Status Order ID Comments User Context   08/27/2016  1:16 PM 08/30/2016  3:04 PM Full Code [id number]  Awilda Bill, NP ED   07/02/2016  6:11 PM 07/05/2016  6:57 PM Full Code [id number]  Epifanio Lesches, MD ED      TOTAL TIME TAKING CARE OF THIS PATIENT: 45 minutes.   Makeyla Govan Elliott 11/29/2016, 10:19 PM  [facility]  Office  [phone]  CC: Primary care physician; Olin Hauser, DO  Note:  This document was prepared using Dragon voice recognition software and may include unintentional dictation errors.

## 2016-11-29 NOTE — ED Provider Notes (Signed)
[facility] Emergency Department Provider Note  ____________________________________________   I have reviewed the triage vital signs and the nursing notes.   HISTORY  Chief Complaint Altered Mental Status and Fever   History limited by: Not Limited   HPI Brett Huber is a 51 y.o. male who presents to the emergency department today that he thinks his altered mental cyanosis likely due to him taking 2 of his pain medications within 1 hour of each other. Patient however does state that he is actually been going down on his narcotic pain prescriptions. He himself states he just felt sleepy. At this time he is feeling better. He had not been aware that he had been febrile. He denies any new pain over his chronic back pain. He denies any nausea or vomiting.   Past Medical History:  Diagnosis Date  . Anxiety   . Chronic pain   . DDD (degenerative disc disease)   . Hyperlipidemia   . Hypertension   . Insomnia   . Sinus congestion     Patient Active Problem List   Diagnosis Date Noted  . Bladder outlet obstruction 08/30/2016  . BPH (benign prostatic hyperplasia) 08/30/2016  . Hematuria 08/30/2016  . Proteinuria 08/30/2016  . Leukocytosis 08/30/2016  . Hyperglycemia 08/30/2016  . Cardiomyopathy (HCC) 08/30/2016  . Hearing loss of left ear 07/19/2016  . Acute on chronic renal failure (HCC) 07/02/2016  . Colon cancer screening 06/04/2016  . Abnormal glucose 06/04/2016  . Hypertension 07/23/2015  . Hyperlipidemia 07/23/2015  . Anxiety and depression 07/23/2015  . Chronic pain 07/23/2015  . Insomnia 07/23/2015  . Erectile dysfunction 07/23/2015  . Vitamin D deficiency 07/23/2015    Past Surgical History:  Procedure Laterality Date  . CERVICAL FUSION  2004  . CHOLECYSTECTOMY  2007  . neck and disc replacement    . SHOULDER ARTHROSCOPY WITH ROTATOR CUFF REPAIR AND SUBACROMIAL DECOMPRESSION Left 01/29/2013   Procedure: LEFT SHOULDER ARTHROSCOPY  WITH ROTATOR CUFF REPAIR AND SUBACROMIAL DECOMPRESSION DISTAL CLAVICLE RESECTION;  Surgeon: Mable ParisJustin William Chandler, MD;  Location: [facility];  Service: Orthopedics;  Laterality: Left;    Prior to Admission medications   Medication Sig Start Date End Date Taking? Authorizing Provider  ALPRAZolam Prudy Feeler(XANAX) 1 MG tablet Take 1 tablet (1 mg total) by mouth at bedtime as needed. for sleep 11/12/16 12/12/16  Galen ManilaKennedy, Lauren Renee, NP  Cholecalciferol (VITAMIN D3) 5000 units CAPS Take 1 capsule (5,000 Units total) by mouth daily. For 12 weeks, then start Vitamin D3 2,000 units daily (OTC) 06/08/16   Karamalegos, Netta NeatAlexander J, DO  doxazosin (CARDURA) 4 MG tablet Take 1 tablet (4 mg total) by mouth daily. 09/03/16   Antonieta IbaGollan, Timothy J, MD  fluticasone (FLONASE) 50 MCG/ACT nasal spray Place 2 sprays into both nostrils daily. Use for 4-6 weeks then stop and use seasonally or as needed. 06/04/16   Karamalegos, Netta NeatAlexander J, DO  gabapentin (NEURONTIN) 300 MG capsule Take 300 mg by mouth 4 (four) times daily.     [provider]  oxyCODONE (ROXICODONE) 15 MG immediate release tablet Take 15 mg by mouth 4 (four) times daily as needed for pain.    [provider]  sildenafil (REVATIO) 20 MG tablet Take 3-5 pills as needed 30 minute prior to sex. 08/23/16   Michiel CowboyMcGowan, Shannon A, PA-C  tamsulosin (FLOMAX) 0.4 MG CAPS capsule Take 0.4 mg by mouth daily. 09/03/16   Cherylann RatelLateef, Munsoor, MD  traZODone (DESYREL) 100 MG tablet Take 1 tablet (100 mg  total) by mouth at bedtime. 09/07/16   Smitty Cords, DO    Allergies Bee venom  Family History  Problem Relation Age of Onset  . Diabetes Mother   . Stroke Father   . Prostate cancer Neg Hx   . Kidney cancer Neg Hx   . Bladder Cancer Neg Hx     Social History Social History  Substance Use Topics  . Smoking status: Never Smoker  . Smokeless tobacco: Current User    Types: Chew  . Alcohol use Yes     Comment: occ    Review of  Systems Constitutional: No fever/chills Eyes: No visual changes. ENT: No sore throat. Cardiovascular: Denies chest pain. Respiratory: Denies shortness of breath. Gastrointestinal: No abdominal pain.  No nausea, no vomiting.  No diarrhea.   Genitourinary: Negative for dysuria. Musculoskeletal: Negative for back pain. Skin: Negative for rash. Neurological: Negative for headaches, focal weakness or numbness.  ____________________________________________   PHYSICAL EXAM:  VITAL SIGNS: ED Triage Vitals  Enc Vitals Group     BP 11/29/16 1707 135/65     Pulse Rate 11/29/16 1707 (!) 122     Resp 11/29/16 1707 20     Temp 11/29/16 1707 (!) 101.3 F (38.5 C)     Temp Source 11/29/16 1707 Oral     SpO2 11/29/16 1707 95 %     Weight 11/29/16 1707 160 lb (72.6 kg)     Height 11/29/16 1707 5\' 9"  (1.753 m)     Head Circumference --      Peak Flow --      Pain Score 11/29/16 1756 6    Constitutional: Alert and oriented. Well appearing and in no distress. Eyes: Conjunctivae are normal.  ENT   Head: Normocephalic and atraumatic.   Nose: No congestion/rhinnorhea.   Mouth/Throat: Mucous membranes are moist.   Neck: No stridor. Hematological/Lymphatic/Immunilogical: No cervical lymphadenopathy. Cardiovascular: Tachycardic, regular rhythm.  No murmurs, rubs, or gallops.  Respiratory: Normal respiratory effort without tachypnea nor retractions. Breath sounds are clear and equal bilaterally. No wheezes/rales/rhonchi. Gastrointestinal: Soft and non tender. No rebound. No guarding.  Genitourinary: Deferred Musculoskeletal: Normal range of motion in all extremities. No lower extremity edema. Neurologic:  Normal speech and language. No gross focal neurologic deficits are appreciated.  Skin:  Skin is warm, dry and intact. No rash noted. Psychiatric: Mood and affect are normal. Speech and behavior are normal. Patient exhibits appropriate insight and  judgment.  ____________________________________________    LABS (pertinent positives/negatives)  Labs Reviewed  COMPREHENSIVE METABOLIC PANEL - Abnormal; Notable for the following:       Result Value   Potassium 3.4 (*)    Chloride 99 (*)    Glucose, Bld 111 (*)    AST 42 (*)    Total Bilirubin 1.3 (*)    All other components within normal limits  CBC - Abnormal; Notable for the following:    WBC 16.6 (*)    HCT 39.2 (*)    RDW 15.5 (*)    All other components within normal limits  URINALYSIS, ROUTINE W REFLEX MICROSCOPIC - Abnormal; Notable for the following:    Color, Urine YELLOW (*)    APPearance CLEAR (*)    Ketones, ur 5 (*)    All other components within normal limits  CULTURE, BLOOD (ROUTINE X 2)  CULTURE, BLOOD (ROUTINE X 2)  LACTIC ACID, PLASMA  LACTIC ACID, PLASMA     ____________________________________________   EKG  I, Phineas Semen, attending physician, personally viewed  and interpreted this EKG  EKG Time: 1757 Rate: 116 Rhythm: sinus tachycardia Axis: normal Intervals: qtc 427 QRS: narrow ST changes: no st elevation Impression: abnormal ekg    ____________________________________________    RADIOLOGY  CXR IMPRESSION: Patchy right middle lobe infiltrate.  ____________________________________________   PROCEDURES  Procedures  ____________________________________________   INITIAL IMPRESSION / ASSESSMENT AND PLAN / ED COURSE  Pertinent labs & imaging results that were available during my care of the patient were reviewed by me and considered in my medical decision making (see chart for details).  Patient presented to the emergency department from work because of concerns for confusion. Initial vital signs here concerning for infection with fever and tachycardia. Patient's chest x-ray was consistent with pneumonia. Will plan on starting IV antibiotics and admission to the hospital  service.  ____________________________________________   FINAL CLINICAL IMPRESSION(S) / ED DIAGNOSES  Final diagnoses:  Community acquired pneumonia, unspecified laterality     Note: This dictation was prepared with Office manager. Any transcriptional errors that result from this process are unintentional     Phineas Semen, MD 11/29/16 2244

## 2016-11-29 NOTE — ED Notes (Signed)
Pt visitor came to desk stating pt needed to urinate. Pt given urinal and informed to sit on side of bed but not to stand up. Pt verbalized understanding. Visitor at bedside with pt. Pt did not want this RN to stay at bedside while urinating.

## 2016-11-29 NOTE — ED Notes (Signed)
CODE  SEPSIS  CALLED  TO  THOMAS  AT  [facility] 

## 2016-11-29 NOTE — ED Notes (Signed)
Pt ambulatory to toilet by self and back.  

## 2016-11-29 NOTE — ED Notes (Signed)
Pt in room 11, cardiac monitor applied.

## 2016-11-29 NOTE — ED Notes (Signed)
Pt states he took two 10mg  oxycodone today 0730 and 10am. Pt takes for degenerative disc disease.

## 2016-11-30 LAB — CBC
HCT: 36.8 % — ABNORMAL LOW (ref 40.0–52.0)
Hemoglobin: 12.6 g/dL — ABNORMAL LOW (ref 13.0–18.0)
MCH: 27.7 pg (ref 26.0–34.0)
MCHC: 34.2 g/dL (ref 32.0–36.0)
MCV: 81 fL (ref 80.0–100.0)
PLATELETS: 168 10*3/uL (ref 150–440)
RBC: 4.54 MIL/uL (ref 4.40–5.90)
RDW: 15.8 % — AB (ref 11.5–14.5)
WBC: 15.7 10*3/uL — AB (ref 3.8–10.6)

## 2016-11-30 LAB — BASIC METABOLIC PANEL
Anion gap: 5 (ref 5–15)
BUN: 12 mg/dL (ref 6–20)
CALCIUM: 8.4 mg/dL — AB (ref 8.9–10.3)
CHLORIDE: 104 mmol/L (ref 101–111)
CO2: 29 mmol/L (ref 22–32)
CREATININE: 0.84 mg/dL (ref 0.61–1.24)
GFR calc Af Amer: >60 mL/min (ref >60)
Glucose, Bld: 99 mg/dL (ref 65–99)
Potassium: 3.4 mmol/L — ABNORMAL LOW (ref 3.5–5.1)
SODIUM: 138 mmol/L (ref 135–145)

## 2016-11-30 MED ORDER — LEVOFLOXACIN 500 MG PO TABS: 500.0000 mg | ORAL_TABLET | Freq: Every day | ORAL | 0 refills | Status: AC

## 2016-11-30 MED ORDER — LEVOFLOXACIN 500 MG PO TABS
500.0000 mg | ORAL_TABLET | Freq: Every day | ORAL | Status: DC
  Administered 2016-11-30: 500 mg via ORAL
  Filled 2016-11-30: qty 1

## 2016-11-30 NOTE — Discharge Instructions (Signed)
[facility] -  at [facility] ° °DIET:  °Regular diet ° °DISCHARGE CONDITION:  °Stable ° °ACTIVITY:  °Activity as tolerated ° °OXYGEN:  °Home Oxygen: No. °  °Oxygen Delivery: room air ° °DISCHARGE LOCATION:  °home  ° ° °ADDITIONAL DISCHARGE INSTRUCTION: ° ° °If you experience worsening of your admission symptoms, develop shortness of breath, life threatening emergency, suicidal or homicidal thoughts you must seek medical attention immediately by calling 911 or calling your MD immediately  if symptoms less severe. ° °You Must read complete instructions/literature along with all the possible adverse reactions/side effects for all the Medicines you take and that have been prescribed to you. Take any new Medicines after you have completely understood and accpet all the possible adverse reactions/side effects.  ° °Please note ° °You were cared for by a hospitalist during your hospital stay. If you have any questions about your discharge medications or the care you received while you were in the hospital after you are discharged, you can call the unit and asked to speak with the hospitalist on call if the hospitalist that took care of you is not available. Once you are discharged, your primary care physician will handle any further medical issues. Please note that NO REFILLS for any discharge medications will be authorized once you are discharged, as it is imperative that you return to your primary care physician (or establish a relationship with a primary care physician if you do not have one) for your aftercare needs so that they can reassess your need for medications and monitor your lab values. ° ° °

## 2016-11-30 NOTE — Discharge Summary (Signed)
[facility] - [place] at [facility]  [name], 51 y.o., DOB 05/04/1966, MRN [id number]. Admission date: 11/29/2016 Discharge Date 11/30/2016 Primary MD Brett CordsKaramalegos, Brett J, DO Admitting Physician Oralia Manisavid Willis, MD  Admission Diagnosis  Community acquired pneumonia, unspecified laterality [J18.9]  Discharge Diagnosis   Principal Problem:   Sepsis (HCC) due to pneumonia Community acquired pneumonia Acute encephalopathy due to pneumonia   Hypertension   Hyperlipidemia   Anxiety and depression   BPH (benign prostatic hyperplasia)            Hospital Course patient is a 51 year old male who presented with lethargy at work he was brought to the emergency room and was noted to have pneumonia. He was admitted for IV antibiotics. This morning patient states that he feels very good and he wants to leave. I explained to him that he may need to stay in the hospital for more day. However patient reports that he will not be able to stay his oxygenating well. And is afebrile currently. I will discharge him home on oral antibiotics.           Consults  None  Significant Tests:  See full reports for all details     Dg Chest 2 View  Result Date: 11/29/2016 CLINICAL DATA:  Altered mental status with fever EXAM: CHEST  2 VIEW COMPARISON:  08/28/2016 FINDINGS: Cervical spine hardware. Streaky atelectasis at the left base. Patchy right middle lobe infiltrate. Normal heart size. No pneumothorax. IMPRESSION: Patchy right middle lobe infiltrate. Electronically Signed   By: Brett PangKim  Huber M.D.   On: 11/29/2016 19:21       Today   Subjective:   Brett Huber  feeling better shortness of breath improved  Objective:   Blood pressure (!) 141/86, pulse 89, temperature 97.8 F (36.6 C), temperature source Oral, resp. rate 20, height 5\' 9"  (1.753 m), weight 180 lb (81.6 kg), SpO2 97 %.  .  Intake/Output Summary (Last 24 hours) at 11/30/16 1313 Last data filed at  11/30/16 0943  Gross per 24 hour  Intake              360 ml  Output             1300 ml  Net             -940 ml    Exam VITAL SIGNS: Blood pressure (!) 141/86, pulse 89, temperature 97.8 F (36.6 C), temperature source Oral, resp. rate 20, height 5\' 9"  (1.753 m), weight 180 lb (81.6 kg), SpO2 97 %.  GENERAL:  51 y.o.-year-old patient lying in the bed with no acute distress.  EYES: Pupils equal, round, reactive to light and accommodation. No scleral icterus. Extraocular muscles intact.  HEENT: Head atraumatic, normocephalic. Oropharynx and nasopharynx clear.  NECK:  Supple, no jugular venous distention. No thyroid enlargement, no tenderness.  LUNGS: Normal breath sounds bilaterally, no wheezing, rales,rhonchi or crepitation. No use of accessory muscles of respiration.  CARDIOVASCULAR: S1, S2 normal. No murmurs, rubs, or gallops.  ABDOMEN: Soft, nontender, nondistended. Bowel sounds present. No organomegaly or mass.  EXTREMITIES: No pedal edema, cyanosis, or clubbing.  NEUROLOGIC: Cranial nerves II through XII are intact. Muscle strength 5/5 in all extremities. Sensation intact. Gait not checked.  PSYCHIATRIC: The patient is alert and oriented x 3.  SKIN: No obvious rash, lesion, or ulcer.   Data Review     CBC w Diff: Lab Results  Component Value Date   WBC 15.7 (H) 11/30/2016  HGB 12.6 (L) 11/30/2016   HCT 36.8 (L) 11/30/2016   PLT 168 11/30/2016   LYMPHOPCT 1 08/27/2016   MONOPCT 5 08/27/2016   EOSPCT 0 08/27/2016   BASOPCT 0 08/27/2016   CMP: Lab Results  Component Value Date   NA 138 11/30/2016   NA 141 08/04/2016   K 3.4 (L) 11/30/2016   CL 104 11/30/2016   CO2 29 11/30/2016   BUN 12 11/30/2016   BUN 22 08/04/2016   CREATININE 0.84 11/30/2016   CREATININE 3.08 (H) 06/04/2016   PROT 7.1 11/29/2016   ALBUMIN 4.0 11/29/2016   BILITOT 1.3 (H) 11/29/2016   ALKPHOS 53 11/29/2016   AST 42 (H) 11/29/2016   ALT 27 11/29/2016  .  Micro Results Recent Results  (from the past 240 hour(s))  Blood Culture (routine x 2)     Status: None (Preliminary result)   Collection Time: 11/29/16  5:46 PM  Result Value Ref Range Status   Specimen Description BLOOD LEFT ANTECUBITAL  Final   Special Requests   Final    BOTTLES DRAWN AEROBIC AND ANAEROBIC Blood Culture adequate volume   Culture NO GROWTH < 12 HOURS  Final   Report Status PENDING  Incomplete  Blood Culture (routine x 2)     Status: None (Preliminary result)   Collection Time: 11/29/16  5:46 PM  Result Value Ref Range Status   Specimen Description BLOOD RIGHT ANTECUBITAL  Final   Special Requests   Final    BOTTLES DRAWN AEROBIC AND ANAEROBIC Blood Culture results may not be optimal due to an excessive volume of blood received in culture bottles   Culture NO GROWTH < 12 HOURS  Final   Report Status PENDING  Incomplete        Code Status Orders        Start     Ordered   11/29/16 2314  Full code  Continuous     11/29/16 2313    Code Status History    Date Active Date Inactive Code Status Order ID Comments User Context   08/27/2016  1:16 PM 08/30/2016  3:04 PM Full Code [id number]  Eugenie Norrie, NP ED   07/02/2016  6:11 PM 07/05/2016  6:57 PM Full Code [id number]  Katha Hamming, MD ED          Follow-up Information    Brett Cords, DO Follow up in 1 week(s).   Specialty:  Family Medicine Contact information: [address] [place] [address] [phone]           Discharge Medications   Allergies as of 11/30/2016      Reactions   Bee Venom Shortness Of Breath      Medication List    TAKE these medications   ALPRAZolam 1 MG tablet Commonly known as:  XANAX Take 1 tablet (1 mg total) by mouth at bedtime as needed. for sleep   amLODipine 5 MG tablet Commonly known as:  NORVASC Take 5 mg by mouth daily.   doxazosin 4 MG tablet Commonly known as:  CARDURA Take 1 tablet (4 mg total) by mouth daily.   fluticasone 50 MCG/ACT nasal spray Commonly  known as:  FLONASE Place 2 sprays into both nostrils daily. Use for 4-6 weeks then stop and use seasonally or as needed.   gabapentin 300 MG capsule Commonly known as:  NEURONTIN Take 300 mg by mouth 4 (four) times daily.   levofloxacin 500 MG tablet Commonly known as:  LEVAQUIN Take  1 tablet (500 mg total) by mouth daily.   Oxycodone HCl 10 MG Tabs Take 1 tablet by mouth 4 (four) times daily as needed.   oxyCODONE 15 MG immediate release tablet Commonly known as:  ROXICODONE Take 15 mg by mouth 4 (four) times daily as needed for pain.   sildenafil 20 MG tablet Commonly known as:  REVATIO Take 3-5 pills as needed 30 minute prior to sex.   tamsulosin 0.4 MG Caps capsule Commonly known as:  FLOMAX Take 0.4 mg by mouth daily.   traZODone 100 MG tablet Commonly known as:  DESYREL Take 1 tablet (100 mg total) by mouth at bedtime.   Vitamin D3 5000 units Caps Take 1 capsule (5,000 Units total) by mouth daily. For 12 weeks, then start Vitamin D3 2,000 units daily (OTC)          Total Time in preparing paper work, data evaluation and todays exam - 35 minutes  Auburn Bilberry M.D on 11/30/2016 at 1:13 PM  [facility]   Office  [phone]

## 2016-11-30 NOTE — Progress Notes (Signed)
[facility] - [place] at [facility]    Brett Huber was admitted to the Hospital on 11/29/2016 and Discharged  11/30/2016 and should be excused from work/school   for 4  days starting 11/29/2016 , may return to work/school without any restrictions. Patient was in hospital with pna.   Call Brett BilberryShreyang Anahis Furgeson MD with questions.  Brett BilberryPATEL, Brett Huber M.D on 11/30/2016,at 10:05 AM  [facility] [place] [facility]    Office  [phone]

## 2016-11-30 NOTE — Progress Notes (Signed)
Discharge and medication instructions given to patient. AVS given. All questions addressed.  Stephannie PetersSusana G Patino Hernandez, RN

## 2016-12-04 LAB — CULTURE, BLOOD (ROUTINE X 2)
CULTURE: NO GROWTH
Culture: NO GROWTH
Special Requests: ADEQUATE

## 2016-12-07 ENCOUNTER — Ambulatory Visit (INDEPENDENT_AMBULATORY_CARE_PROVIDER_SITE_OTHER): Payer: Managed Care, Other (non HMO) | Admitting: Family Medicine

## 2016-12-07 ENCOUNTER — Encounter: Payer: Self-pay | Admitting: Family Medicine

## 2016-12-07 ENCOUNTER — Inpatient Hospital Stay: Payer: Managed Care, Other (non HMO) | Admitting: Family Medicine

## 2016-12-07 VITALS — BP 143/103 | HR 110 | Temp 98.6°F | Resp 16 | Ht 69.0 in | Wt 184.0 lb

## 2016-12-07 DIAGNOSIS — A419 Sepsis, unspecified organism: Secondary | ICD-10-CM

## 2016-12-07 DIAGNOSIS — I10 Essential (primary) hypertension: Secondary | ICD-10-CM | POA: Diagnosis not present

## 2016-12-07 DIAGNOSIS — F5101 Primary insomnia: Secondary | ICD-10-CM

## 2016-12-07 DIAGNOSIS — J189 Pneumonia, unspecified organism: Secondary | ICD-10-CM

## 2016-12-07 DIAGNOSIS — J181 Lobar pneumonia, unspecified organism: Secondary | ICD-10-CM

## 2016-12-07 DIAGNOSIS — N182 Chronic kidney disease, stage 2 (mild): Secondary | ICD-10-CM

## 2016-12-07 MED ORDER — ALPRAZOLAM 1 MG PO TABS: 1.0000 mg | ORAL_TABLET | Freq: Every evening | ORAL | 2 refills | Status: AC | PRN

## 2016-12-07 NOTE — Progress Notes (Signed)
Subjective:    Patient ID: Brett Huber, male    DOB: 07-17-1965, 51 y.o.   MRN: [id number]  Brett Huber is a 51 y.o. male presenting on 12/07/2016 for Pneumonia (hospital follow up)   HPI   HOSPITAL FOLLOW-UP VISIT  Hospital/Location: [facility] Date of Admission: 11/29/16 Date of Discharge: 11/30/16 (patient requested to leave earlier than advised, but he did not leave AMA by report)  Reason for Admission: Acute encephalopathy / fever Primary (+Secondary) Diagnosis: Community Acquired Pneumonia (R-middle lobe) (sepsis 2/2 pneumonia)  FOLLOW-UP PNEUMONIA (CAP) - Hospital H&P and Discharge Summary have been reviewed - Patient presents today about 7 days after recent hospitalization. Brief summary of recent course, patient had symptoms of excessive fatigue (worse than usual) some occasional cough but mostly congestion, fever presented to ED after difficulty at work, found to be septic and had infiltrate on CXR, hospitalized treated with IV antibiotics with improvement. - Today reports overall has done well after discharge. Symptoms of cough and fever have resolved. Fatigue is chronic, see below with insomnia. - New medications on discharge: Levaquin 500mg  daily - finished course - Changes to current meds on discharge: None - Additionally recent hospitalization 08/2016, for similar problem but at that time determined to be more fluid with pulm edema. Had concern with volume status and CKD - Denies any new fevers, chills, productive cough  CHRONIC HTN: Reports mild elevated BP today, prior had been normal in hospital and outside since. States he has not changed meds or no changes on discharge. Last change 08/2016 he resumed Amlodipine 5mg  daily and remained OFF of Olmesartan-HCTZ. Current Meds - Amlodipine 5mg  daily, Doxazosin 4mg  daily   Reports good compliance, took meds today. Tolerating well, w/o complaints. Denies CP, dyspnea, HA, edema, dizziness / lightheadedness  FOLLOW-UP  INSOMNIA, Chronic: - No new concerns. Still tired more recently. He was switched to 1st shift indefinitely at work recently, now adjusting to this, prior chronic history of >7 years on 2nd shift - Still improved on Xanax 1mg  at night, Trazodone. Due for refill Xanax today. Last fill was 11/12/16 after he was given 30 day supply to make it to this apt - Denies any mania symptoms, pain keeping him awake, tremors, nightmares, restless leg syndrome  Chronic Pain - Additional history stating that Pain Management still plans to wean him off opioids, now update with Pain Management reduced Oxy 15 to Oxy 10 PRN takes mostly twice daily, and no longer Percocet   Social History  Substance Use Topics  . Smoking status: Never Smoker  . Smokeless tobacco: Current User    Types: Chew  . Alcohol use Yes     Comment: occ    Review of Systems Per HPI unless specifically indicated above     Objective:    BP (!) 143/103   Pulse (!) 110   Temp 98.6 F (37 C) (Oral)   Resp 16   Ht 5\' 9"  (1.753 m)   Wt 184 lb (83.5 kg)   SpO2 99%   BMI 27.17 kg/m   Wt Readings from Last 3 Encounters:  12/07/16 184 lb (83.5 kg)  11/29/16 180 lb (81.6 kg)  10/21/16 187 lb 8 oz (85 kg)    Physical Exam  Constitutional: He is oriented to person, place, and time. He appears well-developed and well-nourished. No distress.  Stable tired but well-appearing, comfortable, cooperative  HENT:  Head: Normocephalic and atraumatic.  Mouth/Throat: Oropharynx is clear and moist.  Eyes: Conjunctivae are normal.  Cardiovascular: Regular rhythm, normal heart sounds and intact distal pulses.   No murmur heard. Tachycardia, improved on exam  Pulmonary/Chest: Effort normal and breath sounds normal. No respiratory distress. He has no wheezes. He has no rales.  Air movement is good overall except with some mild diminished breath sounds bilateral mid/bases. No focal crackles or rhonchi.  Musculoskeletal: He exhibits no edema.    Neurological: He is alert and oriented to person, place, and time.  Skin: Skin is warm and dry. No rash noted. He is not diaphoretic.  Psychiatric: His behavior is normal.  Nursing note and vitals reviewed.    I have personally reviewed the radiology report from 11/29/16 on Chest X-ray.  CLINICAL DATA:  Altered mental status with fever  EXAM: CHEST  2 VIEW  COMPARISON:  08/28/2016  FINDINGS: Cervical spine hardware. Streaky atelectasis at the left base. Patchy right middle lobe infiltrate. Normal heart size. No pneumothorax.  IMPRESSION: Patchy right middle lobe infiltrate.   Electronically Signed   By: Jasmine Pang M.D.   On: 11/29/2016 19:21      Results for orders placed or performed during the hospital encounter of 11/29/16  Blood Culture (routine x 2)  Result Value Ref Range   Specimen Description BLOOD LEFT ANTECUBITAL    Special Requests      BOTTLES DRAWN AEROBIC AND ANAEROBIC Blood Culture adequate volume   Culture NO GROWTH 5 DAYS    Report Status 12/04/2016 FINAL   Blood Culture (routine x 2)  Result Value Ref Range   Specimen Description BLOOD RIGHT ANTECUBITAL    Special Requests      BOTTLES DRAWN AEROBIC AND ANAEROBIC Blood Culture results may not be optimal due to an excessive volume of blood received in culture bottles   Culture NO GROWTH 5 DAYS    Report Status 12/04/2016 FINAL   Comprehensive metabolic panel  Result Value Ref Range   Sodium 136 135 - 145 mmol/L   Potassium 3.4 (L) 3.5 - 5.1 mmol/L   Chloride 99 (L) 101 - 111 mmol/L   CO2 29 22 - 32 mmol/L   Glucose, Bld 111 (H) 65 - 99 mg/dL   BUN 19 6 - 20 mg/dL   Creatinine, Ser 1.61 0.61 - 1.24 mg/dL   Calcium 9.1 8.9 - 09.6 mg/dL   Total Protein 7.1 6.5 - 8.1 g/dL   Albumin 4.0 3.5 - 5.0 g/dL   AST 42 (H) 15 - 41 U/L   ALT 27 17 - 63 U/L   Alkaline Phosphatase 53 38 - 126 U/L   Total Bilirubin 1.3 (H) 0.3 - 1.2 mg/dL   GFR calc non Af Amer >60 >60 mL/min   GFR calc Af Amer  >60 >60 mL/min   Anion gap 8 5 - 15  CBC  Result Value Ref Range   WBC 16.6 (H) 3.8 - 10.6 K/uL   RBC 4.87 4.40 - 5.90 MIL/uL   Hemoglobin 13.7 13.0 - 18.0 g/dL   HCT 04.5 (L) 40.9 - 81.1 %   MCV 80.5 80.0 - 100.0 fL   MCH 28.2 26.0 - 34.0 pg   MCHC 35.0 32.0 - 36.0 g/dL   RDW 91.4 (H) 78.2 - 95.6 %   Platelets 162 150 - 440 K/uL  Urinalysis, Routine w reflex microscopic  Result Value Ref Range   Color, Urine YELLOW (A) YELLOW   APPearance CLEAR (A) CLEAR   Specific Gravity, Urine 1.017 1.005 - 1.030   pH 6.0 5.0 - 8.0  Glucose, UA NEGATIVE NEGATIVE mg/dL   Hgb urine dipstick NEGATIVE NEGATIVE   Bilirubin Urine NEGATIVE NEGATIVE   Ketones, ur 5 (A) NEGATIVE mg/dL   Protein, ur NEGATIVE NEGATIVE mg/dL   Nitrite NEGATIVE NEGATIVE   Leukocytes, UA NEGATIVE NEGATIVE  Lactic acid, plasma  Result Value Ref Range   Lactic Acid, Venous 0.8 0.5 - 1.9 mmol/L  Basic metabolic panel  Result Value Ref Range   Sodium 138 135 - 145 mmol/L   Potassium 3.4 (L) 3.5 - 5.1 mmol/L   Chloride 104 101 - 111 mmol/L   CO2 29 22 - 32 mmol/L   Glucose, Bld 99 65 - 99 mg/dL   BUN 12 6 - 20 mg/dL   Creatinine, Ser 9.14 0.61 - 1.24 mg/dL   Calcium 8.4 (L) 8.9 - 10.3 mg/dL   GFR calc non Af Amer >60 >60 mL/min   GFR calc Af Amer >60 >60 mL/min   Anion gap 5 5 - 15  CBC  Result Value Ref Range   WBC 15.7 (H) 3.8 - 10.6 K/uL   RBC 4.54 4.40 - 5.90 MIL/uL   Hemoglobin 12.6 (L) 13.0 - 18.0 g/dL   HCT 78.2 (L) 95.6 - 21.3 %   MCV 81.0 80.0 - 100.0 fL   MCH 27.7 26.0 - 34.0 pg   MCHC 34.2 32.0 - 36.0 g/dL   RDW 08.6 (H) 57.8 - 46.9 %   Platelets 168 150 - 440 K/uL      Assessment & Plan:   Problem List Items Addressed This Visit    RESOLVED: Sepsis (HCC)    Resolved, s/p CAP, finished antibiotics      Insomnia    Stable, improved. Now on indefinite 1st shift, gradually improve will take time. Checked [place] CSRS for 1 year Refilled Xanax 1mg  at night [facility] #30 +2 refills      Relevant  Medications   ALPRAZolam (XANAX) 1 MG tablet   Hypertension    Still elevated BP, home readings and hospital report had been better Complication with CKD in past, seems improved Off ARB-HCTZ due to hypotension  Plan: 1. Continue current regimen Amlodipine 5mg , Doxazosin 4mg  daily - consider dose adjust on either if needed - future may try Amlodipine 10mg  if persistent elevated. Remain off ARB, Thiazide for now - defer decision to restart ARB to nephrology 2. Monitor home BP regularly, write down readings, bring to next visit 3. Encourage lifestyle changes 4. Follow-up 6 weeks HTN      CKD (chronic kidney disease), stage II    Resolved. Now Cr normal range 0.8, much improved Continue f/u with Nephrology Control BP Continue Doxazosin with improved voiding Follow-up Cr trend as planned      RESOLVED: CAP (community acquired pneumonia) - Primary    Resolved clinically s/p recent hospitalization, finished levaquin course after discharge. No evidence of focal lung abnormality on exam  Plan: 1. Follow-up 4-6 weeks consider CXR vs CT Chest - given concern recurrent PNA, differential includes vascular congestion or other etiology for pulm findings.         Meds ordered this encounter  Medications  . ALPRAZolam (XANAX) 1 MG tablet    Sig: Take 1 tablet (1 mg total) by mouth at bedtime as needed. for sleep    Dispense:  30 tablet    Refill:  2   I have reviewed the discharge medication list, and have reconciled the current and discharge medications today.   Current Outpatient Prescriptions:  .  ALPRAZolam Prudy Feeler)  1 MG tablet, Take 1 tablet (1 mg total) by mouth at bedtime as needed. for sleep, Disp: 30 tablet, Rfl: 2 .  amLODipine (NORVASC) 5 MG tablet, Take 5 mg by mouth daily., Disp: , Rfl:  .  Cholecalciferol (VITAMIN D3) 5000 units CAPS, Take 1 capsule (5,000 Units total) by mouth daily. For 12 weeks, then start Vitamin D3 2,000 units daily (OTC), Disp: 90 capsule, Rfl: 0 .   doxazosin (CARDURA) 4 MG tablet, Take 1 tablet (4 mg total) by mouth daily., Disp: 30 tablet, Rfl: 6 .  fluticasone (FLONASE) 50 MCG/ACT nasal spray, Place 2 sprays into both nostrils daily. Use for 4-6 weeks then stop and use seasonally or as needed., Disp: 16 g, Rfl: 1 .  gabapentin (NEURONTIN) 300 MG capsule, Take 300 mg by mouth 4 (four) times daily. , Disp: , Rfl:  .  Oxycodone HCl 10 MG TABS, Take 1 tablet by mouth 4 (four) times daily as needed., Disp: , Rfl: 0 .  sildenafil (REVATIO) 20 MG tablet, Take 3-5 pills as needed 30 minute prior to sex., Disp: 50 tablet, Rfl: 3 .  tamsulosin (FLOMAX) 0.4 MG CAPS capsule, Take 0.4 mg by mouth daily., Disp: , Rfl:  .  traZODone (DESYREL) 100 MG tablet, Take 1 tablet (100 mg total) by mouth at bedtime., Disp: 30 tablet, Rfl: 5   Follow up plan: Return in about 6 weeks (around 01/18/2017) for HTN, Sleep, Repeat CXR vs CT.  Saralyn [name] Anise Harbin, DO [facility] 12/08/2016, 1:05 AM

## 2016-12-07 NOTE — Patient Instructions (Addendum)
Thank you for coming to the clinic today.  1. Continue to monitor blood pressure and write down readings - Bring to next visit - Continue Amlodipine 5mg  daily and Doxazosin for now - in future if elevated BP you may increase the Amlodipine to 10mg  (take 2 pills per dose) see how this does, and in future we may continue this or add back ONE of the other medicines, NOT the 25 mg HCTZ  2. Refilled Xanax today - 3 month supply  3. Kidney function improved, continue current plan  We may need repeat Chest X-ray or Chest CT in 4-6 weeks depending on symptoms  Please schedule a Follow-up Appointment to: Return in about 6 weeks (around 01/18/2017) for HTN, Sleep, Repeat CXR vs CT.  If you have any other questions or concerns, please feel free to call the clinic or send a message through MyChart. You may also schedule an earlier appointment if necessary.  Saralyn [name] Luevenia Mcavoy, DO [facility]

## 2016-12-08 NOTE — Assessment & Plan Note (Signed)
Resolved, s/p CAP, finished antibiotics

## 2016-12-08 NOTE — Assessment & Plan Note (Signed)
Resolved clinically s/p recent hospitalization, finished levaquin course after discharge. No evidence of focal lung abnormality on exam  Plan: 1. Follow-up 4-6 weeks consider CXR vs CT Chest - given concern recurrent PNA, differential includes vascular congestion or other etiology for pulm findings.

## 2016-12-08 NOTE — Assessment & Plan Note (Signed)
Resolved. Now Cr normal range 0.8, much improved Continue f/u with Nephrology Control BP Continue Doxazosin with improved voiding Follow-up Cr trend as planned

## 2016-12-08 NOTE — Assessment & Plan Note (Addendum)
Stable, improved. Now on indefinite 1st shift, gradually improve will take time. Checked [place] CSRS for 1 year Refilled Xanax 1mg  at night [facility] #30 +2 refills

## 2016-12-08 NOTE — Assessment & Plan Note (Signed)
Still elevated BP, home readings and hospital report had been better Complication with CKD in past, seems improved Off ARB-HCTZ due to hypotension  Plan: 1. Continue current regimen Amlodipine 5mg , Doxazosin 4mg  daily - consider dose adjust on either if needed - future may try Amlodipine 10mg  if persistent elevated. Remain off ARB, Thiazide for now - defer decision to restart ARB to nephrology 2. Monitor home BP regularly, write down readings, bring to next visit 3. Encourage lifestyle changes 4. Follow-up 6 weeks HTN

## 2016-12-16 ENCOUNTER — Other Ambulatory Visit: Payer: Managed Care, Other (non HMO)

## 2016-12-20 ENCOUNTER — Other Ambulatory Visit: Payer: Self-pay | Admitting: Cardiovascular Disease

## 2016-12-20 DIAGNOSIS — I420 Dilated cardiomyopathy: Secondary | ICD-10-CM

## 2017-01-03 ENCOUNTER — Other Ambulatory Visit: Payer: Self-pay

## 2017-01-03 ENCOUNTER — Ambulatory Visit (INDEPENDENT_AMBULATORY_CARE_PROVIDER_SITE_OTHER): Payer: Managed Care, Other (non HMO)

## 2017-01-03 DIAGNOSIS — I420 Dilated cardiomyopathy: Secondary | ICD-10-CM | POA: Diagnosis not present

## 2017-04-05 ENCOUNTER — Other Ambulatory Visit: Payer: Self-pay | Admitting: Family Medicine

## 2017-04-05 DIAGNOSIS — F5101 Primary insomnia: Secondary | ICD-10-CM

## 2017-04-06 NOTE — Telephone Encounter (Signed)
Pt advised to schedule an appointment and understand this is short term refill.

## 2017-04-06 NOTE — Telephone Encounter (Signed)
Reviewed previous note. Given patient's chronic use of Alprazolam I have agreed to refill short term only as needed, so that he can schedule office visit.  Printed rx Alprazolam  tabs take 1 at bedtime PRN sleep, #30, 0 refills for 30 day supply only, no more refills until office visit.  Sent refill Trazodone  nightly 30 +5 refills  Please notify patient that refills were sent / faxed to [facility] pharmacy in [place] and that he should schedule office visit within next 2-3 weeks for continued refills on Alprazolam.  Saralyn Pilar, DO [facility] 04/06/2017, 12:32 PM

## 2017-04-06 NOTE — Telephone Encounter (Signed)
Pt requesting refills for Trazodone, Alprazolam. His last visit in June states, "Return in about 6 weeks (around 01/18/2017) for HTN, Sleep, Repeat CXR vs CT. Please advise.

## 2017-05-06 ENCOUNTER — Encounter: Payer: Self-pay | Admitting: Family Medicine

## 2017-05-06 ENCOUNTER — Ambulatory Visit (INDEPENDENT_AMBULATORY_CARE_PROVIDER_SITE_OTHER): Payer: Managed Care, Other (non HMO) | Admitting: Family Medicine

## 2017-05-06 VITALS — BP 135/88 | HR 94 | Temp 97.9°F | Resp 16 | Ht 69.0 in | Wt 193.0 lb

## 2017-05-06 DIAGNOSIS — R7309 Other abnormal glucose: Secondary | ICD-10-CM

## 2017-05-06 DIAGNOSIS — F419 Anxiety disorder, unspecified: Secondary | ICD-10-CM | POA: Diagnosis not present

## 2017-05-06 DIAGNOSIS — E782 Mixed hyperlipidemia: Secondary | ICD-10-CM

## 2017-05-06 DIAGNOSIS — E559 Vitamin D deficiency, unspecified: Secondary | ICD-10-CM

## 2017-05-06 DIAGNOSIS — E538 Deficiency of other specified B group vitamins: Secondary | ICD-10-CM | POA: Diagnosis not present

## 2017-05-06 DIAGNOSIS — Z23 Encounter for immunization: Secondary | ICD-10-CM | POA: Diagnosis not present

## 2017-05-06 DIAGNOSIS — F5101 Primary insomnia: Secondary | ICD-10-CM | POA: Diagnosis not present

## 2017-05-06 DIAGNOSIS — Z0000 Encounter for general adult medical examination without abnormal findings: Secondary | ICD-10-CM

## 2017-05-06 DIAGNOSIS — Z1211 Encounter for screening for malignant neoplasm of colon: Secondary | ICD-10-CM

## 2017-05-06 DIAGNOSIS — N320 Bladder-neck obstruction: Secondary | ICD-10-CM | POA: Diagnosis not present

## 2017-05-06 DIAGNOSIS — F329 Major depressive disorder, single episode, unspecified: Secondary | ICD-10-CM

## 2017-05-06 DIAGNOSIS — F32A Depression, unspecified: Secondary | ICD-10-CM

## 2017-05-06 DIAGNOSIS — N400 Enlarged prostate without lower urinary tract symptoms: Secondary | ICD-10-CM

## 2017-05-06 DIAGNOSIS — I10 Essential (primary) hypertension: Secondary | ICD-10-CM | POA: Diagnosis not present

## 2017-05-06 MED ORDER — ALPRAZOLAM 1 MG PO TABS: 1.0000 mg | ORAL_TABLET | Freq: Every evening | ORAL | 2 refills | Status: DC | PRN

## 2017-05-06 MED ORDER — AMLODIPINE BESYLATE 10 MG PO TABS: 10.0000 mg | ORAL_TABLET | Freq: Every day | ORAL | 3 refills | Status: DC

## 2017-05-06 MED ORDER — TRAZODONE HCL 100 MG PO TABS: 100.0000 mg | ORAL_TABLET | Freq: Every day | ORAL | 3 refills | Status: DC

## 2017-05-06 NOTE — Progress Notes (Signed)
Subjective:    Patient ID: Brett Huber, male    DOB: Apr 29, 1966, 51 y.o.   MRN: [id number]  Brett Huber is a 51 y.o. male presenting on 05/06/2017 for Annual Exam   HPI    Here for Annual Physical Exam, and will be due for labwork within 1 month.  FOLLOW-UP Urinary Retention (Resolved AKI vs CKD) / Chronic Pain - Reports that he continues to take Flomax once daily with very good results, he has since discontinued Doxazosin in interval since it was not helping. Also he spoke with his prior pain management provider Dr Sanjuan Dame ([facility] in [place]), and thought some of his urinary retention could have been from the higher dose opiates, was on Oxycodone '15mg'$  in past, he has weaned off opioids completely since Auust 2018 and overall has done very well on this and states the pain is manageable, no longer followed by pain management, and pleased to not be on higher risk combination of opioid and benzo  CHRONIC HTN: Reports home BP readings now improved 130s/80s on double dose Amlodipine for past 1 week Current Meds - Amlodipine '10mg'$  (taking x 2 of the '5mg'$  tabs as advised) Reports good compliance, took meds today. Tolerating well, w/o complaints.  Elevated A1c / Abnormal Glucose Reports no new concerns. Prior reading A1c 5.7 (08/2016) Meds: never on meds  FOLLOW-UP INSOMNIA, Chronic: - Last visit with me 12/07/16, for same problem, treated with continued Xanax and Trazodone rx, see prior notes for background information. - Interval update with still doing very well on this regimen - Today patient reports he feels great, and would like to continue Xanax '1mg'$  nightly and Trazodone '100mg'$  nightly as well. Due for rx refills today - He is able to sleep 7 hours most nights, and also notices feels improved off opioid therapy, able to fall asleep within 30-45min of laying down - He was switched to 1st shift indefinitely at work >3-4 months ago, adjusting to this, prior chronic history of >7  years on 2nd shift - Denies any mania symptoms, pain keeping him awake, tremors, nightmares, restless leg syndrome  HYPERLIPIDEMIA: - Reports no concerns. Last lipid panel 05/2016, mostly controlled mild elevated LDL and TG - Never on cholesterol medication. Believes he may have taken one in past.  Health Maintenance: - Due for Flu Shot, will receive today - Colon CA Screening: Never had colonoscopy. Currently asymptomatic. No known family history of colon CA. Due for screening test considering Cologuard, counseling given he has an old cologuard kit at home from >1 year ago never sent, still sealed will check with Cologuard  Depression screen [facility] 2/9 05/06/2017 06/04/2016 12/02/2015  Decreased Interest 0 0 1  Down, Depressed, Hopeless 0 0 0  PHQ - 2 Score 0 0 1  Altered sleeping 0 - 3  Tired, decreased energy 0 - 2  Change in appetite 0 - 0  Feeling bad or failure about yourself  0 - 0  Trouble concentrating 0 - 0  Moving slowly or fidgety/restless 0 - 1  Suicidal thoughts 0 - 0  PHQ-9 Score 0 - 7  Difficult doing work/chores Not difficult at all - -   No flowsheet data found.   Past Medical History:  Diagnosis Date  . Anxiety   . Chronic pain   . DDD (degenerative disc disease)   . Hyperlipidemia   . Hypertension   . Insomnia   . Sinus congestion    Past Surgical History:  Procedure Laterality Date  .  CERVICAL FUSION  2004  . CHOLECYSTECTOMY  2007  . neck and disc replacement     Social History   Socioeconomic History  . Marital status: Married    Spouse name: Not on file  . Number of children: Not on file  . Years of education: Not on file  . Highest education level: Not on file  Social Needs  . Financial resource strain: Not on file  . Food insecurity - worry: Not on file  . Food insecurity - inability: Not on file  . Transportation needs - medical: Not on file  . Transportation needs - non-medical: Not on file  Occupational History  . Not on file  Tobacco  Use  . Smoking status: Never Smoker  . Smokeless tobacco: Current User    Types: Chew  Substance and Sexual Activity  . Alcohol use: Yes    Comment: occ  . Drug use: No  . Sexual activity: Not on file  Other Topics Concern  . Not on file  Social History Narrative  . Not on file   Family History  Problem Relation Age of Onset  . Diabetes Mother   . Stroke Father   . Prostate cancer Neg Hx   . Kidney cancer Neg Hx   . Bladder Cancer Neg Hx    Current Outpatient Medications on File Prior to Visit  Medication Sig  . Cholecalciferol (VITAMIN D3) 5000 units CAPS Take 1 capsule (5,000 Units total) by mouth daily. For 12 weeks, then start Vitamin D3 2,000 units daily (OTC)  . fluticasone (FLONASE) 50 MCG/ACT nasal spray Place 2 sprays into both nostrils daily. Use for 4-6 weeks then stop and use seasonally or as needed.  . tamsulosin (FLOMAX) 0.4 MG CAPS capsule Take 0.4 mg by mouth daily.  . sildenafil (REVATIO) 20 MG tablet Take 3-5 pills as needed 30 minute prior to sex. (Patient not taking: Reported on 05/06/2017)   No current facility-administered medications on file prior to visit.     Review of Systems  Constitutional: Negative for activity change, appetite change, chills, diaphoresis, fatigue, fever and unexpected weight change.  HENT: Negative for congestion, hearing loss and sinus pressure.   Eyes: Negative for visual disturbance.  Respiratory: Negative for apnea, cough, choking, chest tightness, shortness of breath and wheezing.   Cardiovascular: Negative for chest pain, palpitations and leg swelling.  Gastrointestinal: Negative for abdominal pain, anal bleeding, blood in stool, constipation, diarrhea, nausea and vomiting.  Endocrine: Negative for cold intolerance.  Genitourinary: Negative for decreased urine volume, difficulty urinating, dysuria, frequency, hematuria, testicular pain and urgency.  Musculoskeletal: Positive for arthralgias (Occasional, neck pain and back,  overall remains improved). Negative for neck pain.  Skin: Negative for rash.  Allergic/Immunologic: Negative for environmental allergies.  Neurological: Negative for dizziness, weakness, light-headedness, numbness and headaches.  Hematological: Negative for adenopathy.  Psychiatric/Behavioral: Negative for agitation, behavioral problems, decreased concentration, dysphoric mood and sleep disturbance (Improved on meds). The patient is not nervous/anxious.    Per HPI unless specifically indicated above     Objective:    BP 135/88   Pulse 94   Temp 97.9 F (36.6 C) (Oral)   Resp 16   Ht '5\' 9"'$  (1.753 m)   Wt 193 lb (87.5 kg)   BMI 28.50 kg/m   Wt Readings from Last 3 Encounters:  05/06/17 193 lb (87.5 kg)  12/07/16 184 lb (83.5 kg)  11/29/16 180 lb (81.6 kg)    Physical Exam  Constitutional: He is oriented to  person, place, and time. He appears well-developed and well-nourished. No distress.  Well-appearing no longer tired appearing, comfortable, cooperative  HENT:  Head: Normocephalic and atraumatic.  Mouth/Throat: Oropharynx is clear and moist.  Frontal / maxillary sinuses non-tender. Nares patent without purulence or edema. Bilateral TMs clear without erythema, effusion or bulging. Oropharynx clear without erythema, exudates, edema or asymmetry.  Eyes: Conjunctivae and EOM are normal. Pupils are equal, round, and reactive to light. Right eye exhibits no discharge. Left eye exhibits no discharge.  Neck: Normal range of motion. Neck supple. No thyromegaly present.  Cardiovascular: Normal rate, regular rhythm, normal heart sounds and intact distal pulses.  No murmur heard. Pulmonary/Chest: Effort normal and breath sounds normal. No respiratory distress. He has no wheezes. He has no rales.  Abdominal: Soft. Bowel sounds are normal. He exhibits no distension and no mass. There is no tenderness.  Musculoskeletal: Normal range of motion. He exhibits no edema or tenderness.  Upper /  Lower Extremities: - Normal muscle tone, strength bilateral upper extremities 5/5, lower extremities 5/5  Lymphadenopathy:    He has no cervical adenopathy.  Neurological: He is alert and oriented to person, place, and time.  Distal sensation intact to light touch all extremities  Skin: Skin is warm and dry. No rash noted. He is not diaphoretic. No erythema.  Psychiatric: He has a normal mood and affect. His behavior is normal.  Well groomed, good eye contact, normal speech and thoughts  Nursing note and vitals reviewed.  Results for orders placed or performed during the hospital encounter of 11/29/16  Blood Culture (routine x 2)  Result Value Ref Range   Specimen Description BLOOD LEFT ANTECUBITAL    Special Requests      BOTTLES DRAWN AEROBIC AND ANAEROBIC Blood Culture adequate volume   Culture NO GROWTH 5 DAYS    Report Status 12/04/2016 FINAL   Blood Culture (routine x 2)  Result Value Ref Range   Specimen Description BLOOD RIGHT ANTECUBITAL    Special Requests      BOTTLES DRAWN AEROBIC AND ANAEROBIC Blood Culture results may not be optimal due to an excessive volume of blood received in culture bottles   Culture NO GROWTH 5 DAYS    Report Status 12/04/2016 FINAL   Comprehensive metabolic panel  Result Value Ref Range   Sodium 136 135 - 145 mmol/L   Potassium 3.4 (L) 3.5 - 5.1 mmol/L   Chloride 99 (L) 101 - 111 mmol/L   CO2 29 22 - 32 mmol/L   Glucose, Bld 111 (H) 65 - 99 mg/dL   BUN 19 6 - 20 mg/dL   Creatinine, Ser 1.21 0.61 - 1.24 mg/dL   Calcium 9.1 8.9 - 10.3 mg/dL   Total Protein 7.1 6.5 - 8.1 g/dL   Albumin 4.0 3.5 - 5.0 g/dL   AST 42 (H) 15 - 41 U/L   ALT 27 17 - 63 U/L   Alkaline Phosphatase 53 38 - 126 U/L   Total Bilirubin 1.3 (H) 0.3 - 1.2 mg/dL   GFR calc non Af Amer >60 >60 mL/min   GFR calc Af Amer >60 >60 mL/min   Anion gap 8 5 - 15  CBC  Result Value Ref Range   WBC 16.6 (H) 3.8 - 10.6 K/uL   RBC 4.87 4.40 - 5.90 MIL/uL   Hemoglobin 13.7 13.0 -  18.0 g/dL   HCT 39.2 (L) 40.0 - 52.0 %   MCV 80.5 80.0 - 100.0 fL   MCH 28.2 26.0 -  34.0 pg   MCHC 35.0 32.0 - 36.0 g/dL   RDW 15.5 (H) 11.5 - 14.5 %   Platelets 162 150 - 440 K/uL  Urinalysis, Routine w reflex microscopic  Result Value Ref Range   Color, Urine YELLOW (A) YELLOW   APPearance CLEAR (A) CLEAR   Specific Gravity, Urine 1.017 1.005 - 1.030   pH 6.0 5.0 - 8.0   Glucose, UA NEGATIVE NEGATIVE mg/dL   Hgb urine dipstick NEGATIVE NEGATIVE   Bilirubin Urine NEGATIVE NEGATIVE   Ketones, ur 5 (A) NEGATIVE mg/dL   Protein, ur NEGATIVE NEGATIVE mg/dL   Nitrite NEGATIVE NEGATIVE   Leukocytes, UA NEGATIVE NEGATIVE  Lactic acid, plasma  Result Value Ref Range   Lactic Acid, Venous 0.8 0.5 - 1.9 mmol/L  Basic metabolic panel  Result Value Ref Range   Sodium 138 135 - 145 mmol/L   Potassium 3.4 (L) 3.5 - 5.1 mmol/L   Chloride 104 101 - 111 mmol/L   CO2 29 22 - 32 mmol/L   Glucose, Bld 99 65 - 99 mg/dL   BUN 12 6 - 20 mg/dL   Creatinine, Ser 0.84 0.61 - 1.24 mg/dL   Calcium 8.4 (L) 8.9 - 10.3 mg/dL   GFR calc non Af Amer >60 >60 mL/min   GFR calc Af Amer >60 >60 mL/min   Anion gap 5 5 - 15  CBC  Result Value Ref Range   WBC 15.7 (H) 3.8 - 10.6 K/uL   RBC 4.54 4.40 - 5.90 MIL/uL   Hemoglobin 12.6 (L) 13.0 - 18.0 g/dL   HCT 36.8 (L) 40.0 - 52.0 %   MCV 81.0 80.0 - 100.0 fL   MCH 27.7 26.0 - 34.0 pg   MCHC 34.2 32.0 - 36.0 g/dL   RDW 15.8 (H) 11.5 - 14.5 %   Platelets 168 150 - 440 K/uL      Assessment & Plan:   Problem List Items Addressed This Visit    Abnormal glucose    Prior Elevated A1c 5.6 to 5.7, no new dx PreDM at this time  Plan:  1. Not on any therapy currently  2. Encourage improved lifestyle - low carb, low sugar diet, reduce portion size, continue improving regular exercise 3. 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 Continue Flomax 0.'4mg'$  daily 2. Follow-up with urology as planned      Colon cancer screening    Check for existing cologuard kit at home and contact company Proceed with cologuard or get new kit, follow-up as needed      Hyperlipidemia    Mostly controlled cholesterol on lifestyle Last lipid panel 05/2016  Plan: 1. Due repeat lipids - future order  placed 1 month 2. Encourage improved lifestyle - low carb/cholesterol, reduce portion size, continue improving regular exercise 3. Follow-up       Relevant Medications   amLODipine (NORVASC) 10 MG tablet   Other Relevant Orders   Lipid panel   Hypertension    Controlled HTN on higher dose Amlodipine - Home BP readings normal  No known complications - Prior history of hypotension and CKD due to multiple factors that have resolved   Plan:  1. Continue current BP regimen - increase Amlodipine from 5 to '10mg'$  daily as he is currently taking - new rx '10mg'$  tabs 2. Encourage improved lifestyle - low sodium diet, regular exercise 3. Continue monitor BP outside office, bring readings to next visit, if persistently >140/90 or new symptoms notify office sooner 4. Follow-up 3 months, labs within 1 month      Relevant Medications   amLODipine (NORVASC) 10 MG tablet   Other Relevant Orders   Comprehensive metabolic panel   Insomnia    Stable, improved now mostly  controlled and normal sleep on current meds - Remains on 1st shift Checked [place] CSRS for 2 years, appropriate with last fill Xanax 04/06/17, and last fill opiates from prior pain management Dr Sanjuan Dame 02/2017  Plan: 1. Encouraged that he is remaining off opioids now, successfully tapered off in August 2018, appropriate to continue BDZ long-term if controlling insomnia 2. Revieiwed and signed [facility] Controlled Contract for Xanax good for 1 year, reviewed prior UDS in system already 3. Refilled Xanax '1mg'$  at night QHS PRN #30 +2 refills for 3 month supply 4. Refilled Trazodone '100mg'$  nightly 90 day supply 5. Follow-up 3 months for med refills - can extend to q 6 mo in future if remains stable      Relevant Medications   ALPRAZolam (XANAX) 1 MG tablet   traZODone (DESYREL) 100 MG tablet   Vitamin D deficiency    Due to re-check Vitamin D with upcoming labs Adjust vitamin D supplement      Relevant Orders   VITAMIN D 25 Hydroxy (Vit-D Deficiency, Fractures)    Other Visit Diagnoses    Annual physical exam    -  Primary   Relevant Orders   Comprehensive metabolic panel   Lipid panel   CBC with Differential/Platelet   Hemoglobin A1c   Needs flu shot       Relevant Orders   Flu Vaccine QUAD 36+ mos IM (Completed)   Vitamin B12 deficiency       Relevant Orders   Vitamin B12      Meds ordered this encounter  Medications  . DISCONTD: amLODipine (NORVASC) 10 MG tablet    Sig: Take 1 tablet (10 mg total) daily by mouth.    Dispense:  90 tablet    Refill:  3  . ALPRAZolam (XANAX) 1 MG tablet    Sig: Take 1 tablet (1 mg total) at bedtime as needed by mouth. For insomnia    Dispense:  30 tablet    Refill:  2  . traZODone (DESYREL) 100 MG tablet    Sig: Take 1 tablet (100 mg total) at bedtime by mouth.    Dispense:  90 tablet    Refill:  3    90 day supply  . DISCONTD: amLODipine (NORVASC) 10 MG tablet    Sig: Take 1 tablet (10 mg total) daily by mouth.    Dispense:  90 tablet     Refill:  3  . amLODipine (NORVASC) 10 MG tablet  Sig: Take 1 tablet (10 mg total) daily by mouth.    Dispense:  90 tablet    Refill:  3    Follow up plan: Return in about 3 months (around 08/06/2017) for Insomnia med refills.  Orders printed for future labs for [facility] within 1 month, given to patient.  Nobie Putnam, DO [facility] 05/07/2017, 9:05 AM

## 2017-05-06 NOTE — Patient Instructions (Addendum)
Thank you for coming to the clinic today.  1. Refilled Amlodpine now at '10mg'$  once daily - keep track of BP  2. Refilled Xanax '1mg'$  nightly for insomnia, given 3 month supply   Signed contract today for 1 year continue this treatment  Refilled Trazodone '100mg'$  nightly 90 day supply  You can stop Vitamin D3 5,000 daily for now and hold Vitamin B12 We will check labs - with Vitamin D an B12 and let you know most likely if D is normal, you can take a LOWER dose at Vitamin D3 1,000 to 2,000 iu daily  Congratulations on discontinuing the opioid therapy  3.  Colon Cancer Screening: - For all adults age 53+ routine colon cancer screening is highly recommended.     - Recent guidelines from [place] recommend starting age of 48 - Early detection of colon cancer is important, because often there are no warning signs or symptoms, also if found early usually it can be cured. Late stage is hard to treat.  - If you are not interested in Colonoscopy screening (if done and normal you could be cleared for 5 to 10 years until next due), then Cologuard is an excellent alternative for screening test for Colon Cancer. It is highly sensitive for detecting DNA of colon cancer from even the earliest stages. Also, there is NO bowel prep required. - If Cologuard is NEGATIVE, then it is good for 3 years before next due - If Cologuard is POSITIVE, then it is strongly advised to get a Colonoscopy, which allows the GI doctor to locate the source of the cancer or polyp (even very early stage) and treat it by removing it. ------------------------- If you would like to proceed with Cologuard (stool DNA test) - FIRST, call your insurance company and tell them you want to check cost of Cologuard tell them CPT Code [id number] (it may be completely covered and you could get for no cost, OR max cost without any coverage is about $600). Also, keep in mind if you do NOT open the kit, and decide not to do the test, you will  NOT be charged, you should contact the company if you decide not to do the test. - If you want to proceed, you can notify us (phone message, [place], or at next visit) and we will order it for you. The test kit will be delivered to you house within about 1 week. Follow instructions to collect sample, you may call the company for any help or questions, 24/7 telephone support at [phone].  Please schedule a Follow-up Appointment to: Return in about 3 months (around 08/06/2017) for Insomnia med refills.  If you have any other questions or concerns, please feel free to call the clinic or send a message through [place]. You may also schedule an earlier appointment if necessary.  Additionally, you may be receiving a survey about your experience at our clinic within a few days to 1 week by e-mail or mail. We value your feedback.  Nobie Putnam, DO [place]

## 2017-05-07 NOTE — Assessment & Plan Note (Signed)
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

## 2017-05-07 NOTE — Assessment & Plan Note (Signed)
Controlled with dramatic improvement with better sleep and insomnia control No longer on SSRI TCA or other agent - Continue Xanax nightly and Trazodone nightly

## 2017-05-07 NOTE — Assessment & Plan Note (Signed)
Due to re-check Vitamin D with upcoming labs Adjust vitamin D supplement

## 2017-05-07 NOTE — Assessment & Plan Note (Signed)
Check for existing cologuard kit at home and contact company Proceed with cologuard or get new kit, follow-up as needed

## 2017-05-07 NOTE — Assessment & Plan Note (Signed)
Stable, improved now mostly controlled and normal sleep on current meds - Remains on 1st shift Checked [place] CSRS for 2 years, appropriate with last fill Xanax 04/06/17, and last fill opiates from prior pain management Dr Regenia Skeeterogra 02/2017  Plan: 1. Encouraged that he is remaining off opioids now, successfully tapered off in August 2018, appropriate to continue BDZ long-term if controlling insomnia 2. Revieiwed and signed [facility] Controlled Contract for Xanax good for 1 year, reviewed prior UDS in system already 3. Refilled Xanax 1mg  at night QHS PRN #30 +2 refills for 3 month supply 4. Refilled Trazodone 100mg  nightly 90 day supply 5. Follow-up 3 months for med refills - can extend to q 6 mo in future if remains stable

## 2017-05-07 NOTE — Assessment & Plan Note (Signed)
Controlled HTN on higher dose Amlodipine - Home BP readings normal  No known complications - Prior history of hypotension and CKD due to multiple factors that have resolved   Plan:  1. Continue current BP regimen - increase Amlodipine from 5 to 10mg  daily as he is currently taking - new rx 10mg  tabs 2. Encourage improved lifestyle - low sodium diet, regular exercise 3. Continue monitor BP outside office, bring readings to next visit, if persistently >140/90 or new symptoms notify office sooner 4. Follow-up 3 months, labs within 1 month

## 2017-05-07 NOTE — Assessment & Plan Note (Signed)
Mostly controlled cholesterol on lifestyle Last lipid panel 05/2016  Plan: 1. Due repeat lipids - future order placed 1 month 2. Encourage improved lifestyle - low carb/cholesterol, reduce portion size, continue improving regular exercise 3. Follow-up

## 2017-05-07 NOTE — Assessment & Plan Note (Signed)
Stable chronic BPH with history of obstruction - On Flomax, failed doxazosin - Followed by [facility] Dr Sherryl BartersBudzyn, last seen 09/2016, had normal PVR  Plan: 1. Continue Flomax 0.4mg  daily 2. Follow-up with urology as planned

## 2017-05-07 NOTE — Assessment & Plan Note (Signed)
Prior Elevated A1c 5.6 to 5.7, no new dx PreDM at this time  Plan:  1. Not on any therapy currently  2. Encourage improved lifestyle - low carb, low sugar diet, reduce portion size, continue improving regular exercise 3. Follow-up A1c in 1 month if still >5.7 would consider new dx PreDM

## 2017-09-21 ENCOUNTER — Other Ambulatory Visit: Payer: Self-pay | Admitting: Family Medicine

## 2017-09-21 DIAGNOSIS — F5101 Primary insomnia: Secondary | ICD-10-CM

## 2017-10-20 ENCOUNTER — Encounter: Payer: Self-pay | Admitting: Urology

## 2017-10-20 ENCOUNTER — Ambulatory Visit: Payer: Managed Care, Other (non HMO)

## 2018-01-31 ENCOUNTER — Other Ambulatory Visit: Payer: Self-pay | Admitting: Family Medicine

## 2018-01-31 DIAGNOSIS — F5101 Primary insomnia: Secondary | ICD-10-CM

## 2018-03-08 ENCOUNTER — Encounter: Payer: Self-pay | Admitting: Family Medicine

## 2018-03-08 ENCOUNTER — Other Ambulatory Visit: Payer: Self-pay | Admitting: Family Medicine

## 2018-03-08 ENCOUNTER — Ambulatory Visit: Payer: Managed Care, Other (non HMO) | Admitting: Family Medicine

## 2018-03-08 VITALS — BP 124/94 | HR 80 | Temp 98.3°F | Resp 16 | Ht 69.0 in | Wt 179.0 lb

## 2018-03-08 DIAGNOSIS — R7309 Other abnormal glucose: Secondary | ICD-10-CM

## 2018-03-08 DIAGNOSIS — Z23 Encounter for immunization: Secondary | ICD-10-CM

## 2018-03-08 DIAGNOSIS — I10 Essential (primary) hypertension: Secondary | ICD-10-CM

## 2018-03-08 DIAGNOSIS — L249 Irritant contact dermatitis, unspecified cause: Secondary | ICD-10-CM | POA: Diagnosis not present

## 2018-03-08 DIAGNOSIS — G894 Chronic pain syndrome: Secondary | ICD-10-CM

## 2018-03-08 DIAGNOSIS — E559 Vitamin D deficiency, unspecified: Secondary | ICD-10-CM

## 2018-03-08 DIAGNOSIS — F419 Anxiety disorder, unspecified: Secondary | ICD-10-CM

## 2018-03-08 DIAGNOSIS — H9192 Unspecified hearing loss, left ear: Secondary | ICD-10-CM

## 2018-03-08 DIAGNOSIS — F32A Depression, unspecified: Secondary | ICD-10-CM

## 2018-03-08 DIAGNOSIS — Z0000 Encounter for general adult medical examination without abnormal findings: Secondary | ICD-10-CM

## 2018-03-08 DIAGNOSIS — F329 Major depressive disorder, single episode, unspecified: Secondary | ICD-10-CM

## 2018-03-08 DIAGNOSIS — E782 Mixed hyperlipidemia: Secondary | ICD-10-CM

## 2018-03-08 DIAGNOSIS — N182 Chronic kidney disease, stage 2 (mild): Secondary | ICD-10-CM

## 2018-03-08 DIAGNOSIS — F5101 Primary insomnia: Secondary | ICD-10-CM

## 2018-03-08 DIAGNOSIS — N400 Enlarged prostate without lower urinary tract symptoms: Secondary | ICD-10-CM

## 2018-03-08 DIAGNOSIS — E538 Deficiency of other specified B group vitamins: Secondary | ICD-10-CM

## 2018-03-08 MED ORDER — TRIAMCINOLONE ACETONIDE 0.5 % EX OINT: 1.0000 "application " | TOPICAL_OINTMENT | Freq: Two times a day (BID) | CUTANEOUS | 2 refills | Status: DC

## 2018-03-08 NOTE — Patient Instructions (Addendum)
Thank you for coming to the office today.  Referral to Audiology at this location - for hearing test  [facility] [address]  [place], [place] [address] Ph: [phone]  The rash looks most consistent with eczema, this can flare up and get worse due to a variety of factors (excessive dry skin from bathing/showering, soaps, cold weather / indoor heaters, outdoor exposures).  Use the topical steroid ointment twice a day for up to 1 week, maximum duration of use per one flare is 10 to 14 days, then STOP using it and allow skin to recover. Caution with over-use may cause lightening of the skin.   Do not use on face.  Tips to reduce Eczema Flares: For baths/showers, limit bathing to every other day if you can (max 1 x daily)  Use a gentle, unscented soap and lukewarm water (hot water is most irritating to skin) Never scrub skin with too much pressure, this causes more irritation. Pat skin dry, then leave it slightly damp. DO NOT scrub it dry. Apply steroid cream to skin and rub in all the way, wait 15 min, then apply a daily moisturizer (Vaseline, Eucerin, Aveeno). Continue daily moisturizer every day of the year (even after flare is resolved) - If you have eczema on hands or dry hands, recommend wearing any type of gloves overnight (cloth, fabric, or even nitrile/latex) to improve effect of topical moisturizer  If need dermatologist - contact office for referral  If need slightly stronger topical medicine or switch to cream, let me know.  If develops redness, honey colored crust oozing, drainage of pus, bleeding, or redness / swelling, pain, please return for re-evaluation, may have become infected after scratching.  DUE for FASTING BLOOD WORK (no food or drink after midnight before the lab appointment, only water or coffee without cream/sugar on the morning of)  3 months - [facility] - notify office when ready for [place] to release orders or pick up printed  orders  For Lab Results, once available within 2-3 days of blood draw, you can can log in to MyChart online to view your results and a brief explanation. Also, we can discuss results at next follow-up visit.   Please schedule a Follow-up Appointment to: Return in about 3 months (around 06/07/2018) for Annual Physical.  If you have any other questions or concerns, please feel free to call the office or send a message through MyChart. You may also schedule an earlier appointment if necessary.  Additionally, you may be receiving a survey about your experience at our office within a few days to 1 week by e-mail or mail. We value your feedback.  Saralyn Pilar, DO [facility], [place]

## 2018-03-08 NOTE — Progress Notes (Signed)
Subjective:    Patient ID: Brett Huber, male    DOB: 06-19-1966, 52 y.o.   MRN: [id number]  Brett Huber is a 52 y.o. male presenting on 03/08/2018 for Rash (both arms)   HPI   Contact Dermatitis, bilateral hands Reports new complaint, bilateral hand rash with dry itchy irritated skin on back of hands only, no where else on body. 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 He took time off for labor day recently and the rash healed. Now returned to work for past 3-4 days and symptoms have returned. - Tried OTC Hydrocortisone, Benadryl - Not seen Dermatology Admits some slight R hand swelling of skin superficial Denies any fever chills, spreading redness, warmth, drainage bleeding ulceration, numbness tingling  Left ear hearing, chronic Reports complication from prior acutely ill hospitalization back in 08/2016, he had acute hearing loss of L ear, and it gradually returned to about 50-60% over few months but never returned fully to normal. He is requesting Audiology evaluation. Now that he failed work hearing test.  Health Maintenance: Due for Flu Shot, will receive today    Depression screen [facility] 2/9 03/08/2018 05/06/2017 06/04/2016  Decreased Interest 0 0 0  Down, Depressed, Hopeless 0 0 0  PHQ - 2 Score 0 0 0  Altered sleeping 0 0 -  Tired, decreased energy 0 0 -  Change in appetite 0 0 -  Feeling bad or failure about yourself  0 0 -  Trouble concentrating 0 0 -  Moving slowly or fidgety/restless 0 0 -  Suicidal thoughts 0 0 -  PHQ-9 Score 0 0 -  Difficult doing work/chores Not difficult at all Not difficult at all -    Social History   Tobacco Use  . Smoking status: Never Smoker  . Smokeless tobacco: Current User    Types: Chew  Substance Use Topics  . Alcohol use: Yes    Comment: occ  . Drug use: No    Review of  Systems Per HPI unless specifically indicated above     Objective:    BP (!) 124/94   Pulse 80   Temp 98.3 F (36.8 C) (Oral)   Resp 16   Ht 5\' 9"  (1.753 m)   Wt 179 lb (81.2 kg)   BMI 26.43 kg/m   Wt Readings from Last 3 Encounters:  03/08/18 179 lb (81.2 kg)  05/06/17 193 lb (87.5 kg)  12/07/16 184 lb (83.5 kg)    Physical Exam  Constitutional: He is oriented to person, place, and time. He appears well-developed and well-nourished. No distress.  Well-appearing, comfortable, cooperative  HENT:  Head: Normocephalic and atraumatic.  Mouth/Throat: Oropharynx is clear and moist.  Bilateral TMs clear without erythema, effusion or bulging. Oropharynx clear without erythema, exudates, edema or asymmetry.   Eyes: Conjunctivae are normal. Right eye exhibits no discharge. Left eye exhibits no discharge.  Cardiovascular: Normal rate.  Pulmonary/Chest: Effort normal.  Neurological: He is alert and oriented to person, place, and time.  Skin: Skin is warm and dry. Rash (bilateral hands dorsal aspect with patchy dry scaley mildly erythematous skin, dermatitis, without ulceration) noted. He is not diaphoretic. No erythema.  Psychiatric: He has a normal mood and affect. His behavior is normal.  Well groomed, good eye contact, normal speech and thoughts  Nursing note and vitals reviewed.      Assessment &  Plan:   Problem List Items Addressed This Visit    Hearing loss of left ear Significant unresolved L ear hearing loss from acutely ill hospitalization, possibly ototoxic. Unremarkable exam today  Referral to  ENT - Audiology eval    Irritant hand dermatitis - Primary Clinically most consistent dx, no evidence of secondary infection. No other areas of body.  Reassurance 1. Trial rx higher potency topical steroid now with Triamcinolone 0.5% ointment, (body only) topical BID up to 1-2 weeks then stop for significant worsening flares only. 2. Start daily moisturizer BID 3.  Counseled on routine eczema prevention/skin care per AVS 4. Return criteria    Relevant Medications   triamcinolone ointment (KENALOG) 0.5 %    Other Visit Diagnoses    Needs flu shot       Relevant Orders   Flu Vaccine QUAD 36+ mos IM (Completed)        Meds ordered this encounter  Medications  . triamcinolone ointment (KENALOG) 0.5 %    Sig: Apply 1 application topically 2 (two) times daily. 1-2 weeks, for hands.    Dispense:  30 g    Refill:  2   Orders Placed This Encounter  Procedures  . Flu Vaccine QUAD 36+ mos IM  . Ambulatory referral to Audiology    Referral Priority:   Routine    Referral Type:   Audiology Exam    Referral Reason:   Specialty Services Required    Referred to Provider:   Loma Sousa, MD    Requested Specialty:   Audiology    Number of Visits Requested:   1    Follow up plan: Return in about 3 months (around 06/07/2018) for Annual Physical.  Future labs ordered for 05/2018  Saralyn Pilar, DO [facility] 03/08/2018, 4:33 PM

## 2018-03-16 ENCOUNTER — Telehealth: Payer: Self-pay | Admitting: Family Medicine

## 2018-03-16 NOTE — Telephone Encounter (Signed)
Robin, audiologist at [facility] said they have received 2 referral for pt and have tried to contact him but were unsuccessful so they have not scheduled appt.  Her call back number is [phone]

## 2018-03-17 NOTE — Telephone Encounter (Signed)
If you can reach patient to notify him and also perhaps mail him letter of this, that may work as well. 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  Brett [name] Armstrong Creasy, DO [facility] 03/17/2018, 10:48 AM

## 2018-06-07 ENCOUNTER — Other Ambulatory Visit: Payer: Self-pay | Admitting: Family Medicine

## 2018-06-07 DIAGNOSIS — F5101 Primary insomnia: Secondary | ICD-10-CM

## 2018-06-07 MED ORDER — ALPRAZOLAM 1 MG PO TABS: ORAL_TABLET | ORAL | 2 refills | Status: DC

## 2018-06-29 ENCOUNTER — Other Ambulatory Visit: Payer: Self-pay | Admitting: Family Medicine

## 2018-06-29 DIAGNOSIS — I10 Essential (primary) hypertension: Secondary | ICD-10-CM

## 2018-07-05 ENCOUNTER — Other Ambulatory Visit: Payer: Self-pay | Admitting: Family Medicine

## 2018-07-05 DIAGNOSIS — F5101 Primary insomnia: Secondary | ICD-10-CM

## 2018-09-29 IMAGING — MR MR HEAD W/O CM
10 series · 45 of 48 positions shown · non-contrast
Comparison: Head CT from yesterday

CLINICAL DATA: Increasing confusion.  Vomiting.

EXAM:
MRI HEAD WITHOUT CONTRAST
TECHNIQUE: Multiplanar, multiecho pulse sequences of the brain and surrounding
structures were obtained without intravenous contrast.

[Series 2: T1 · sagittal · 5.0mm · 0.45mm/px · 5 of 27 slices shown]
[im 1/27]
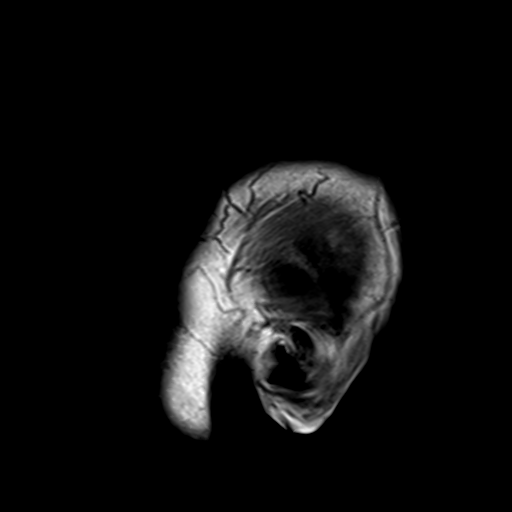
[im 7/27]
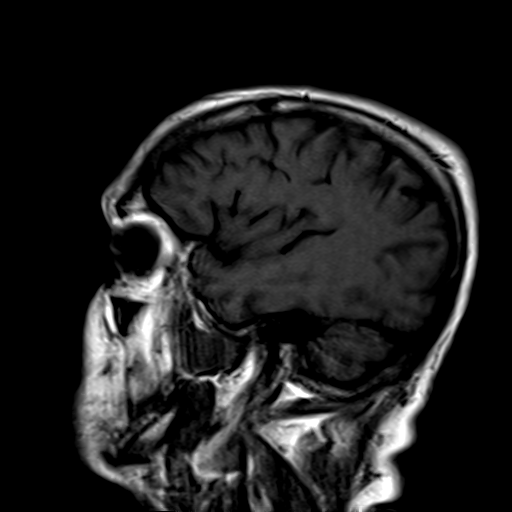
[im 14/27]
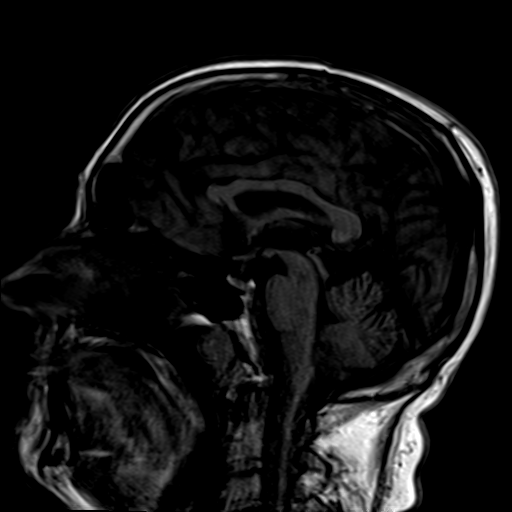
[im 20/27]
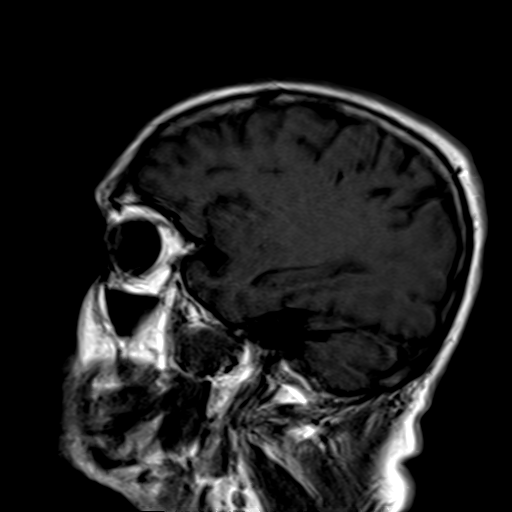
[im 27/27]
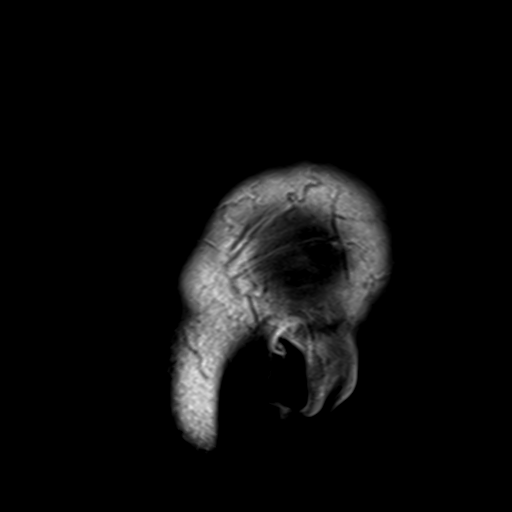

[Series 4: DWI · axial · 3.0mm · 1.80mm/px · z∈[-51,+102]mm · 8 of 51 slices shown (1 of 2)]
[im 1/51]
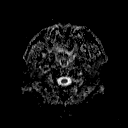
[im 8/51]
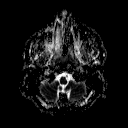
[im 15/51]
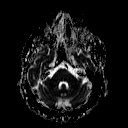
[im 22/51]
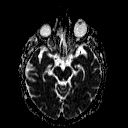
[im 29/51]
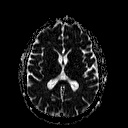
[im 36/51]
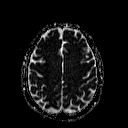
[im 43/51]
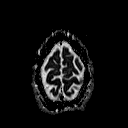
[im 51/51]
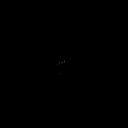

[Series 6: DWI · coronal · 3.0mm · 1.80mm/px · 6 of 45 slices shown (2 of 2)]
[im 1/45]
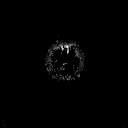
[im 9/45]
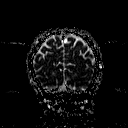
[im 18/45]
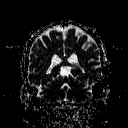
[im 27/45]
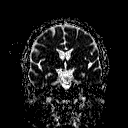
[im 36/45]
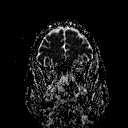
[im 45/45]
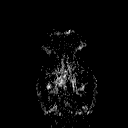

[Series 7: T2 · axial · 5.0mm · 0.60mm/px · z∈[-52,+98]mm · 3 of 25 slices shown (1 of 3)]
[im 1/25]
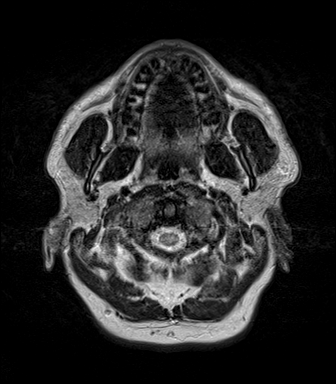
[im 13/25]
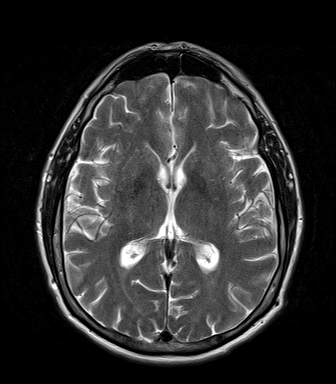
[im 25/25]
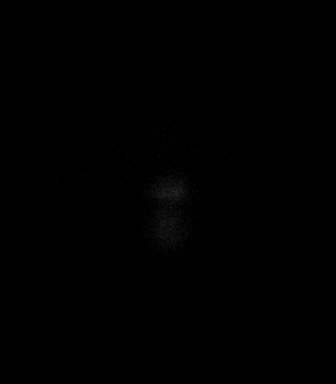

[Series 8: FLAIR · axial · 5.0mm · 0.45mm/px · z∈[-52,+98]mm · 3 of 25 slices shown]
[im 1/25]
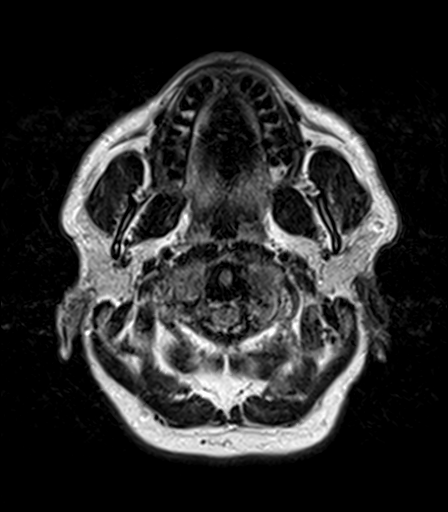
[im 13/25]
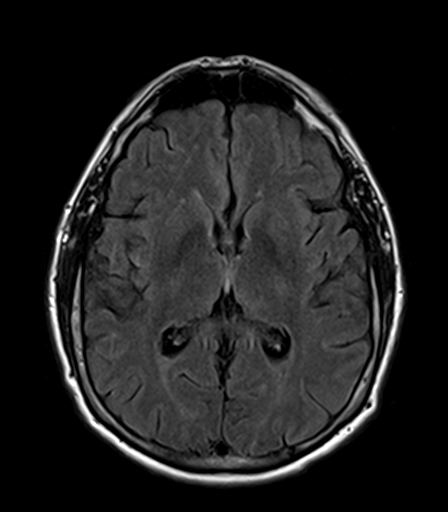
[im 25/25]
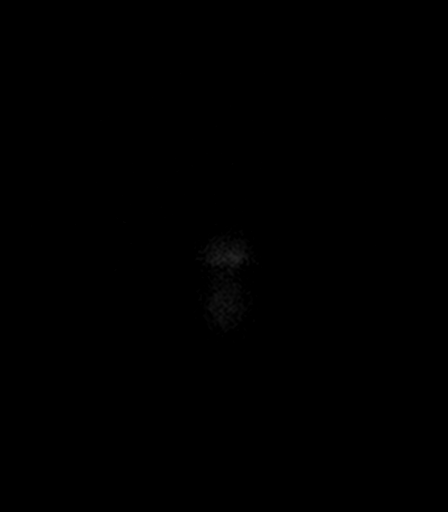

[Series 9: T2 · axial · 5.0mm · 0.45mm/px · z∈[-52,+98]mm · 3 of 25 slices shown (2 of 3)]
[im 1/25]
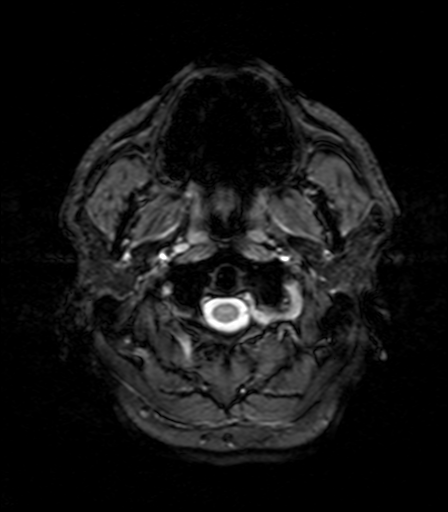
[im 13/25]
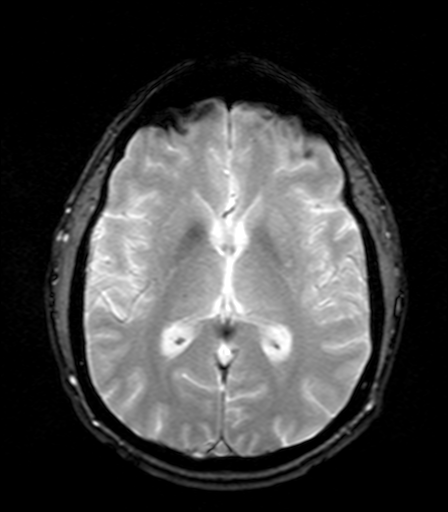
[im 25/25]
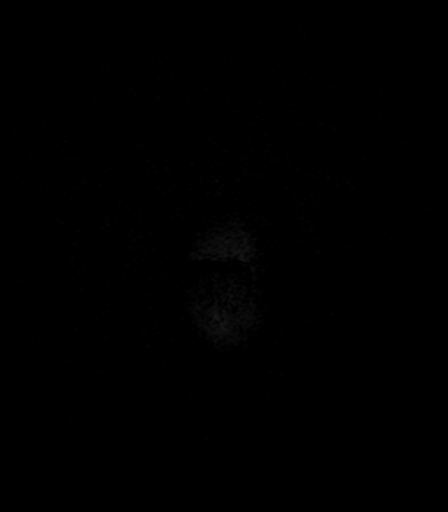

[Series 12: GRE · axial · 5.0mm · 0.45mm/px · z∈[-72,+80]mm · 3 of 25 slices shown]
[im 1/25]
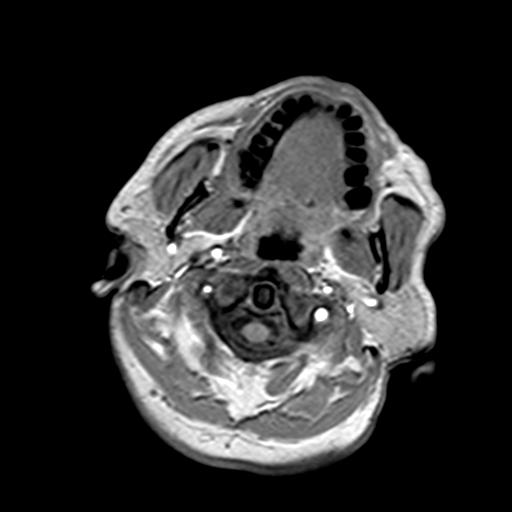
[im 13/25]
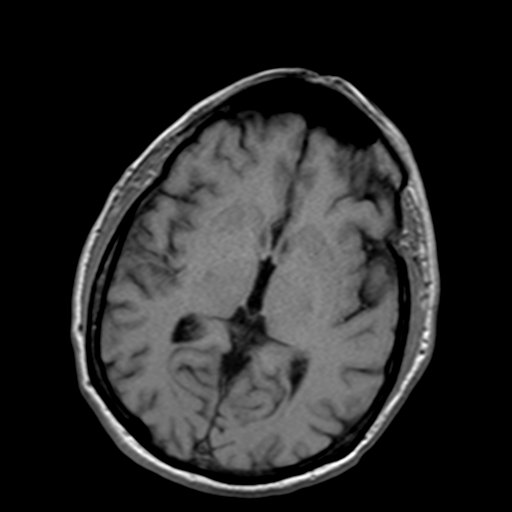
[im 25/25]
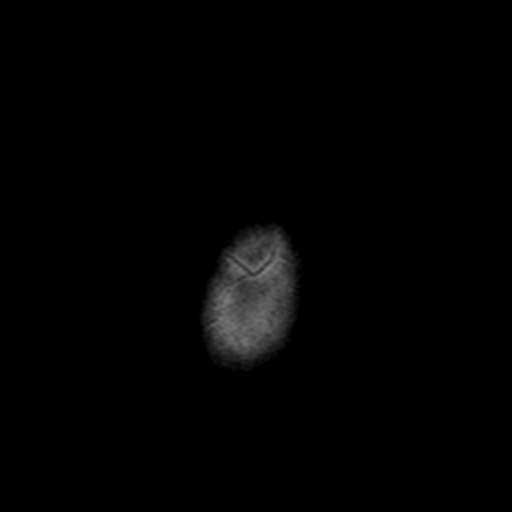

[Series 13: T2 · coronal · 5.0mm · 0.45mm/px · 3 of 25 slices shown (3 of 3)]
[im 1/25]
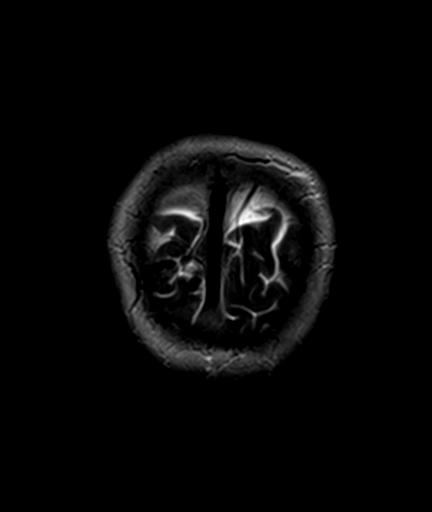
[im 13/25]
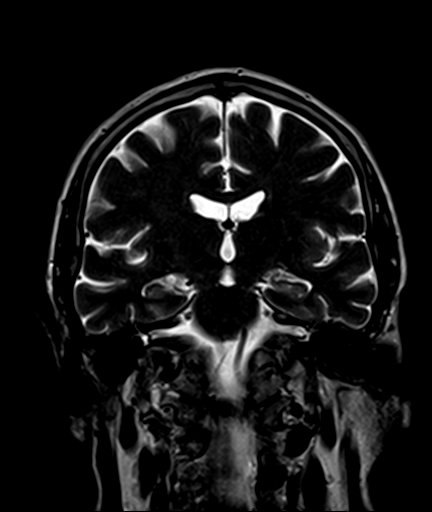
[im 25/25]
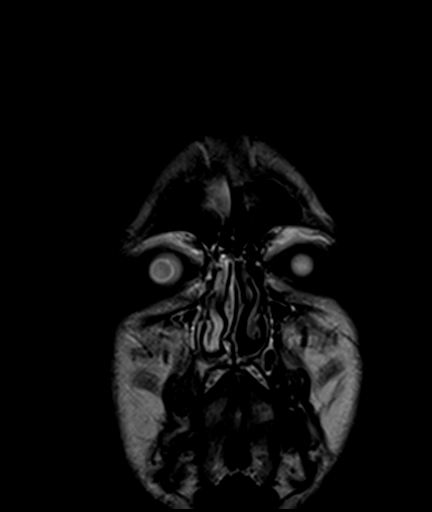

[Series 100: (id) · axial · 3.0mm · 1.80mm/px · z∈[-51,+105]mm · 8 of 55 slices shown]
[im 1/55]
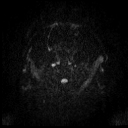
[im 8/55]
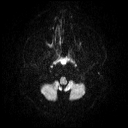
[im 16/55]
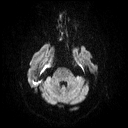
[im 24/55]
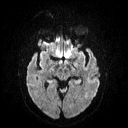
[im 31/55]
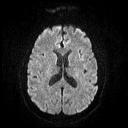
[im 39/55]
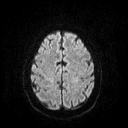
[im 47/55]
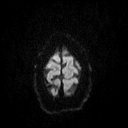
[im 55/55]
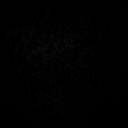

[Series 101: (id) cor · coronal · 3.0mm · 1.80mm/px · 3 of 45 slices shown]
[im 1/45]
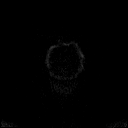
[im 9/45]
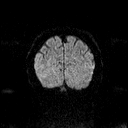
[im 18/45]
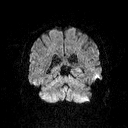

[45 of 48 positions shown; findings below may reference images not displayed]

FINDINGS: Brain: No acute infarction, hemorrhage, hydrocephalus, extra-axial
collection or mass lesion. Few FLAIR hyperintensities in the
cerebral white matter attributed to chronic microvascular insults.
These are commonly encounter by the patient's age. Normal brain
volume.

Vascular: Normal flow voids

Skull and upper cervical spine: Negative

Sinuses/Orbits: Adenoid thickening symmetrically. Sinusitis, most
notably mucosal thickening with fluid retained in the right
maxillary antrum.
IMPRESSION: 1. Negative intracranial imaging.  No explanation for symptoms.
2. Right maxillary sinusitis.

## 2018-10-02 ENCOUNTER — Other Ambulatory Visit: Payer: Self-pay | Admitting: Family Medicine

## 2018-10-02 DIAGNOSIS — F5101 Primary insomnia: Secondary | ICD-10-CM

## 2018-10-02 NOTE — Telephone Encounter (Signed)
* * * * * * * * * * * * * * * *   Please see the following message:  Patient is due for a follow-up appointment - in order to get his medications renewed.  I have declined his rx Trazodone and Alprazolam.  Please contact him to schedule a Virtual Telephone visit this week for medication refills. He may schedule Tuesday 4/7 if he prefers.  Saralyn Pilar, DO [facility] 10/02/2018, 4:53 PM

## 2018-10-06 ENCOUNTER — Other Ambulatory Visit: Payer: Self-pay | Admitting: Family Medicine

## 2018-10-06 ENCOUNTER — Ambulatory Visit (INDEPENDENT_AMBULATORY_CARE_PROVIDER_SITE_OTHER): Payer: No Typology Code available for payment source | Admitting: Family Medicine

## 2018-10-06 ENCOUNTER — Other Ambulatory Visit: Payer: Self-pay

## 2018-10-06 ENCOUNTER — Encounter: Payer: Self-pay | Admitting: Family Medicine

## 2018-10-06 DIAGNOSIS — N400 Enlarged prostate without lower urinary tract symptoms: Secondary | ICD-10-CM

## 2018-10-06 DIAGNOSIS — Z0000 Encounter for general adult medical examination without abnormal findings: Secondary | ICD-10-CM

## 2018-10-06 DIAGNOSIS — I10 Essential (primary) hypertension: Secondary | ICD-10-CM

## 2018-10-06 DIAGNOSIS — F5101 Primary insomnia: Secondary | ICD-10-CM | POA: Diagnosis not present

## 2018-10-06 DIAGNOSIS — R7309 Other abnormal glucose: Secondary | ICD-10-CM

## 2018-10-06 DIAGNOSIS — E559 Vitamin D deficiency, unspecified: Secondary | ICD-10-CM

## 2018-10-06 DIAGNOSIS — L249 Irritant contact dermatitis, unspecified cause: Secondary | ICD-10-CM | POA: Diagnosis not present

## 2018-10-06 DIAGNOSIS — E782 Mixed hyperlipidemia: Secondary | ICD-10-CM

## 2018-10-06 DIAGNOSIS — N182 Chronic kidney disease, stage 2 (mild): Secondary | ICD-10-CM

## 2018-10-06 MED ORDER — TRIAMCINOLONE ACETONIDE 0.5 % EX OINT: 1.0000 "application " | TOPICAL_OINTMENT | Freq: Two times a day (BID) | CUTANEOUS | 2 refills | Status: AC

## 2018-10-06 MED ORDER — ALPRAZOLAM 1 MG PO TABS: ORAL_TABLET | ORAL | 2 refills | Status: DC

## 2018-10-06 MED ORDER — TRAZODONE HCL 100 MG PO TABS: 100.0000 mg | ORAL_TABLET | Freq: Every day | ORAL | 1 refills | Status: DC

## 2018-10-06 MED ORDER — AMLODIPINE BESYLATE 10 MG PO TABS: 10.0000 mg | ORAL_TABLET | Freq: Every day | ORAL | 1 refills | Status: DC

## 2018-10-06 NOTE — Patient Instructions (Addendum)
Refilled medications  Same instructions for eczema dermatitis skin care as last time. Refilled triamcinolone cream, use at night with gloves, keep using daily moisturizer as well.  Contact us if interested in 2nd opinion dermatology   DUE for FASTING BLOOD WORK (no food or drink after midnight before the lab appointment, only water or coffee without cream/sugar on the morning of)  SCHEDULE "Lab Only" visit in the morning at the clinic for lab draw in 6 MONTHS   - Make sure Lab Only appointment is at about 1 week before your next appointment, so that results will be available  For Lab Results, once available within 2-3 days of blood draw, you can can log in to MyChart online to view your results and a brief explanation. Also, we can discuss results at next follow-up visit.    Please schedule a Follow-up Appointment to: Return in about 6 months (around 04/07/2019) for Annual Physical.  If you have any other questions or concerns, please feel free to call the office or send a message through MyChart. You may also schedule an earlier appointment if necessary.  Additionally, you may be receiving a survey about your experience at our office within a few days to 1 week by e-mail or mail. We value your feedback.  Saralyn Pilar, DO [facility], [place]

## 2018-10-06 NOTE — Assessment & Plan Note (Signed)
Intermittent worsening contact irritant dermatitis hands, now with frequent glove wearing and some moisture at work Clinically consistent based on history from last evaluation in 2019 No other areas on body or sign of secondary complication  Reassurance 1. Refill topical steroid now with Triamcinolone 0.5% ointment, (body only) topical BID up to 1-2 weeks then stop for significant worsening flares only. 2. Keep daily moisturizer BID 3. Counseled on routine eczema prevention/skin care as per last discussion 4. Return criteria - future reconsider Dermatology if unresolved

## 2018-10-06 NOTE — Assessment & Plan Note (Signed)
Improved and now well controlled on current regimen and new work schedule Checked [place] CSRS for 2 years, appropriate Prior Non opioid controlled contract signed - unable to renew if not in office today  Plan: 1. Refilled Xanax 1mg  at night QHS PRN #30 +2 refills for 3 month supply - refill when request 3 months 2. Refilled Trazodone 100mg  nightly 90 day supply - no change, current dose working well 3. Follow-up 6 months annual

## 2018-10-06 NOTE — Assessment & Plan Note (Addendum)
Controlled HTN currently - Home BP readings normal  Prior mild CKD-II, due for re-check    Plan:  1. Continue current BP regimen - Amlodipine 10mg  daily - refill 2. Encourage improved lifestyle - low sodium diet, regular exercise 3. Continue monitor BP outside office, bring readings to next visit, if persistently >140/90 or new symptoms notify office sooner 4. Follow-up 6 months

## 2018-10-06 NOTE — Progress Notes (Signed)
Virtual Visit via Telephone The purpose of this virtual visit is to provide medical care while limiting exposure to the novel coronavirus (COVID19) for both patient and office staff.  Consent was obtained for phone visit:  Yes.   Answered questions that patient had about telehealth interaction:  Yes.   I discussed the limitations, risks, security and privacy concerns of performing an evaluation and management service by telephone. I also discussed with the patient that there may be a patient responsible charge related to this service. The patient expressed understanding and agreed to proceed.  Patient Location: Home Provider Location: [facility])  ---------------------------------------------------------------------- Chief Complaint  Patient presents with  . Insomnia  . Hypertension  . Dermatitis, skin hands    S: Reviewed CMA telephone note below. I have called patient and gathered additional HPI as follows:  CHRONIC HTN: Reports home BP readings remain improved and controlled on current med. Current Meds - Amlodipine  daily Reports good compliance, took meds today. Tolerating well, w/o complaints. Denies CP, dyspnea, HA, edema, dizziness / lightheadedness  Follow-up Contact Dermatitis, bilateral hands - Last visit with me for initial visit for same problem 03/08/18, treated with rx triamcinolone 0.5% BID PRN and skin care instructions, see prior notes for background information. - Interval update with improvement on rx steroid ointment, had improved, but now worsening due to regulated glove wearing restrictions - Today patient reports recent worsening skin dermatitis irritation on hands, now he has to wear gloves entire 11 hour shift at work - he still has some remaining ointment triamcinolone uses PRN nightly wears gloves at night to help absorb, use for few days then resolve usually, also using aquaphor but less effective now, needs new rx of  triamcinolone  FOLLOW-UP INSOMNIA, Chronic: Last visit for routine follow-up 04/2017 for this topic, he has been well managed and controlled on current med regimen, see prior note for background - Now job hours changed - now working 4 day week, instead of 6 days a week. Start 430am to 330pm. Now going to bed around 8pm prior to work. - Feeling really well - taking Trazodone  nightly - Taking Xanax  = one whole  pill 4-5 days a week before bed for insomnia on work days, and occasionally takes a half pill for a few nights before bed if weekend Denies any mania symptoms, pain keeping him awake, tremors, nightmares, restless leg syndrome  Denies any high risk travel to areas of current concern for COVID19. Denies any known or suspected exposure to person with or possibly with COVID19.  Denies any fevers, chills, sweats, body ache, cough, shortness of breath, sinus pain or pressure, headache, abdominal pain, diarrhea  Past Medical History:  Diagnosis Date  . Anxiety   . Chronic pain   . DDD (degenerative disc disease)   . Hyperlipidemia   . Hypertension   . Insomnia   . Sinus congestion    Social History   Tobacco Use  . Smoking status: Never Smoker  . Smokeless tobacco: Current User    Types: Chew  Substance Use Topics  . Alcohol use: Yes    Comment: occ  . Drug use: No    Current Outpatient Medications:  .  ALPRAZolam (XANAX) 1 MG tablet, TAKE 1 TABLET BY MOUTH AT BEDTIME AS NEEDED FOR  INSOMNIA, Disp: 30 tablet, Rfl: 2 .  amLODipine (NORVASC) 10 MG tablet, Take 1 tablet (10 mg total) by mouth daily., Disp: 90 tablet, Rfl: 1 .  Cholecalciferol (VITAMIN  D3) 5000 units CAPS, Take 1 capsule (5,000 Units total) by mouth daily. For 12 weeks, then start Vitamin D3 2,000 units daily (OTC), Disp: 90 capsule, Rfl: 0 .  traZODone (DESYREL) 100 MG tablet, Take 1 tablet (100 mg total) by mouth at bedtime., Disp: 90 tablet, Rfl: 1 .  triamcinolone ointment (KENALOG) 0.5 %, Apply  1 application topically 2 (two) times daily. 1-2 weeks, for hands., Disp: 30 g, Rfl: 2  [facility]/06/2018 03/08/2018 05/06/2017  Decreased Interest 0 0 0  Down, Depressed, Hopeless 0 0 0  PHQ - 2 Score 0 0 0  Altered sleeping 0 0 0  Tired, decreased energy 0 0 0  Change in appetite 0 0 0  Feeling bad or failure about yourself  0 0 0  Trouble concentrating 0 0 0  Moving slowly or fidgety/restless 0 0 0  Suicidal thoughts 0 0 0  PHQ-9 Score 0 0 0  Difficult doing work/chores Not difficult at all Not difficult at all Not difficult at all    GAD 7 : Generalized Anxiety Score 10/06/2018  Nervous, Anxious, on Edge 1  Control/stop worrying 0  Worry too much - different things 0  Trouble relaxing 0  Restless 0  Easily annoyed or irritable 0  Afraid - awful might happen 1  Total GAD 7 Score 2  Anxiety Difficulty Not difficult at all    -------------------------------------------------------------------------- O: No physical exam performed due to remote telephone encounter.   No results found for this or any previous visit (from the past 2160 hour(s)).  -------------------------------------------------------------------------- A&P:  Problem List Items Addressed This Visit    Hypertension    Controlled HTN currently - Home BP readings normal  Prior mild CKD-II, due for re-check    Plan:  1. Continue current BP regimen - Amlodipine 10mg  daily - refill 2. Encourage improved lifestyle - low sodium diet, regular exercise 3. Continue monitor BP outside office, bring readings to next visit, if persistently >140/90 or new symptoms notify office sooner 4. Follow-up 6 months      Relevant Medications   amLODipine (NORVASC) 10 MG tablet   Insomnia - Primary    Improved and now well controlled on current regimen and new work schedule Checked [place] CSRS for 2 years, appropriate Prior Non opioid controlled contract signed - unable to renew if not in office  today  Plan: 1. Refilled Xanax 1mg  at night QHS PRN #30 +2 refills for 3 month supply - refill when request 3 months 2. Refilled Trazodone 100mg  nightly 90 day supply - no change, current dose working well 3. Follow-up 6 months annual      Relevant Medications   traZODone (DESYREL) 100 MG tablet   ALPRAZolam (XANAX) 1 MG tablet   Irritant hand dermatitis    Intermittent worsening contact irritant dermatitis hands, now with frequent glove wearing and some moisture at work Clinically consistent based on history from last evaluation in 2019 No other areas on body or sign of secondary complication  Reassurance 1. Refill topical steroid now with Triamcinolone 0.5% ointment, (body only) topical BID up to 1-2 weeks then stop for significant worsening flares only. 2. Keep daily moisturizer BID 3. Counseled on routine eczema prevention/skin care as per last discussion 4. Return criteria - future reconsider Dermatology if unresolved      Relevant Medications   triamcinolone ointment (KENALOG) 0.5 %      Meds ordered this encounter  Medications  . traZODone (DESYREL) 100 MG tablet  Sig: Take 1 tablet (100 mg total) by mouth at bedtime.    Dispense:  90 tablet    Refill:  1  . amLODipine (NORVASC) 10 MG tablet    Sig: Take 1 tablet (10 mg total) by mouth daily.    Dispense:  90 tablet    Refill:  1  . ALPRAZolam (XANAX) 1 MG tablet    Sig: TAKE 1 TABLET BY MOUTH AT BEDTIME AS NEEDED FOR  INSOMNIA    Dispense:  30 tablet    Refill:  2  . triamcinolone ointment (KENALOG) 0.5 %    Sig: Apply 1 application topically 2 (two) times daily. 1-2 weeks, for hands.    Dispense:  30 g    Refill:  2    Follow-up: - Return in 6 months for Annual Physical - Future labs ordered for 04/02/19  Patient verbalizes understanding with the above medical recommendations including the limitation of remote medical advice.  Specific follow-up and call-back criteria were given for patient to follow-up  or seek medical care more urgently if needed.   - Time spent in direct consultation with patient on phone: 11 minutes  Saralyn Pilar, DO [facility] 10/06/2018, 1:37 PM

## 2019-01-24 ENCOUNTER — Other Ambulatory Visit: Payer: Self-pay | Admitting: Family Medicine

## 2019-01-24 DIAGNOSIS — F5101 Primary insomnia: Secondary | ICD-10-CM

## 2019-02-24 IMAGING — CR DG CHEST 2V
2 series · 2 of 2 positions shown · non-contrast
Comparison: 08/28/2016

CLINICAL DATA: Altered mental status with fever

EXAM:
CHEST  2 VIEW

[chest pa]
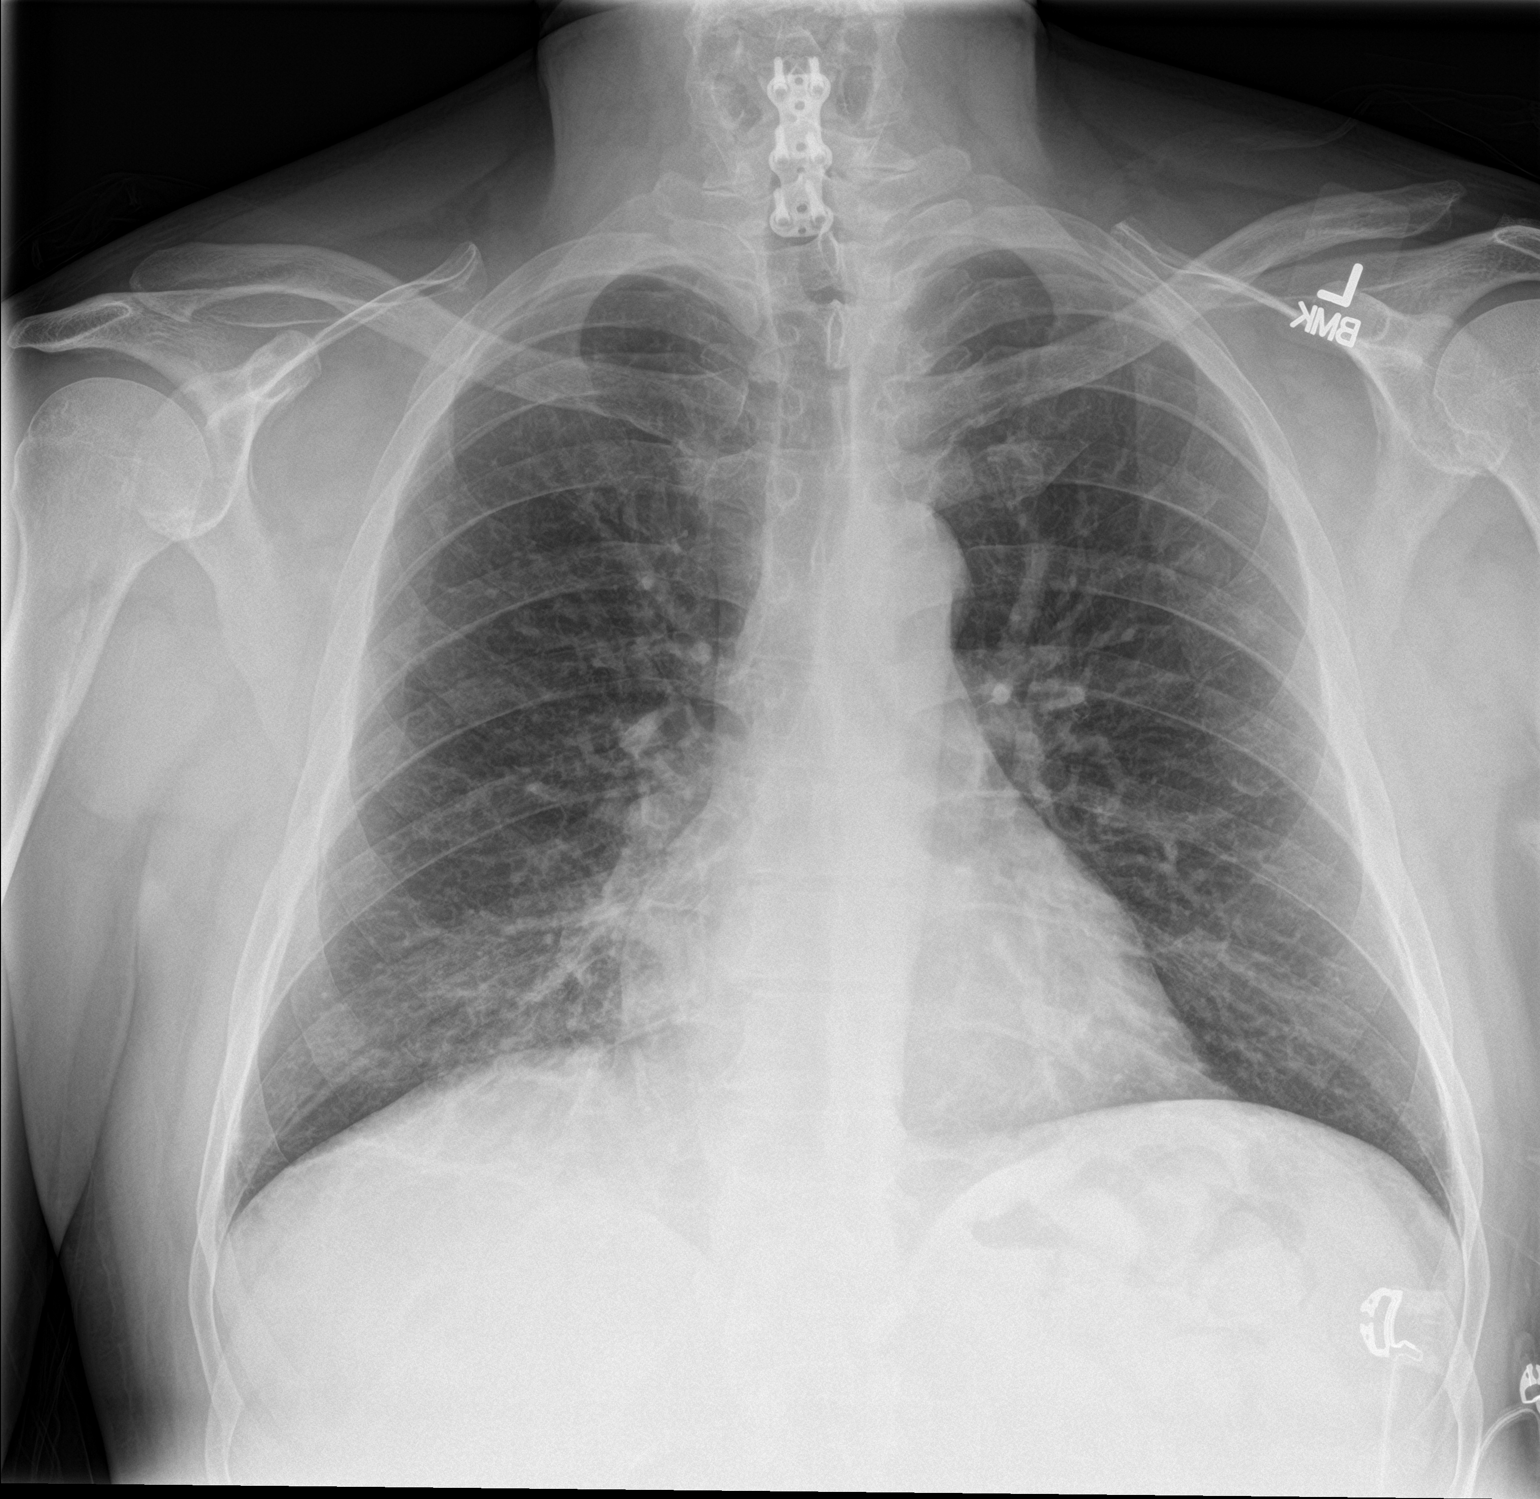

[chest lat]
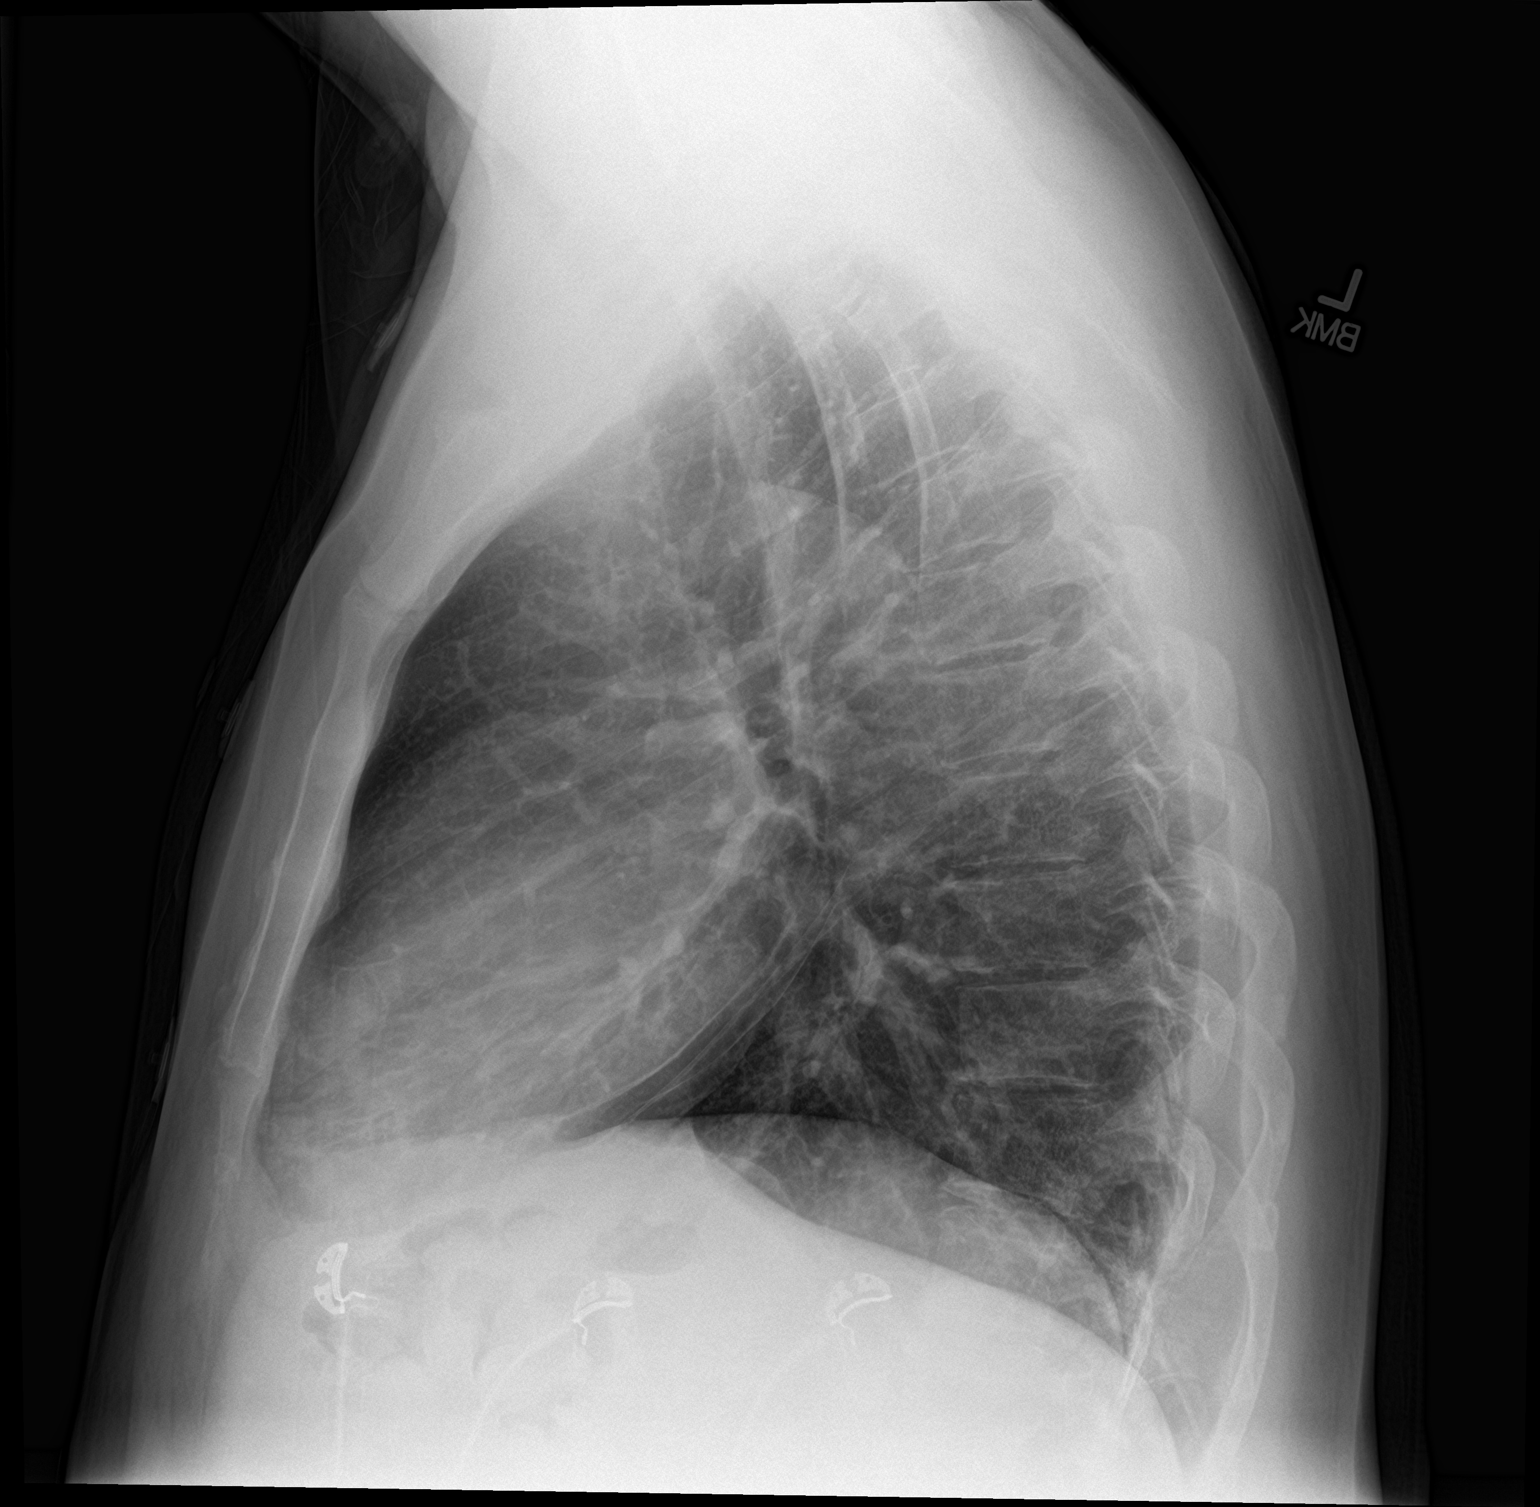

[2 of 2 positions shown; findings below may reference images not displayed]

FINDINGS: Cervical spine hardware. Streaky atelectasis at the left base.
Patchy right middle lobe infiltrate. Normal heart size. No
pneumothorax.
IMPRESSION: Patchy right middle lobe infiltrate.

## 2019-03-30 ENCOUNTER — Other Ambulatory Visit: Payer: No Typology Code available for payment source

## 2019-03-30 ENCOUNTER — Other Ambulatory Visit: Payer: Self-pay

## 2019-03-30 DIAGNOSIS — E559 Vitamin D deficiency, unspecified: Secondary | ICD-10-CM

## 2019-03-30 DIAGNOSIS — R7309 Other abnormal glucose: Secondary | ICD-10-CM

## 2019-03-30 DIAGNOSIS — N182 Chronic kidney disease, stage 2 (mild): Secondary | ICD-10-CM

## 2019-03-30 DIAGNOSIS — I10 Essential (primary) hypertension: Secondary | ICD-10-CM

## 2019-03-30 DIAGNOSIS — Z0000 Encounter for general adult medical examination without abnormal findings: Secondary | ICD-10-CM

## 2019-03-30 DIAGNOSIS — N400 Enlarged prostate without lower urinary tract symptoms: Secondary | ICD-10-CM

## 2019-03-30 DIAGNOSIS — E782 Mixed hyperlipidemia: Secondary | ICD-10-CM

## 2019-03-31 LAB — CBC WITH DIFFERENTIAL/PLATELET
Absolute Monocytes: 852 cells/uL (ref 200–950)
Basophils Absolute: 42 cells/uL (ref 0–200)
Basophils Relative: 0.7 %
Eosinophils Absolute: 360 cells/uL (ref 15–500)
Eosinophils Relative: 6 %
HCT: 45.2 % (ref 38.5–50.0)
Hemoglobin: 15.7 g/dL (ref 13.2–17.1)
Lymphs Abs: 894 cells/uL (ref 850–3900)
MCH: 30.3 pg (ref 27.0–33.0)
MCHC: 34.7 g/dL (ref 32.0–36.0)
MCV: 87.1 fL (ref 80.0–100.0)
MPV: 10.3 fL (ref 7.5–12.5)
Monocytes Relative: 14.2 %
Neutro Abs: 3852 cells/uL (ref 1500–7800)
Neutrophils Relative %: 64.2 %
Platelets: 263 10*3/uL (ref 140–400)
RBC: 5.19 10*6/uL (ref 4.20–5.80)
RDW: 12.9 % (ref 11.0–15.0)
Total Lymphocyte: 14.9 %
WBC: 6 10*3/uL (ref 3.8–10.8)

## 2019-03-31 LAB — LIPID PANEL
Cholesterol: 216 mg/dL — ABNORMAL HIGH (ref <200)
HDL: 69 mg/dL (ref >=40)
LDL Cholesterol (Calc): 129 mg/dL (calc) — ABNORMAL HIGH
Non-HDL Cholesterol (Calc): 147 mg/dL (calc) — ABNORMAL HIGH (ref <130)
Total CHOL/HDL Ratio: 3.1 (calc) (ref <5.0)
Triglycerides: 81 mg/dL (ref <150)

## 2019-03-31 LAB — COMPLETE METABOLIC PANEL WITH GFR
AG Ratio: 2 (calc) (ref 1.0–2.5)
ALT: 15 U/L (ref 9–46)
AST: 15 U/L (ref 10–35)
Albumin: 4.3 g/dL (ref 3.6–5.1)
Alkaline phosphatase (APISO): 52 U/L (ref 35–144)
BUN: 15 mg/dL (ref 7–25)
CO2: 29 mmol/L (ref 20–32)
Calcium: 9.6 mg/dL (ref 8.6–10.3)
Chloride: 102 mmol/L (ref 98–110)
Creat: 1.24 mg/dL (ref 0.70–1.33)
GFR, Est African American: 76 mL/min/{1.73_m2} (ref >=60)
GFR, Est Non African American: 65 mL/min/{1.73_m2} (ref >=60)
Globulin: 2.2 g/dL (calc) (ref 1.9–3.7)
Glucose, Bld: 120 mg/dL — ABNORMAL HIGH (ref 65–99)
Potassium: 3.9 mmol/L (ref 3.5–5.3)
Sodium: 139 mmol/L (ref 135–146)
Total Bilirubin: 0.8 mg/dL (ref 0.2–1.2)
Total Protein: 6.5 g/dL (ref 6.1–8.1)

## 2019-03-31 LAB — HEMOGLOBIN A1C
Hgb A1c MFr Bld: 5.2 % of total Hgb (ref <5.7)
Mean Plasma Glucose: 103 (calc)
eAG (mmol/L): 5.7 (calc)

## 2019-03-31 LAB — VITAMIN D 25 HYDROXY (VIT D DEFICIENCY, FRACTURES): Vit D, 25-Hydroxy: 26 ng/mL — ABNORMAL LOW (ref 30–100)

## 2019-03-31 LAB — PSA: PSA: 0.4 ng/mL (ref <=4.0)

## 2019-04-02 ENCOUNTER — Other Ambulatory Visit: Payer: Managed Care, Other (non HMO)

## 2019-04-05 ENCOUNTER — Other Ambulatory Visit: Payer: Self-pay

## 2019-04-05 ENCOUNTER — Encounter: Payer: Self-pay | Admitting: Family Medicine

## 2019-04-05 ENCOUNTER — Ambulatory Visit (INDEPENDENT_AMBULATORY_CARE_PROVIDER_SITE_OTHER): Payer: No Typology Code available for payment source | Admitting: Family Medicine

## 2019-04-05 DIAGNOSIS — M25511 Pain in right shoulder: Secondary | ICD-10-CM

## 2019-04-05 DIAGNOSIS — M7541 Impingement syndrome of right shoulder: Secondary | ICD-10-CM

## 2019-04-05 DIAGNOSIS — G8929 Other chronic pain: Secondary | ICD-10-CM

## 2019-04-05 DIAGNOSIS — S46911A Strain of unspecified muscle, fascia and tendon at shoulder and upper arm level, right arm, initial encounter: Secondary | ICD-10-CM

## 2019-04-05 MED ORDER — CYCLOBENZAPRINE HCL 10 MG PO TABS: 10.0000 mg | ORAL_TABLET | Freq: Every evening | ORAL | 0 refills | Status: DC | PRN

## 2019-04-05 MED ORDER — NAPROXEN 500 MG PO TABS: 500.0000 mg | ORAL_TABLET | Freq: Two times a day (BID) | ORAL | 0 refills | Status: DC

## 2019-04-05 NOTE — Progress Notes (Signed)
Virtual Visit via Telephone The purpose of this virtual visit is to provide medical care while limiting exposure to the novel coronavirus (COVID19) for both patient and office staff.  Consent was obtained for phone visit:  Yes.   Answered questions that patient had about telehealth interaction:  Yes.   I discussed the limitations, risks, security and privacy concerns of performing an evaluation and management service by telephone. I also discussed with the patient that there may be a patient responsible charge related to this service. The patient expressed understanding and agreed to proceed.  Patient Location: Home Provider Location: [facility])  ---------------------------------------------------------------------- Chief Complaint  Patient presents with  . Shoulder Pain    onset week     S: Reviewed CMA documentation. 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 He says if he keeps his arm next to his body then he is okay without pain but if he reaches out or up will have more moderate to severe sharp pain and includes a radiating pain up to neck. - History of Rotator cuff surgery on Left arm 6-7 years ago. 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 Denies any known or suspected exposure to person with or possibly with COVID19.  Denies any  fevers, chills, sweats, body ache, cough, shortness of breath, sinus pain or pressure, headache, abdominal pain, diarrhea  Past Medical History:  Diagnosis Date  . Anxiety   . Chronic pain   . DDD (degenerative disc disease)   . Hyperlipidemia   . Hypertension   . Insomnia   . Sinus congestion    Social History   Tobacco Use  . Smoking status: Never Smoker  . Smokeless tobacco: Current User    Types: Chew  Substance Use Topics  . Alcohol use: Yes    Comment: occ  . Drug use: No    Current Outpatient Medications:  .  ALPRAZolam (XANAX) 1 MG tablet, TAKE 1 TABLET BY MOUTH AT BEDTIME AS NEEDED FOR INSOMNIA, Disp: 30 tablet, Rfl: 2 .  amLODipine (NORVASC) 10 MG tablet, Take 1 tablet (10 mg total) by mouth daily., Disp: 90 tablet, Rfl: 1 .  Cholecalciferol (VITAMIN D3) 5000 units CAPS, Take 1 capsule (5,000 Units total) by mouth daily. For 12 weeks, then start Vitamin D3 2,000 units daily (OTC), Disp: 90 capsule, Rfl: 0 .  traZODone (DESYREL) 100 MG tablet, Take 1 tablet (100 mg total) by mouth at bedtime., Disp: 90 tablet, Rfl: 1 .  triamcinolone ointment (KENALOG) 0.5 %, Apply 1 application topically 2 (two) times daily. 1-2 weeks, for hands., Disp: 30 g, Rfl: 2 .  cyclobenzaprine (FLEXERIL) 10 MG tablet, Take 1 tablet (10 mg total) by mouth at bedtime as needed for muscle spasms., Disp: 30 tablet, Rfl: 0 .  naproxen (NAPROSYN) 500 MG tablet, Take 1 tablet (500 mg total) by mouth 2 (  two) times daily with a meal. For 1-2 weeks then as needed, Disp: 60 tablet, Rfl: 0  Depression screen [facility] 2/9 04/05/2019 10/06/2018 03/08/2018  Decreased Interest 1 0 0  Down, Depressed, Hopeless 1 0 0  PHQ - 2 Score 2 0 0  Altered sleeping 0 0 0  Tired, decreased energy 0 0 0  Change in appetite 0 0 0  Feeling bad or failure about yourself  0 0 0  Trouble concentrating 0 0 0  Moving slowly or fidgety/restless 0 0 0  Suicidal thoughts 0 0 0  PHQ-9 Score 2 0 0  Difficult doing work/chores Not  difficult at all Not difficult at all Not difficult at all    GAD 7 : Generalized Anxiety Score 04/05/2019 10/06/2018  Nervous, Anxious, on Edge 0 1  Control/stop worrying 0 0  Worry too much - different things 1 0  Trouble relaxing 0 0  Restless 0 0  Easily annoyed or irritable 0 0  Afraid - awful might happen 1 1  Total GAD 7 Score 2 2  Anxiety Difficulty Not difficult at all Not difficult at all    -------------------------------------------------------------------------- O: No physical exam performed due to remote telephone encounter.  Lab results reviewed.  Recent Results (from the past 2160 hour(s))  VITAMIN D 25 Hydroxy (Vit-D Deficiency, Fractures)     Status: Abnormal   Collection Time: 03/30/19  8:35 AM  Result Value Ref Range   Vit D, 25-Hydroxy 26 (L) 30 - 100 ng/mL    Comment: Vitamin D Status         25-OH Vitamin D: . Deficiency:                    <20 ng/mL Insufficiency:             20 - 29 ng/mL Optimal:                 > or = 30 ng/mL . For 25-OH Vitamin D testing on patients on  D2-supplementation and patients for whom quantitation  of D2 and D3 fractions is required, the QuestAssureD(TM) 25-OH VIT D, (D2,D3), LC/MS/MS is recommended: order  code 91791 (patients >50yrs). See Note 1 . Note 1 . For additional information, please refer to  http://education.Quest[facility].com/faq/FAQ199  (This link is being provided for informational/ educational purposes only.)   PSA     Status: None   Collection Time: 03/30/19  8:35 AM  Result Value Ref Range   PSA 0.4 < OR = 4.0 ng/mL    Comment: The total PSA value from this assay system is  standardized against the WHO standard. The test  result will be approximately 20% lower when compared  to the equimolar-standardized total PSA (Beckman  Coulter). Comparison of serial PSA results should be  interpreted with this fact in mind. . This test was performed using the Siemens  chemiluminescent method. Values  obtained from  different assay methods cannot be used interchangeably. PSA levels, regardless of value, should not be interpreted as absolute evidence of the presence or absence of disease.   Lipid panel     Status: Abnormal   Collection Time: 03/30/19  8:35 AM  Result Value Ref Range   Cholesterol 216 (H) <200 mg/dL   HDL 69 > OR = 40 mg/dL   Triglycerides 81 <505 mg/dL   LDL Cholesterol (Calc) 129 (H) mg/dL (calc)    Comment: Reference range: <100 . Desirable range <100 mg/dL for primary prevention;   <  70 mg/dL for patients with CHD or diabetic patients  with > or = 2 CHD risk factors. Marland Kitchen LDL-C is now calculated using the Martin-Hopkins  calculation, which is a validated novel method providing  better accuracy than the Friedewald equation in the  estimation of LDL-C.  Horald Pollen et al. Lenox Ahr. 1610;960(45): 2061-2068  (http://education.[facility].com/faq/FAQ164)    Total CHOL/HDL Ratio 3.1 <5.0 (calc)   Non-HDL Cholesterol (Calc) 147 (H) <130 mg/dL (calc)    Comment: For patients with diabetes plus 1 major ASCVD risk  factor, treating to a non-HDL-C goal of <100 mg/dL  (LDL-C of <40 mg/dL) is considered a therapeutic  option.   COMPLETE METABOLIC PANEL WITH GFR     Status: Abnormal   Collection Time: 03/30/19  8:35 AM  Result Value Ref Range   Glucose, Bld 120 (H) 65 - 99 mg/dL    Comment: .            Fasting reference interval . For someone without known diabetes, a glucose value between 100 and 125 mg/dL is consistent with prediabetes and should be confirmed with a follow-up test. .    BUN 15 7 - 25 mg/dL   Creat 9.81 1.91 - 4.78 mg/dL    Comment: For patients >68 years of age, the reference limit for Creatinine is approximately 13% higher for people identified as African-American. .    GFR, Est Non African American 65 > OR = 60 mL/min/1.95m2   GFR, Est African American 76 > OR = 60 mL/min/1.24m2   BUN/Creatinine Ratio NOT APPLICABLE 6 - 22 (calc)    Sodium 139 135 - 146 mmol/L   Potassium 3.9 3.5 - 5.3 mmol/L   Chloride 102 98 - 110 mmol/L   CO2 29 20 - 32 mmol/L   Calcium 9.6 8.6 - 10.3 mg/dL   Total Protein 6.5 6.1 - 8.1 g/dL   Albumin 4.3 3.6 - 5.1 g/dL   Globulin 2.2 1.9 - 3.7 g/dL (calc)   AG Ratio 2.0 1.0 - 2.5 (calc)   Total Bilirubin 0.8 0.2 - 1.2 mg/dL   Alkaline phosphatase (APISO) 52 35 - 144 U/L   AST 15 10 - 35 U/L   ALT 15 9 - 46 U/L  CBC with Differential/Platelet     Status: None   Collection Time: 03/30/19  8:35 AM  Result Value Ref Range   WBC 6.0 3.8 - 10.8 Thousand/uL   RBC 5.19 4.20 - 5.80 Million/uL   Hemoglobin 15.7 13.2 - 17.1 g/dL   HCT 29.5 62.1 - 30.8 %   MCV 87.1 80.0 - 100.0 fL   MCH 30.3 27.0 - 33.0 pg   MCHC 34.7 32.0 - 36.0 g/dL   RDW 65.7 84.6 - 96.2 %   Platelets 263 140 - 400 Thousand/uL   MPV 10.3 7.5 - 12.5 fL   Neutro Abs 3,852 1,500 - 7,800 cells/uL   Lymphs Abs 894 850 - 3,900 cells/uL   Absolute Monocytes 852 200 - 950 cells/uL   Eosinophils Absolute 360 15 - 500 cells/uL   Basophils Absolute 42 0 - 200 cells/uL   Neutrophils Relative % 64.2 %   Total Lymphocyte 14.9 %   Monocytes Relative 14.2 %   Eosinophils Relative 6.0 %   Basophils Relative 0.7 %  Hemoglobin A1c     Status: None   Collection Time: 03/30/19  8:35 AM  Result Value Ref Range   Hgb A1c MFr Bld 5.2 <5.7 % of total Hgb    Comment: For  the purpose of screening for the presence of diabetes: . <5.7%       Consistent with the absence of diabetes 5.7-6.4%    Consistent with increased risk for diabetes             (prediabetes) > or =6.5%  Consistent with diabetes . This assay result is consistent with a decreased risk of diabetes. . Currently, no consensus exists regarding use of hemoglobin A1c for diagnosis of diabetes in children. . According to American Diabetes Association (ADA) guidelines, hemoglobin A1c <7.0% represents optimal control in non-pregnant diabetic patients. Different metrics may apply  to specific patient populations.  Standards of Medical Care in Diabetes(ADA). .    Mean Plasma Glucose 103 (calc)   eAG (mmol/L) 5.7 (calc)    -------------------------------------------------------------------------- A&P:  Problem List Items Addressed This Visit    None    Visit Diagnoses    Strain of right shoulder, initial encounter    -  Primary   Relevant Medications   cyclobenzaprine (FLEXERIL) 10 MG tablet   naproxen (NAPROSYN) 500 MG tablet   Other Relevant Orders   Ambulatory referral to Orthopedic Surgery   Chronic right shoulder pain       Relevant Medications   cyclobenzaprine (FLEXERIL) 10 MG tablet   naproxen (NAPROSYN) 500 MG tablet   Other Relevant Orders   Ambulatory referral to Orthopedic Surgery   Shoulder impingement, right       Relevant Medications   cyclobenzaprine (FLEXERIL) 10 MG tablet   naproxen (NAPROSYN) 500 MG tablet   Other Relevant Orders   Ambulatory referral to Orthopedic Surgery     Consistent with acute R-shoulder pain and lifting injury 1 week ago, he has known chronic R shoulder pain episodic in past, with gradual wear and tear with repetitive work but no obvious injury - No red flag symptoms but has headache and neck pain, no significant numbness or tingling by his report - Significant prior L shoulder rotator cuff tear repair Dr Tania Ade [facility] 2014 has done well - No recent imaging  Plan: 1. Start rx Naproxen 500mg  twice daily (with food) for 2 weeks, then as needed - Muscle relaxant flexeril at night PRN caution sedation 2. May take Tylenol Ex Str 1-2 q 6 hr PRN 3. Relative rest but keep shoulder mobile, handout w/ ROM exercises, avoid heavy lifting 4. May try heating pad PRN  Referral to [facility] Dr Tania Ade in [place] - previous patient of his for Left Rotator Cuff Surgical repair in 2014, now patient with Right shoulder chronic pain and injury recently with heavy lifting, requesting return  to orthopedics for further imaging, treatment and consultation - he is Right handed it is limiting his function   May consider add Prednisone course if cannot get into ortho sooner and possibly subacromial injection if indicated - but advised need physical exam first.  Orders Placed This Encounter  Procedures  . Ambulatory referral to Orthopedic Surgery    Referral Priority:   Routine    Referral Type:   Surgical    Referral Reason:   Specialty Services Required    Referred to Provider:   Tania Ade, MD    Requested Specialty:   Orthopedic Surgery    Number of Visits Requested:   1     Meds ordered this encounter  Medications  . cyclobenzaprine (FLEXERIL) 10 MG tablet    Sig: Take 1 tablet (10 mg total) by mouth at bedtime as needed for muscle spasms.  Dispense:  30 tablet    Refill:  0  . naproxen (NAPROSYN) 500 MG tablet    Sig: Take 1 tablet (500 mg total) by mouth 2 (two) times daily with a meal. For 1-2 weeks then as needed    Dispense:  60 tablet    Refill:  0    Follow-up: - Return in 1 week as needed or physical as scheduled  Patient verbalizes understanding with the above medical recommendations including the limitation of remote medical advice.  Specific follow-up and call-back criteria were given for patient to follow-up or seek medical care more urgently if needed.   - Time spent in direct consultation with patient on phone: 12 minutes  Saralyn [name] Ansley Stanwood, DO [facility] 04/05/2019, 8:36 AM

## 2019-04-05 NOTE — Patient Instructions (Addendum)
Referral to [facility] Dr Tania Ade - stay tuned for apt  AVOID Heavy lifting and re-injury. It may be just a sprain and needs to rest and heal. Stay active with range of motion exercises but avoid straining it.  Will likely need X-ray, further imaging, may need injection or procedure  Start Cyclobenzapine (Flexeril) 10mg  tablets (muscle relaxant) - start with half (cut) to one whole pill at night for muscle relaxant - may make you sedated or sleepy (be careful driving or working on this) if tolerated you can take half to whole tab 2 to 3 times daily or every 8 hours as needed  Recommend trial of Anti-inflammatory with Naproxen (Naprosyn) 500mg  tabs - take one with food and plenty of water TWICE daily every day (breakfast and dinner), for next 2 to 4 weeks, then you may take only as needed - DO NOT TAKE any ibuprofen, aleve, motrin while you are taking this medicine - It is safe to take Tylenol Ext Str 500mg  tabs - take 1 to 2 (max dose 1000mg ) every 6 hours as needed for breakthrough pain, max 24 hour daily dose is 6 to 8 tablets or 4000mg   We can consider injection if need in a week or prednisone course.  Please schedule a Follow-up Appointment to: Return in about 1 week (around 04/12/2019), or if symptoms worsen or fail to improve, for shoulder pain.  If you have any other questions or concerns, please feel free to call the office or send a message through [place]. You may also schedule an earlier appointment if necessary.  Additionally, you may be receiving a survey about your experience at our office within a few days to 1 week by e-mail or mail. We value your feedback.  Nobie Putnam, DO [facility], [facility]  Range of Motion Shoulder Exercises  [place] with your good arm against a counter or table for support [facility] forward with a wide stance (make sure your body is comfortable) - Your painful shoulder should hang down and feel  "heavy" - Gently move your painful arm in small circles "clockwise" for several turns - Switch to "counterclockwise" for several turns - Early on keep circles narrow and move slowly - Later in rehab, move in larger circles and faster movement   Wall Crawl - Stand close (about 1-2 ft away) to a wall, facing it directly - Reach out with your arm of painful shoulder and place fingers (not palm) on wall - You should make contact with wall at your waist level - Slowly walk your fingers up the wall. Stay in contact with wall entire time, do not remove fingers - Keep walking fingers up wall until you reach shoulder level - You may feel tightening or mild discomfort, once you reach a height that causes pain or if you are already above your shoulder height then stop. Repeat from starting position. - Early on stand closer to wall, move fingers slowly, and stay at or below shoulder level - Later in rehab, stand farther away from wall (fingertips), move fingers quicker, go above shoulder level

## 2019-04-06 ENCOUNTER — Encounter: Payer: Managed Care, Other (non HMO) | Admitting: Family Medicine

## 2019-04-08 ENCOUNTER — Other Ambulatory Visit: Payer: Self-pay | Admitting: Family Medicine

## 2019-04-08 DIAGNOSIS — F5101 Primary insomnia: Secondary | ICD-10-CM

## 2019-04-13 ENCOUNTER — Ambulatory Visit (INDEPENDENT_AMBULATORY_CARE_PROVIDER_SITE_OTHER): Payer: No Typology Code available for payment source | Admitting: Family Medicine

## 2019-04-13 ENCOUNTER — Encounter: Payer: Self-pay | Admitting: Family Medicine

## 2019-04-13 ENCOUNTER — Other Ambulatory Visit: Payer: Self-pay

## 2019-04-13 VITALS — BP 116/75 | HR 70 | Temp 97.6°F | Resp 16 | Ht 69.0 in | Wt 176.0 lb

## 2019-04-13 DIAGNOSIS — Z0000 Encounter for general adult medical examination without abnormal findings: Secondary | ICD-10-CM

## 2019-04-13 DIAGNOSIS — Z1211 Encounter for screening for malignant neoplasm of colon: Secondary | ICD-10-CM | POA: Diagnosis not present

## 2019-04-13 DIAGNOSIS — F32A Depression, unspecified: Secondary | ICD-10-CM

## 2019-04-13 DIAGNOSIS — Z23 Encounter for immunization: Secondary | ICD-10-CM

## 2019-04-13 DIAGNOSIS — I420 Dilated cardiomyopathy: Secondary | ICD-10-CM

## 2019-04-13 DIAGNOSIS — F5101 Primary insomnia: Secondary | ICD-10-CM | POA: Diagnosis not present

## 2019-04-13 DIAGNOSIS — E782 Mixed hyperlipidemia: Secondary | ICD-10-CM

## 2019-04-13 DIAGNOSIS — F329 Major depressive disorder, single episode, unspecified: Secondary | ICD-10-CM

## 2019-04-13 DIAGNOSIS — N182 Chronic kidney disease, stage 2 (mild): Secondary | ICD-10-CM

## 2019-04-13 DIAGNOSIS — I129 Hypertensive chronic kidney disease with stage 1 through stage 4 chronic kidney disease, or unspecified chronic kidney disease: Secondary | ICD-10-CM

## 2019-04-13 DIAGNOSIS — F419 Anxiety disorder, unspecified: Secondary | ICD-10-CM

## 2019-04-13 MED ORDER — ALPRAZOLAM 1 MG PO TABS: 1.0000 mg | ORAL_TABLET | Freq: Every evening | ORAL | 2 refills | Status: DC | PRN

## 2019-04-13 NOTE — Assessment & Plan Note (Signed)
Improved and and well controlled Checked [place] CSRS for 2 years  Plan: 1. Refilled Xanax 1mg  at night QHS PRN #30 +2 refills for 3 month supply - refill when request 3 months Continue Trazodone

## 2019-04-13 NOTE — Assessment & Plan Note (Signed)
Controlled HTN currently - Home BP readings normal  Prior mild CKD-II, stable now    Plan:  1. Continue current BP regimen - Amlodipine 10mg  daily 2. Encourage improved lifestyle - low sodium diet, regular exercise 3. Continue monitor BP outside office, bring readings to next visit, if persistently >140/90 or new symptoms notify office sooner

## 2019-04-13 NOTE — Assessment & Plan Note (Signed)
Resolved Last ECHO, repeated 12/2016 showed normal LV function and size resolved from prior strain during acute hospitalization renal failure

## 2019-04-13 NOTE — Assessment & Plan Note (Signed)
Controlled with improved sleep/insomnia Maintained on trazodone / xanax nightly

## 2019-04-13 NOTE — Progress Notes (Signed)
Subjective:    Patient ID: Brett PaganBradley T Huber, male    DOB: 01/26/1966, 53 y.o.   MRN: [id number]  Brett Huber is a 53 y.o. male presenting on 04/13/2019 for Annual Exam   HPI   Here for Annual Physical and Lab Review.  CHRONIC HTN CKD II Reportshome BP readings remain improved and controlled on current med. Current Meds -Amlodipine 10mg  daily Reports good compliance, took meds today. Tolerating well, w/o complaints.  Follow-up Contact Dermatitis, bilateral hands Improved on topical steroid PRN  HYPERLIPIDEMIA: - Reports no concerns. Last lipid panel 03/2019, mild elevated LDL 129 Not on statin.  FOLLOW-UP INSOMNIA, Chronic: See prior notes, has been stable on current med management now. - Feeling really well. Sleeping through the night. - taking Trazodone 100mg  nightly - Taking Xanax 1mg  = one whole 1mg  pill 4-5 days a week before bed for insomnia on work days, and occasionally takes a half pill for a few nights before bed if weekend  Follow-up R shoulder Pain Improved, 50%, on meds, over 3 weeks now - awaiting to schedule w/ ortho now Dr Jackquline BoschJesse Chandler - flexeril helpful, he can notify us if need more.  Health Maintenance: Due for Flu Shot, will receive today   Colon CA Screening: Never had colonoscopy. Currently asymptomatic. No known family history of colon CA. Due for screening test, order cologuard.  Prostate CA Screening: No prior prostate CA screening Last PSA 0.4 (03/2019). Currently asymptomatic. No known family history of prostate CA.    Dep[facility] screen [facility] 04/13/2019 04/05/2019 10/06/2018  Decreased Interest 0 1 0  Down, Depressed, Hopeless 0 1 0  PHQ - 2 Score 0 2 0  Altered sleeping 0 0 0  Tired, decreased energy 0 0 0  Change in appetite 0 0 0  Feeling bad or failure about yourself  0 0 0  Trouble concentrating 0 0 0  Moving slowly or fidgety/restless 0 0 0  Suicidal thoughts 0 0 0  PHQ-9 Score 0 2 0  Difficult doing work/chores  Not difficult at all Not difficult at all Not difficult at all   GAD 7 : Generalized Anxiety Score 04/13/2019 04/05/2019 10/06/2018  Nervous, Anxious, on Edge 0 0 1  Control/stop worrying 0 0 0  Worry too much - different things 0 1 0  Trouble relaxing 0 0 0  Restless 0 0 0  Easily annoyed or irritable 0 0 0  Afraid - awful might happen 0 1 1  Total GAD 7 Score 0 2 2  Anxiety Difficulty Not difficult at all Not difficult at all Not difficult at all     Past Medical History:  Diagnosis Date  . Anxiety   . Chronic pain   . DDD (degenerative disc disease)   . Hyperlipidemia   . Hypertension   . Insomnia   . Sinus congestion    Past Surgical History:  Procedure Laterality Date  . CERVICAL FUSION  2004  . CHOLECYSTECTOMY  2007  . neck and disc replacement    . SHOULDER ARTHROSCOPY WITH ROTATOR CUFF REPAIR AND SUBACROMIAL DECOMPRESSION Left 01/29/2013   Procedure: LEFT SHOULDER ARTHROSCOPY WITH ROTATOR CUFF REPAIR AND SUBACROMIAL DECOMPRESSION DISTAL CLAVICLE RESECTION;  Surgeon: Mable ParisJustin William Chandler, MD;  Location: [facility];  Service: Orthopedics;  Laterality: Left;   Social History   Socioeconomic History  . Marital status: Married    Spouse name: Not on file  . Number of children: Not on file  . Years of education:  Not on file  . Highest education level: Not on file  Occupational History  . Not on file  Social Needs  . Financial resource strain: Not on file  . Food insecurity    Worry: Not on file    Inability: Not on file  . Transportation needs    Medical: Not on file    Non-medical: Not on file  Tobacco Use  . Smoking status: Never Smoker  . Smokeless tobacco: Current User    Types: Chew  Substance and Sexual Activity  . Alcohol use: Yes    Comment: occ  . Drug use: No  . Sexual activity: Not on file  Lifestyle  . Physical activity    Days per week: Not on file    Minutes per session: Not on file  . Stress: Not on file  Relationships   . Social Musician on phone: Not on file    Gets together: Not on file    Attends religious service: Not on file    Active member of club or organization: Not on file    Attends meetings of clubs or organizations: Not on file    Relationship status: Not on file  . Intimate partner violence    Fear of current or ex partner: Not on file    Emotionally abused: Not on file    Physically abused: Not on file    Forced sexual activity: Not on file  Other Topics Concern  . Not on file  Social History Narrative  . Not on file   Family History  Problem Relation Age of Onset  . Diabetes Mother   . Stroke Father   . Prostate cancer Neg Hx   . Kidney cancer Neg Hx   . Bladder Cancer Neg Hx    Current Outpatient Medications on File Prior to Visit  Medication Sig  . amLODipine (NORVASC) 10 MG tablet Take 1 tablet (10 mg total) by mouth daily.  . cyclobenzaprine (FLEXERIL) 10 MG tablet Take 1 tablet (10 mg total) by mouth at bedtime as needed for muscle spasms.  . naproxen (NAPROSYN) 500 MG tablet Take 1 tablet (500 mg total) by mouth 2 (two) times daily with a meal. For 1-2 weeks then as needed  . traZODone (DESYREL) 100 MG tablet TAKE 1 TABLET BY MOUTH AT BEDTIME  . triamcinolone ointment (KENALOG) 0.5 % Apply 1 application topically 2 (two) times daily. 1-2 weeks, for hands.   No current facility-administered medications on file prior to visit.     Review of Systems  Constitutional: Negative for activity change, appetite change, chills, diaphoresis, fatigue and fever.  HENT: Negative for congestion and hearing loss.   Eyes: Negative for visual disturbance.  Respiratory: Negative for apnea, cough, chest tightness, shortness of breath and wheezing.   Cardiovascular: Negative for chest pain, palpitations and leg swelling.  Gastrointestinal: Negative for abdominal pain, anal bleeding, blood in stool, constipation, diarrhea, nausea and vomiting.  Endocrine: Negative for cold  intolerance.  Genitourinary: Negative for decreased urine volume, difficulty urinating, dysuria, frequency, hematuria and urgency.  Musculoskeletal: Positive for arthralgias (r shoulder). Negative for back pain and neck pain.  Skin: Negative for rash.  Allergic/Immunologic: Negative for environmental allergies.  Neurological: Negative for dizziness, weakness, light-headedness, numbness and headaches.  Hematological: Negative for adenopathy.  Psychiatric/Behavioral: Negative for behavioral problems, dysphoric mood and sleep disturbance (controlled).   Per HPI unless specifically indicated above      Objective:    BP 116/75  Pulse 70   Temp 97.6 F (36.4 C) (Oral)   Resp 16   Ht  (1.753 m)   Wt 176 lb (79.8 kg)   BMI 25.99 kg/m   Wt Readings from Last 3 Encounters:  04/13/19 176 lb (79.8 kg)  03/08/18 179 lb (81.2 kg)  05/06/17 193 lb (87.5 kg)    Physical Exam Vitals signs and nursing note reviewed.  Constitutional:      General: He is not in acute distress.    Appearance: He is well-developed. He is not diaphoretic.     Comments: Well-appearing, comfortable, cooperative  HENT:     Head: Normocephalic and atraumatic.  Eyes:     General:        Right eye: No discharge.        Left eye: No discharge.     Conjunctiva/sclera: Conjunctivae normal.     Pupils: Pupils are equal, round, and reactive to light.  Neck:     Musculoskeletal: Normal range of motion and neck supple.     Thyroid: No thyromegaly.     Comments: No carotid bruit Cardiovascular:     Rate and Rhythm: Normal rate and regular rhythm.     Heart sounds: Normal heart sounds. No murmur.  Pulmonary:     Effort: Pulmonary effort is normal. No respiratory distress.     Breath sounds: Normal breath sounds. No wheezing or rales.  Abdominal:     General: Bowel sounds are normal. There is no distension.     Palpations: Abdomen is soft. There is no mass.     Tenderness: There is no abdominal tenderness.   Musculoskeletal: Normal range of motion.        General: No tenderness.     Comments: Upper / Lower Extremities: - Normal muscle tone, strength bilateral upper extremities 5/5, lower extremities 5/5  R shoulder reduced AROM. Not focus of visit today  Lymphadenopathy:     Cervical: No cervical adenopathy.  Skin:    General: Skin is warm and dry.     Findings: No erythema or rash.  Neurological:     Mental Status: He is alert and oriented to person, place, and time.     Comments: Distal sensation intact to light touch all extremities  Psychiatric:        Behavior: Behavior normal.     Comments: Well groomed, good eye contact, normal speech and thoughts    Results for orders placed or performed in visit on 03/30/19  VITAMIN D 25 Hydroxy (Vit-D Deficiency, Fractures)  Result Value Ref Range   Vit D, 25-Hydroxy 26 (L) 30 - 100 ng/mL  PSA  Result Value Ref Range   PSA 0.4 < OR = 4.0 ng/mL  Lipid panel  Result Value Ref Range   Cholesterol 216 (H) <200 mg/dL   HDL 69 > OR = 40 mg/dL   Triglycerides 81 <161 mg/dL   LDL Cholesterol (Calc) 129 (H) mg/dL (calc)   Total CHOL/HDL Ratio 3.1 <5.0 (calc)   Non-HDL Cholesterol (Calc) 147 (H) <130 mg/dL (calc)  COMPLETE METABOLIC PANEL WITH GFR  Result Value Ref Range   Glucose, Bld 120 (H) 65 - 99 mg/dL   BUN 15 7 - 25 mg/dL   Creat 0.96 0.45 - 4.09 mg/dL   GFR, Est Non African American 65 > OR = 60 mL/min/1.81m2   GFR, Est African American 76 > OR = 60 mL/min/1.61m2   BUN/Creatinine Ratio NOT APPLICABLE 6 - 22 (calc)   Sodium 139  135 - 146 mmol/L   Potassium 3.9 3.5 - 5.3 mmol/L   Chloride 102 98 - 110 mmol/L   CO2 29 20 - 32 mmol/L   Calcium 9.6 8.6 - 10.3 mg/dL   Total Protein 6.5 6.1 - 8.1 g/dL   Albumin 4.3 3.6 - 5.1 g/dL   Globulin 2.2 1.9 - 3.7 g/dL (calc)   AG Ratio 2.0 1.0 - 2.5 (calc)   Total Bilirubin 0.8 0.2 - 1.2 mg/dL   Alkaline phosphatase (APISO) 52 35 - 144 U/L   AST 15 10 - 35 U/L   ALT 15 9 - 46 U/L  CBC with  Differential/Platelet  Result Value Ref Range   WBC 6.0 3.8 - 10.8 Thousand/uL   RBC 5.19 4.20 - 5.80 Million/uL   Hemoglobin 15.7 13.2 - 17.1 g/dL   HCT 45.2 38.5 - 50.0 %   MCV 87.1 80.0 - 100.0 fL   MCH 30.3 27.0 - 33.0 pg   MCHC 34.7 32.0 - 36.0 g/dL   RDW 12.9 11.0 - 15.0 %   Platelets 263 140 - 400 Thousand/uL   MPV 10.3 7.5 - 12.5 fL   Neutro Abs 3,852 1,500 - 7,800 cells/uL   Lymphs Abs 894 850 - 3,900 cells/uL   Absolute Monocytes 852 200 - 950 cells/uL   Eosinophils Absolute 360 15 - 500 cells/uL   Basophils Absolute 42 0 - 200 cells/uL   Neutrophils Relative % 64.2 %   Total Lymphocyte 14.9 %   Monocytes Relative 14.2 %   Eosinophils Relative 6.0 %   Basophils Relative 0.7 %  Hemoglobin A1c  Result Value Ref Range   Hgb A1c MFr Bld 5.2 <5.7 % of total Hgb   Mean Plasma Glucose 103 (calc)   eAG (mmol/L) 5.7 (calc)      Assessment & Plan:   Problem List Items Addressed This Visit    Anxiety and depression    Controlled with improved sleep/insomnia Maintained on trazodone / xanax nightly      Relevant Medications   ALPRAZolam (XANAX) 1 MG tablet   Benign hypertension with CKD (chronic kidney disease), stage II    Controlled HTN currently - Home BP readings normal  Prior mild CKD-II, stable now    Plan:  1. Continue current BP regimen - Amlodipine 10mg  daily 2. Encourage improved lifestyle - low sodium diet, regular exercise 3. Continue monitor BP outside office, bring readings to next visit, if persistently >140/90 or new symptoms notify office sooner      RESOLVED: Cardiomyopathy ([place])    Resolved Last ECHO, repeated 12/2016 showed normal LV function and size resolved from prior strain during acute hospitalization renal failure      CKD (chronic kidney disease), stage II   Hyperlipidemia    Mostly controlled mild elevated LDL Not on statin therapy Lifestyle Follow up yearly       Insomnia    Improved and and well controlled Checked [place] CSRS  for 2 years  Plan: 1. Refilled Xanax 1mg  at night QHS PRN #30 +2 refills for 3 month supply - refill when request 3 months Continue Trazodone      Relevant Medications   ALPRAZolam (XANAX) 1 MG tablet    Other Visit Diagnoses    Annual physical exam    -  Primary   Needs flu shot       Relevant Orders   Flu Vaccine QUAD 36+ mos IM (Completed)   Screening for colon cancer  Relevant Orders   Cologuard       Updated Health Maintenance information Flu shot Cologuard ordered Reviewed recent lab results with patient Encouraged improvement to lifestyle with diet and exercise Maintain weight    Meds ordered this encounter  Medications  . ALPRAZolam (XANAX) 1 MG tablet    Sig: Take 1 tablet (1 mg total) by mouth at bedtime as needed for anxiety or sleep.    Dispense:  30 tablet    Refill:  2    Follow up plan: Return in about 6 months (around 10/12/2019) for 6 month med refills, insomnia.  Saralyn Pilar, DO [facility] 04/13/2019, 2:35 PM

## 2019-04-13 NOTE — Patient Instructions (Addendum)
Thank you for coming to the office today.  Flu shot today.  Keep on current meds, notify for refill when ready of meds.  Cologuard ordered.  Let me know at least 48 hours in advance if need more flexeril re ordered, send mychart  Please schedule a Follow-up Appointment to: Return in about 6 months (around 10/12/2019) for 6 month med refills, insomnia.  If you have any other questions or concerns, please feel free to call the office or send a message through [facility]. You may also schedule an earlier appointment if necessary.  Additionally, you may be receiving a survey about your experience at our office within a few days to 1 week by e-mail or mail. We value your feedback.  Nobie Putnam, DO [place]

## 2019-04-13 NOTE — Assessment & Plan Note (Signed)
Mostly controlled mild elevated LDL Not on statin therapy Lifestyle Follow up yearly

## 2019-05-22 LAB — COLOGUARD: Cologuard: NEGATIVE

## 2019-05-29 ENCOUNTER — Other Ambulatory Visit: Payer: Self-pay

## 2019-05-29 ENCOUNTER — Encounter: Payer: Self-pay | Admitting: Emergency Medicine

## 2019-05-29 DIAGNOSIS — G4700 Insomnia, unspecified: Secondary | ICD-10-CM | POA: Diagnosis present

## 2019-05-29 DIAGNOSIS — E785 Hyperlipidemia, unspecified: Secondary | ICD-10-CM | POA: Diagnosis present

## 2019-05-29 DIAGNOSIS — Z91030 Bee allergy status: Secondary | ICD-10-CM

## 2019-05-29 DIAGNOSIS — R1013 Epigastric pain: Secondary | ICD-10-CM | POA: Diagnosis not present

## 2019-05-29 DIAGNOSIS — M62830 Muscle spasm of back: Secondary | ICD-10-CM | POA: Diagnosis not present

## 2019-05-29 DIAGNOSIS — K838 Other specified diseases of biliary tract: Secondary | ICD-10-CM | POA: Diagnosis present

## 2019-05-29 DIAGNOSIS — F419 Anxiety disorder, unspecified: Secondary | ICD-10-CM | POA: Diagnosis present

## 2019-05-29 DIAGNOSIS — Z9049 Acquired absence of other specified parts of digestive tract: Secondary | ICD-10-CM

## 2019-05-29 DIAGNOSIS — G8929 Other chronic pain: Secondary | ICD-10-CM | POA: Diagnosis present

## 2019-05-29 DIAGNOSIS — Z20828 Contact with and (suspected) exposure to other viral communicable diseases: Secondary | ICD-10-CM | POA: Diagnosis present

## 2019-05-29 DIAGNOSIS — N182 Chronic kidney disease, stage 2 (mild): Secondary | ICD-10-CM | POA: Diagnosis present

## 2019-05-29 DIAGNOSIS — Z981 Arthrodesis status: Secondary | ICD-10-CM

## 2019-05-29 DIAGNOSIS — F1020 Alcohol dependence, uncomplicated: Secondary | ICD-10-CM | POA: Diagnosis present

## 2019-05-29 DIAGNOSIS — K859 Acute pancreatitis without necrosis or infection, unspecified: Secondary | ICD-10-CM | POA: Diagnosis not present

## 2019-05-29 DIAGNOSIS — I129 Hypertensive chronic kidney disease with stage 1 through stage 4 chronic kidney disease, or unspecified chronic kidney disease: Secondary | ICD-10-CM | POA: Diagnosis present

## 2019-05-29 DIAGNOSIS — F17220 Nicotine dependence, chewing tobacco, uncomplicated: Secondary | ICD-10-CM | POA: Diagnosis present

## 2019-05-29 DIAGNOSIS — Z79899 Other long term (current) drug therapy: Secondary | ICD-10-CM

## 2019-05-29 LAB — CBC
HCT: 43.2 % (ref 39.0–52.0)
Hemoglobin: 15.9 g/dL (ref 13.0–17.0)
MCH: 30.6 pg (ref 26.0–34.0)
MCHC: 36.8 g/dL — ABNORMAL HIGH (ref 30.0–36.0)
MCV: 83.2 fL (ref 80.0–100.0)
Platelets: 260 10*3/uL (ref 150–400)
RBC: 5.19 MIL/uL (ref 4.22–5.81)
RDW: 12.8 % (ref 11.5–15.5)
WBC: 21.8 10*3/uL — ABNORMAL HIGH (ref 4.0–10.5)
nRBC: 0 % (ref 0.0–0.2)

## 2019-05-29 LAB — URINALYSIS, COMPLETE (UACMP) WITH MICROSCOPIC
Bilirubin Urine: NEGATIVE
Glucose, UA: NEGATIVE mg/dL
Ketones, ur: NEGATIVE mg/dL
Leukocytes,Ua: NEGATIVE
Nitrite: NEGATIVE
Protein, ur: 30 mg/dL — AB
Specific Gravity, Urine: 1.017 (ref 1.005–1.030)
pH: 7 (ref 5.0–8.0)

## 2019-05-29 LAB — COMPREHENSIVE METABOLIC PANEL
ALT: 18 U/L (ref 0–44)
AST: 22 U/L (ref 15–41)
Albumin: 4.4 g/dL (ref 3.5–5.0)
Alkaline Phosphatase: 43 U/L (ref 38–126)
Anion gap: 13 (ref 5–15)
BUN: 12 mg/dL (ref 6–20)
CO2: 24 mmol/L (ref 22–32)
Calcium: 9.1 mg/dL (ref 8.9–10.3)
Chloride: 98 mmol/L (ref 98–111)
Creatinine, Ser: 0.9 mg/dL (ref 0.61–1.24)
GFR calc Af Amer: >60 mL/min (ref >60)
GFR calc non Af Amer: >60 mL/min (ref >60)
Glucose, Bld: 144 mg/dL — ABNORMAL HIGH (ref 70–99)
Potassium: 3.5 mmol/L (ref 3.5–5.1)
Sodium: 135 mmol/L (ref 135–145)
Total Bilirubin: 1.7 mg/dL — ABNORMAL HIGH (ref 0.3–1.2)
Total Protein: 7.6 g/dL (ref 6.5–8.1)

## 2019-05-29 LAB — LIPASE, BLOOD: Lipase: 145 U/L — ABNORMAL HIGH (ref 11–51)

## 2019-05-29 MED ORDER — SODIUM CHLORIDE 0.9 % IV BOLUS
1000.0000 mL | Freq: Once | INTRAVENOUS | Status: AC
  Administered 2019-05-29: 1000 mL via INTRAVENOUS

## 2019-05-29 MED ORDER — HYDROMORPHONE HCL 1 MG/ML IJ SOLN
1.0000 mg | Freq: Once | INTRAMUSCULAR | Status: AC
  Administered 2019-05-29: 1 mg via INTRAVENOUS
  Filled 2019-05-29: qty 1

## 2019-05-29 MED ORDER — IOHEXOL 9 MG/ML PO SOLN
500.0000 mL | ORAL | Status: AC
  Administered 2019-05-29: 500 mL via ORAL

## 2019-05-29 MED ORDER — ONDANSETRON HCL 4 MG/2ML IJ SOLN
4.0000 mg | Freq: Once | INTRAMUSCULAR | Status: AC
  Administered 2019-05-29: 4 mg via INTRAVENOUS
  Filled 2019-05-29: qty 2

## 2019-05-29 NOTE — ED Notes (Signed)
Pt attempting to use bathroom

## 2019-05-29 NOTE — ED Triage Notes (Addendum)
Pt arrived via POV with reports of upper abdominal pain that started today after his pain medication wore off from right RTC surgery yesterday.   Pt reports pain feels like previous pancreatitis that he had several years ago.  Pt states he took percocet this morning for pain.

## 2019-05-30 ENCOUNTER — Inpatient Hospital Stay
Admission: EM | Admit: 2019-05-30 | Discharge: 2019-06-03 | DRG: 440 | Disposition: A | Discharge status: Home / Self Care | Payer: PRIVATE HEALTH INSURANCE | Attending: Family Medicine | Admitting: Family Medicine

## 2019-05-30 ENCOUNTER — Emergency Department: Payer: PRIVATE HEALTH INSURANCE

## 2019-05-30 ENCOUNTER — Encounter: Payer: Self-pay | Admitting: Family Medicine

## 2019-05-30 ENCOUNTER — Inpatient Hospital Stay: Payer: PRIVATE HEALTH INSURANCE

## 2019-05-30 DIAGNOSIS — R52 Pain, unspecified: Secondary | ICD-10-CM

## 2019-05-30 DIAGNOSIS — R1013 Epigastric pain: Secondary | ICD-10-CM | POA: Diagnosis present

## 2019-05-30 DIAGNOSIS — F419 Anxiety disorder, unspecified: Secondary | ICD-10-CM | POA: Diagnosis present

## 2019-05-30 DIAGNOSIS — R112 Nausea with vomiting, unspecified: Secondary | ICD-10-CM

## 2019-05-30 DIAGNOSIS — E782 Mixed hyperlipidemia: Secondary | ICD-10-CM | POA: Diagnosis not present

## 2019-05-30 DIAGNOSIS — G4700 Insomnia, unspecified: Secondary | ICD-10-CM | POA: Diagnosis present

## 2019-05-30 DIAGNOSIS — Z981 Arthrodesis status: Secondary | ICD-10-CM | POA: Diagnosis not present

## 2019-05-30 DIAGNOSIS — I129 Hypertensive chronic kidney disease with stage 1 through stage 4 chronic kidney disease, or unspecified chronic kidney disease: Secondary | ICD-10-CM | POA: Diagnosis present

## 2019-05-30 DIAGNOSIS — G8929 Other chronic pain: Secondary | ICD-10-CM | POA: Diagnosis present

## 2019-05-30 DIAGNOSIS — Z9049 Acquired absence of other specified parts of digestive tract: Secondary | ICD-10-CM | POA: Diagnosis not present

## 2019-05-30 DIAGNOSIS — N182 Chronic kidney disease, stage 2 (mild): Secondary | ICD-10-CM

## 2019-05-30 DIAGNOSIS — K859 Acute pancreatitis without necrosis or infection, unspecified: Secondary | ICD-10-CM | POA: Diagnosis present

## 2019-05-30 DIAGNOSIS — E785 Hyperlipidemia, unspecified: Secondary | ICD-10-CM | POA: Diagnosis present

## 2019-05-30 DIAGNOSIS — K838 Other specified diseases of biliary tract: Secondary | ICD-10-CM | POA: Diagnosis present

## 2019-05-30 DIAGNOSIS — M62830 Muscle spasm of back: Secondary | ICD-10-CM | POA: Diagnosis not present

## 2019-05-30 DIAGNOSIS — F17220 Nicotine dependence, chewing tobacco, uncomplicated: Secondary | ICD-10-CM | POA: Diagnosis present

## 2019-05-30 DIAGNOSIS — F1020 Alcohol dependence, uncomplicated: Secondary | ICD-10-CM | POA: Diagnosis present

## 2019-05-30 DIAGNOSIS — K852 Alcohol induced acute pancreatitis without necrosis or infection: Secondary | ICD-10-CM | POA: Diagnosis not present

## 2019-05-30 DIAGNOSIS — Z91030 Bee allergy status: Secondary | ICD-10-CM | POA: Diagnosis not present

## 2019-05-30 DIAGNOSIS — Z79899 Other long term (current) drug therapy: Secondary | ICD-10-CM | POA: Diagnosis not present

## 2019-05-30 DIAGNOSIS — Z20828 Contact with and (suspected) exposure to other viral communicable diseases: Secondary | ICD-10-CM | POA: Diagnosis present

## 2019-05-30 LAB — LIPID PANEL
Cholesterol: 205 mg/dL — ABNORMAL HIGH (ref 0–200)
HDL: 65 mg/dL (ref >40)
LDL Cholesterol: 121 mg/dL — ABNORMAL HIGH (ref 0–99)
Total CHOL/HDL Ratio: 3.2 RATIO
Triglycerides: 96 mg/dL (ref <150)
VLDL: 19 mg/dL (ref 0–40)

## 2019-05-30 LAB — COMPREHENSIVE METABOLIC PANEL
ALT: 24 U/L (ref 0–44)
AST: 31 U/L (ref 15–41)
Albumin: 3.9 g/dL (ref 3.5–5.0)
Alkaline Phosphatase: 44 U/L (ref 38–126)
Anion gap: 8 (ref 5–15)
BUN: 10 mg/dL (ref 6–20)
CO2: 27 mmol/L (ref 22–32)
Calcium: 8.7 mg/dL — ABNORMAL LOW (ref 8.9–10.3)
Chloride: 102 mmol/L (ref 98–111)
Creatinine, Ser: 0.92 mg/dL (ref 0.61–1.24)
GFR calc Af Amer: >60 mL/min (ref >60)
GFR calc non Af Amer: >60 mL/min (ref >60)
Glucose, Bld: 128 mg/dL — ABNORMAL HIGH (ref 70–99)
Potassium: 3.9 mmol/L (ref 3.5–5.1)
Sodium: 137 mmol/L (ref 135–145)
Total Bilirubin: 2.4 mg/dL — ABNORMAL HIGH (ref 0.3–1.2)
Total Protein: 6.8 g/dL (ref 6.5–8.1)

## 2019-05-30 LAB — CBC
HCT: 40.1 % (ref 39.0–52.0)
Hemoglobin: 14.6 g/dL (ref 13.0–17.0)
MCH: 30.2 pg (ref 26.0–34.0)
MCHC: 36.4 g/dL — ABNORMAL HIGH (ref 30.0–36.0)
MCV: 82.9 fL (ref 80.0–100.0)
Platelets: 228 10*3/uL (ref 150–400)
RBC: 4.84 MIL/uL (ref 4.22–5.81)
RDW: 13.2 % (ref 11.5–15.5)
WBC: 21.7 10*3/uL — ABNORMAL HIGH (ref 4.0–10.5)
nRBC: 0 % (ref 0.0–0.2)

## 2019-05-30 LAB — LACTATE DEHYDROGENASE: LDH: 228 U/L — ABNORMAL HIGH (ref 98–192)

## 2019-05-30 LAB — HIV ANTIBODY (ROUTINE TESTING W REFLEX): HIV Screen 4th Generation wRfx: NONREACTIVE

## 2019-05-30 LAB — SARS CORONAVIRUS 2 (TAT 6-24 HRS): SARS Coronavirus 2: NEGATIVE

## 2019-05-30 LAB — LIPASE, BLOOD: Lipase: 165 U/L — ABNORMAL HIGH (ref 11–51)

## 2019-05-30 MED ORDER — ONDANSETRON HCL 4 MG/2ML IJ SOLN
4.0000 mg | Freq: Four times a day (QID) | INTRAMUSCULAR | Status: DC | PRN
  Administered 2019-05-31 – 2019-06-01 (×3): 4 mg via INTRAVENOUS
  Filled 2019-05-30 (×3): qty 2

## 2019-05-30 MED ORDER — CYCLOBENZAPRINE HCL 10 MG PO TABS
10.0000 mg | ORAL_TABLET | Freq: Every evening | ORAL | Status: DC | PRN
  Administered 2019-06-02: 10 mg via ORAL
  Filled 2019-05-30: qty 1

## 2019-05-30 MED ORDER — ONDANSETRON HCL 4 MG/2ML IJ SOLN
4.0000 mg | Freq: Once | INTRAMUSCULAR | Status: AC
  Administered 2019-05-30: 4 mg via INTRAVENOUS
  Filled 2019-05-30: qty 2

## 2019-05-30 MED ORDER — HYDROMORPHONE HCL 1 MG/ML IJ SOLN
1.0000 mg | INTRAMUSCULAR | Status: DC | PRN
  Administered 2019-05-30 – 2019-05-31 (×11): 1 mg via INTRAVENOUS
  Filled 2019-05-30 (×11): qty 1

## 2019-05-30 MED ORDER — DIPHENHYDRAMINE HCL 50 MG/ML IJ SOLN
25.0000 mg | Freq: Four times a day (QID) | INTRAMUSCULAR | Status: DC | PRN
  Administered 2019-05-30: 25 mg via INTRAVENOUS
  Filled 2019-05-30: qty 1

## 2019-05-30 MED ORDER — AMLODIPINE BESYLATE 10 MG PO TABS
10.0000 mg | ORAL_TABLET | Freq: Every day | ORAL | Status: DC
  Administered 2019-05-30 – 2019-06-03 (×5): 10 mg via ORAL
  Filled 2019-05-30 (×4): qty 1
  Filled 2019-05-30: qty 2

## 2019-05-30 MED ORDER — IOHEXOL 300 MG/ML  SOLN
100.0000 mL | Freq: Once | INTRAMUSCULAR | Status: AC | PRN
  Administered 2019-05-30: 100 mL via INTRAVENOUS

## 2019-05-30 MED ORDER — SODIUM CHLORIDE 0.9 % IV BOLUS
1000.0000 mL | Freq: Once | INTRAVENOUS | Status: AC
  Administered 2019-05-30: 1000 mL via INTRAVENOUS

## 2019-05-30 MED ORDER — SODIUM CHLORIDE 0.9 % IV SOLN
INTRAVENOUS | Status: AC
  Administered 2019-05-30 (×3): via INTRAVENOUS

## 2019-05-30 MED ORDER — HYDROMORPHONE HCL 1 MG/ML IJ SOLN
1.0000 mg | INTRAMUSCULAR | Status: DC | PRN
  Administered 2019-05-30 (×3): 1 mg via INTRAVENOUS
  Filled 2019-05-30 (×3): qty 1

## 2019-05-30 MED ORDER — ALPRAZOLAM 0.5 MG PO TABS: 1.0000 mg | ORAL_TABLET | Freq: Every evening | ORAL | Status: DC | PRN

## 2019-05-30 MED ORDER — TRAZODONE HCL 100 MG PO TABS
100.0000 mg | ORAL_TABLET | Freq: Every day | ORAL | Status: DC
  Administered 2019-05-30 – 2019-06-02 (×4): 100 mg via ORAL
  Filled 2019-05-30 (×4): qty 1

## 2019-05-30 MED ORDER — MORPHINE SULFATE (PF) 4 MG/ML IV SOLN
4.0000 mg | Freq: Once | INTRAVENOUS | Status: AC
  Administered 2019-05-30: 4 mg via INTRAVENOUS
  Filled 2019-05-30: qty 1

## 2019-05-30 MED ORDER — ENOXAPARIN SODIUM 40 MG/0.4ML ~~LOC~~ SOLN
40.0000 mg | SUBCUTANEOUS | Status: DC
  Administered 2019-05-31 – 2019-06-03 (×4): 40 mg via SUBCUTANEOUS
  Filled 2019-05-30 (×6): qty 0.4

## 2019-05-30 MED ORDER — GADOBUTROL 1 MMOL/ML IV SOLN
7.5000 mL | Freq: Once | INTRAVENOUS | Status: AC | PRN
  Administered 2019-05-30: 7.5 mL via INTRAVENOUS
  Filled 2019-05-30: qty 7.5

## 2019-05-30 MED ORDER — PANTOPRAZOLE SODIUM 40 MG IV SOLR
40.0000 mg | Freq: Two times a day (BID) | INTRAVENOUS | Status: DC
  Administered 2019-05-30 – 2019-06-03 (×9): 40 mg via INTRAVENOUS
  Filled 2019-05-30 (×10): qty 40

## 2019-05-30 NOTE — ED Notes (Signed)
Pt requested sleep medication, pt ok with taking benadryl

## 2019-05-30 NOTE — ED Notes (Signed)
Informed patient about delay in getting room in hospital, pt verbalized understanding

## 2019-05-30 NOTE — H&P (Signed)
TRH H&P    Patient Demographics:    Brett Huber, is a 53 y.o. male  MRN: [id number]  DOB - [birth year]  Admit Date - 05/30/2019  Referring MD/NP/PA:  Dolores Frame  Outpatient Primary MD for the patient is Smitty Cords, DO  Patient coming from:  home  Chief complaint-  Abdominal pain   HPI:    Brett Huber  is a 53 y.o. male,  w hypertension, anxiety, recent R shoulder surgery apparently presents with c/o epigastric pain starting yesterday.  Pt denies fever, chills, n/v, diarrhea, brbpr.  Pt states that this pain felt like his prior pancreatitis,  No recent medication changes.  Pt denies etoh use.   In ED,  T 98.3, P 109 R 22, Bp 148/101, pox 97% on RA Wt 79.4kg  CT abd/ pelvis IMPRESSION: 1. Findings consistent with acute pancreatitis involving the pancreatic head and midbody. Extensive inflammatory changes extending into the right pericolic gutter. No pancreatic pseudocyst or areas of definite necrosis. 2. Mild dilatation of the common bile duct measuring up to 1 cm which could be related to postsurgical dilation, no definite choledocholithiasis seen. 3. Mild wall thickening involving the third portion the duodenum, likely related to adjacent duodenitis.  Lipase 145 Na 135, K 3.5,  Bun 12, Creatinine 0.90 Ast 22,Alt 18 Wbc 21.8, Hgb 15.9, Plt 260 Urinalysis prot 30 Rbc 6-10, wbc 0-5  ST at 110, nl axis, no st-t changes c/w ischemia  Pt will be admitted for acute pancreatitis, ? Duodenitis, and microscopic hematuria.            Review of systems:    In addition to the HPI above,  No Fever-chills, No Headache, No changes with Vision or hearing, No problems swallowing food or Liquids, No Chest pain, Cough or Shortness of Breath, No Nausea or Vomiting, bowel movements are regular, No Blood in stool or Urine, No dysuria, No new skin rashes or bruises, No new joints  pains-aches,  No new weakness, tingling, numbness in any extremity, No recent weight gain or loss, No polyuria, polydypsia or polyphagia, No significant Mental Stressors.  All other systems reviewed and are negative.    Past History of the following :    Past Medical History:  Diagnosis Date   Anxiety    Chronic pain    DDD (degenerative disc disease)    Hyperlipidemia    Hypertension    Insomnia    Sinus congestion       Past Surgical History:  Procedure Laterality Date   CERVICAL FUSION  2004   CHOLECYSTECTOMY  2007   neck and disc replacement     SHOULDER ARTHROSCOPY WITH ROTATOR CUFF REPAIR AND SUBACROMIAL DECOMPRESSION Left 01/29/2013   Procedure: LEFT SHOULDER ARTHROSCOPY WITH ROTATOR CUFF REPAIR AND SUBACROMIAL DECOMPRESSION DISTAL CLAVICLE RESECTION;  Surgeon: Mable Paris, MD;  Location: [facility];  Service: Orthopedics;  Laterality: Left;      Social History:      Social History   Tobacco Use   Smoking status: Never Smoker  Smokeless tobacco: Current User    Types: Chew  Substance Use Topics   Alcohol use: Yes    Comment: occ       Family History :     Family History  Problem Relation Age of Onset   Diabetes Mother    Stroke Father    Prostate cancer Neg Hx    Kidney cancer Neg Hx    Bladder Cancer Neg Hx        Home Medications:   Prior to Admission medications   Medication Sig Start Date End Date Taking? Authorizing Provider  ALPRAZolam Prudy Feeler) 1 MG tablet Take 1 tablet (1 mg total) by mouth at bedtime as needed for anxiety or sleep. 04/13/19   Karamalegos, Netta Neat, DO  amLODipine (NORVASC) 10 MG tablet Take 1 tablet (10 mg total) by mouth daily. 10/06/18   Karamalegos, Netta Neat, DO  cyclobenzaprine (FLEXERIL) 10 MG tablet Take 1 tablet (10 mg total) by mouth at bedtime as needed for muscle spasms. 04/05/19   Karamalegos, Netta Neat, DO  naproxen (NAPROSYN) 500 MG tablet Take 1 tablet  (500 mg total) by mouth 2 (two) times daily with a meal. For 1-2 weeks then as needed 04/05/19   Smitty Cords, DO  traZODone (DESYREL) 100 MG tablet TAKE 1 TABLET BY MOUTH AT BEDTIME 04/10/19   Galen Manila, NP  triamcinolone ointment (KENALOG) 0.5 % Apply 1 application topically 2 (two) times daily. 1-2 weeks, for hands. 10/06/18   Smitty Cords, DO     Allergies:     Allergies  Allergen Reactions   Bee Venom Shortness Of Breath     Physical Exam:   Vitals  Blood pressure (!) 145/91, pulse (!) 106, temperature 98.5 F (36.9 C), temperature source Oral, resp. rate 20, height  (1.753 m), weight 79.4 kg, SpO2 97 %.  1.  General: axoxo3  2. Psychiatric: euthymic  3. Neurologic: nonfocal  4. HEENMT:  Anicteric, pupils 1.62mm symmetric, direct, consensual intact Neck: no jvd  5. Respiratory : CTAB  6. Cardiovascular : rrr s1, s2,   7. Gastrointestinal:  Abd: soft, nt, nd, +bs  8. Skin:  Ext: no c/c/e, no rash, negative cullens sign  9.Musculoskeletal:  Good ROM    Data Review:    CBC Recent Labs  Lab 05/29/19 2134  WBC 21.8*  HGB 15.9  HCT 43.2  PLT 260  MCV 83.2  MCH 30.6  MCHC 36.8*  RDW 12.8   ------------------------------------------------------------------------------------------------------------------  Results for orders placed or performed during the hospital encounter of 05/30/19 (from the past 48 hour(s))  Lipase, blood     Status: Abnormal   Collection Time: 05/29/19  9:34 PM  Result Value Ref Range   Lipase 145 (H) 11 - 51 U/L    Comment: Performed at [facility], [address] [place], [place] [address]  Comprehensive metabolic panel     Status: Abnormal   Collection Time: 05/29/19  9:34 PM  Result Value Ref Range   Sodium 135 135 - 145 mmol/L   Potassium 3.5 3.5 - 5.1 mmol/L   Chloride 98 98 - 111 mmol/L   CO2 24 22 - 32 mmol/L   Glucose, Bld 144 (H) 70 - 99 mg/dL   BUN 12 6 - 20  mg/dL   Creatinine, Ser 1.19 0.61 - 1.24 mg/dL   Calcium 9.1 8.9 - 14.7 mg/dL   Total Protein 7.6 6.5 - 8.1 g/dL   Albumin 4.4 3.5 - 5.0 g/dL  AST 22 15 - 41 U/L   ALT 18 0 - 44 U/L   Alkaline Phosphatase 43 38 - 126 U/L   Total Bilirubin 1.7 (H) 0.3 - 1.2 mg/dL   GFR calc non Af Amer >60 >60 mL/min   GFR calc Af Amer >60 >60 mL/min   Anion gap 13 5 - 15    Comment: Performed at [facility], [address] [place], [place] [address]  CBC     Status: Abnormal   Collection Time: 05/29/19  9:34 PM  Result Value Ref Range   WBC 21.8 (H) 4.0 - 10.5 K/uL   RBC 5.19 4.22 - 5.81 MIL/uL   Hemoglobin 15.9 13.0 - 17.0 g/dL   HCT 43.2 39.0 - 52.0 %   MCV 83.2 80.0 - 100.0 fL   MCH 30.6 26.0 - 34.0 pg   MCHC 36.8 (H) 30.0 - 36.0 g/dL   RDW 12.8 11.5 - 15.5 %   Platelets 260 150 - 400 K/uL   nRBC 0.0 0.0 - 0.2 %    Comment: Performed at [facility], [address] [place], [place] [address]  Urinalysis, Complete w Microscopic     Status: Abnormal   Collection Time: 05/29/19  9:35 PM  Result Value Ref Range   Color, Urine YELLOW (A) YELLOW   APPearance HAZY (A) CLEAR   Specific Gravity, Urine 1.017 1.005 - 1.030   pH 7.0 5.0 - 8.0   Glucose, UA NEGATIVE NEGATIVE mg/dL   Hgb urine dipstick SMALL (A) NEGATIVE   Bilirubin Urine NEGATIVE NEGATIVE   Ketones, ur NEGATIVE NEGATIVE mg/dL   Protein, ur 30 (A) NEGATIVE mg/dL   Nitrite NEGATIVE NEGATIVE   Leukocytes,Ua NEGATIVE NEGATIVE   RBC / HPF 6-10 0 - 5 RBC/hpf   WBC, UA 0-5 0 - 5 WBC/hpf   Bacteria, UA RARE (A) NONE SEEN   Squamous Epithelial / LPF 0-5 0 - 5   Mucus PRESENT    Amorphous Crystal PRESENT     Comment: Performed at [facility], [address] [place], [place] [address]    Chemistries  Recent Labs  Lab 05/29/19 2134  NA 135  K 3.5  CL 98  CO2 24  GLUCOSE 144*  BUN 12  CREATININE 0.90  CALCIUM 9.1  AST 22  ALT 18  ALKPHOS 43  BILITOT 1.7*    ------------------------------------------------------------------------------------------------------------------  ------------------------------------------------------------------------------------------------------------------ GFR: Estimated Creatinine Clearance: 93.8 mL/min (by C-G formula based on SCr of 0.9 mg/dL). Liver Function Tests: Recent Labs  Lab 05/29/19 2134  AST 22  ALT 18  ALKPHOS 43  BILITOT 1.7*  PROT 7.6  ALBUMIN 4.4   Recent Labs  Lab 05/29/19 2134  LIPASE 145*   No results for input(s): AMMONIA in the last 168 hours. Coagulation Profile: No results for input(s): INR, PROTIME in the last 168 hours. Cardiac Enzymes: No results for input(s): CKTOTAL, CKMB, CKMBINDEX, TROPONINI in the last 168 hours. BNP (last 3 results) No results for input(s): PROBNP in the last 8760 hours. HbA1C: No results for input(s): HGBA1C in the last 72 hours. CBG: No results for input(s): GLUCAP in the last 168 hours. Lipid Profile: No results for input(s): CHOL, HDL, LDLCALC, TRIG, CHOLHDL, LDLDIRECT in the last 72 hours. Thyroid Function Tests: No results for input(s): TSH, T4TOTAL, FREET4, T3FREE, THYROIDAB in the last 72 hours. Anemia Panel: No results for input(s): VITAMINB12, FOLATE, FERRITIN, TIBC, IRON, RETICCTPCT in the last 72 hours.  --------------------------------------------------------------------------------------------------------------- Urine analysis:    Component Value  Date/Time   COLORURINE YELLOW (A) 05/29/2019 2135   APPEARANCEUR HAZY (A) 05/29/2019 2135   APPEARANCEUR Clear 09/22/2016 0818   LABSPEC 1.017 05/29/2019 2135   PHURINE 7.0 05/29/2019 2135   GLUCOSEU NEGATIVE 05/29/2019 2135   HGBUR SMALL (A) 05/29/2019 2135   BILIRUBINUR NEGATIVE 05/29/2019 2135   BILIRUBINUR Negative 09/22/2016 0818   KETONESUR NEGATIVE 05/29/2019 2135   PROTEINUR 30 (A) 05/29/2019 2135   NITRITE NEGATIVE 05/29/2019 2135   LEUKOCYTESUR NEGATIVE 05/29/2019 2135       Imaging Results:    Ct Abdomen Pelvis W Contrast  Result Date: 05/30/2019 CLINICAL DATA:  Abdominal pain elevated lipase EXAM: CT ABDOMEN AND PELVIS WITH CONTRAST TECHNIQUE: Multidetector CT imaging of the abdomen and pelvis was performed using the standard protocol following bolus administration of intravenous contrast. CONTRAST:  100mL OMNIPAQUE IOHEXOL 300 MG/ML  SOLN COMPARISON:  None. FINDINGS: Lower chest: The visualized heart size within normal limits. No pericardial fluid/thickening. No hiatal hernia. Streaky atelectasis seen at the right lung base. Hepatobiliary: The liver is normal in density without focal abnormality.The main portal vein is patent. The patient is status post cholecystectomy. There is mild intrahepatic biliary ductal dilatation. The common bile duct appears to be mildly dilated measuring up to 9 mm. No definite choledocholithiasis is seen. Pancreas: Significant fat stranding changes are seen around the pancreatic head and proximal body. There is mild fatty atrophy seen throughout the pancreas. The mesenteric inflammatory changes extends through the right pericolic gutter. There is normal pancreatic enhancement. No peripancreatic fluid collections or areas of necrosis are seen. Spleen: Normal in size without focal abnormality. Adrenals/Urinary Tract: Both adrenal glands appear normal. The kidneys and collecting system appear normal without evidence of urinary tract calculus or hydronephrosis. Bladder is unremarkable. Stomach/Bowel: The stomach is normal in appearance. There appears to be mild wall thickening involving the third portion of the duodenum, with surrounding fat stranding changes from the pancreas.The appendix is normal. Vascular/Lymphatic: There are no enlarged mesenteric, retroperitoneal, or pelvic lymph nodes. No significant vascular findings are present. Reproductive: The prostate is unremarkable. Other: No evidence of abdominal wall mass. Small fat containing  inguinal hernias are noted. Musculoskeletal: No acute or significant osseous findings. IMPRESSION: 1. Findings consistent with acute pancreatitis involving the pancreatic head and midbody. Extensive inflammatory changes extending into the right pericolic gutter. No pancreatic pseudocyst or areas of definite necrosis. 2. Mild dilatation of the common bile duct measuring up to 1 cm which could be related to postsurgical dilation, no definite choledocholithiasis seen. 3. Mild wall thickening involving the third portion the duodenum, likely related to adjacent duodenitis. Electronically Signed   By: Jonna ClarkBindu  Avutu M.D.   On: 05/30/2019 00:59       Assessment & Plan:    Principal Problem:   Pancreatitis Active Problems:   Benign hypertension with CKD (chronic kidney disease), stage II   Hyperlipidemia  Acute Pancreatitis NPO  Ns iv Dilaudid 1mg  iv q3h prn  Zofran 4mg  iv q6h prn  Benadryl 25mg  iv q6h prn  MRCP Check cbc, cmp, lipid, Ldh, lipase in am  Leukocytosis Check cbc in am  Hypertension Cont Amlodipine 10mg  po qday  Anxiety Cont Xanax  Insomnia Cont Trazodone 100mg  po qhs prn   DVT Prophylaxis-   Lovenox - SCDs   AM Labs Ordered, also please review Full Orders  Family Communication: Admission, patients condition and plan of care including tests being ordered have been discussed with the patient  who indicate understanding and agree with the plan  and Code Status.  Code Status:  FULL CODE per patient  Admission status: Inpatient: Based on patients clinical presentation and evaluation of above clinical data, I have made determination that patient meets Inpatient criteria at this time. Pt will require iv fluids and iv pain medications for pancreatitis. Pt has high risk of clinical deterioration. Pt will require > 2 nites stay.   Time spent in minutes : 55 minutes   Pearson Grippe M.D on 05/30/2019 at 3:14 AM

## 2019-05-30 NOTE — ED Notes (Signed)
Pt stood beside bed and used urinal. No distress noted.

## 2019-05-30 NOTE — ED Provider Notes (Signed)
[facility] Emergency Department Provider Note   ____________________________________________   First MD Initiated Contact with Patient 05/30/19 0211     (approximate)  I have reviewed the triage vital signs and the nursing notes.   HISTORY  Chief Complaint Abdominal Pain    HPI Brett Huber is a 53 y.o. male who presents to the ED from home with a chief complaint of abdominal pain.  Patient had right shoulder surgery yesterday and noted epigastric pain after his pain medicine wore off.  History of pancreatitis due to gallstones and this feels similarly.  Symptoms associated with nausea and vomiting.  Denies fever, cough, chest pain, shortness of breath, diarrhea.       Past Medical History:  Diagnosis Date  . Anxiety   . Chronic pain   . DDD (degenerative disc disease)   . Hyperlipidemia   . Hypertension   . Insomnia   . Sinus congestion     Patient Active Problem List   Diagnosis Date Noted  . Irritant hand dermatitis 03/08/2018  . Bladder outlet obstruction 08/30/2016  . BPH (benign prostatic hyperplasia) 08/30/2016  . Hematuria 08/30/2016  . Proteinuria 08/30/2016  . Leukocytosis 08/30/2016  . Hearing loss of left ear 07/19/2016  . CKD (chronic kidney disease), stage II 07/02/2016  . Colon cancer screening 06/04/2016  . Abnormal glucose 06/04/2016  . Benign hypertension with CKD (chronic kidney disease), stage II 07/23/2015  . Hyperlipidemia 07/23/2015  . Anxiety and depression 07/23/2015  . Chronic pain 07/23/2015  . Insomnia 07/23/2015  . Erectile dysfunction 07/23/2015  . Vitamin D deficiency 07/23/2015    Past Surgical History:  Procedure Laterality Date  . CERVICAL FUSION  2004  . CHOLECYSTECTOMY  2007  . neck and disc replacement    . SHOULDER ARTHROSCOPY WITH ROTATOR CUFF REPAIR AND SUBACROMIAL DECOMPRESSION Left 01/29/2013   Procedure: LEFT SHOULDER ARTHROSCOPY WITH ROTATOR CUFF REPAIR AND SUBACROMIAL  DECOMPRESSION DISTAL CLAVICLE RESECTION;  Surgeon: Mable Paris, MD;  Location: [facility];  Service: Orthopedics;  Laterality: Left;    Prior to Admission medications   Medication Sig Start Date End Date Taking? Authorizing Provider  ALPRAZolam Prudy Feeler) 1 MG tablet Take 1 tablet (1 mg total) by mouth at bedtime as needed for anxiety or sleep. 04/13/19   Karamalegos, Netta Neat, DO  amLODipine (NORVASC) 10 MG tablet Take 1 tablet (10 mg total) by mouth daily. 10/06/18   Karamalegos, Netta Neat, DO  cyclobenzaprine (FLEXERIL) 10 MG tablet Take 1 tablet (10 mg total) by mouth at bedtime as needed for muscle spasms. 04/05/19   Karamalegos, Netta Neat, DO  naproxen (NAPROSYN) 500 MG tablet Take 1 tablet (500 mg total) by mouth 2 (two) times daily with a meal. For 1-2 weeks then as needed 04/05/19   Smitty Cords, DO  traZODone (DESYREL) 100 MG tablet TAKE 1 TABLET BY MOUTH AT BEDTIME 04/10/19   Galen Manila, NP  triamcinolone ointment (KENALOG) 0.5 % Apply 1 application topically 2 (two) times daily. 1-2 weeks, for hands. 10/06/18   Karamalegos, Netta Neat, DO    Allergies Bee venom  Family History  Problem Relation Age of Onset  . Diabetes Mother   . Stroke Father   . Prostate cancer Neg Hx   . Kidney cancer Neg Hx   . Bladder Cancer Neg Hx     Social History Social History   Tobacco Use  . Smoking status: Never Smoker  . Smokeless tobacco: Current User  Types: Chew  Substance Use Topics  . Alcohol use: Yes    Comment: occ  . Drug use: No    Review of Systems  Constitutional: No fever/chills Eyes: No visual changes. ENT: No sore throat. Cardiovascular: Denies chest pain. Respiratory: Denies shortness of breath. Gastrointestinal: Positive for abdominal pain, nausea and vomiting.  No diarrhea.  No constipation. Genitourinary: Negative for dysuria. Musculoskeletal: Negative for back pain. Skin: Negative for rash. Neurological:  Negative for headaches, focal weakness or numbness.   ____________________________________________   PHYSICAL EXAM:  VITAL SIGNS: ED Triage Vitals  Enc Vitals Group     BP 05/29/19 2131 (!) 148/101     Pulse Rate 05/29/19 2131 (!) 109     Resp 05/29/19 2131 (!) 22     Temp 05/29/19 2131 98.3 F (36.8 C)     Temp Source 05/29/19 2131 Oral     SpO2 05/29/19 2131 97 %     Weight 05/29/19 2127 175 lb (79.4 kg)     Height 05/29/19 2127 5\' 9"  (1.753 m)     Head Circumference --      Peak Flow --      Pain Score 05/29/19 2126 10     Pain Loc --      Pain Edu? --      Excl. in GC? --     Constitutional: Alert and oriented.  Uncomfortable appearing and in mild acute distress. Eyes: Conjunctivae are normal. PERRL. EOMI. Head: Atraumatic. Nose: No congestion/rhinnorhea. Mouth/Throat: Mucous membranes are moist.  Oropharynx non-erythematous. Neck: No stridor.   Cardiovascular: Normal rate, regular rhythm. Grossly normal heart sounds.  Good peripheral circulation. Respiratory: Normal respiratory effort.  No retractions. Lungs CTAB. Gastrointestinal: Soft and moderately tender to palpation epigastrium without rebound or guarding. No distention. No abdominal bruits. No CVA tenderness. Musculoskeletal: No lower extremity tenderness nor edema.  No joint effusions. Neurologic:  Normal speech and language. No gross focal neurologic deficits are appreciated. No gait instability. Skin:  Skin is warm, dry and intact. No rash noted. Psychiatric: Mood and affect are normal. Speech and behavior are normal.  ____________________________________________   LABS (all labs ordered are listed, but only abnormal results are displayed)  Labs Reviewed  LIPASE, BLOOD - Abnormal; Notable for the following components:      Result Value   Lipase 145 (*)    All other components within normal limits  COMPREHENSIVE METABOLIC PANEL - Abnormal; Notable for the following components:   Glucose, Bld 144 (*)     Total Bilirubin 1.7 (*)    All other components within normal limits  CBC - Abnormal; Notable for the following components:   WBC 21.8 (*)    MCHC 36.8 (*)    All other components within normal limits  URINALYSIS, COMPLETE (UACMP) WITH MICROSCOPIC - Abnormal; Notable for the following components:   Color, Urine YELLOW (*)    APPearance HAZY (*)    Hgb urine dipstick SMALL (*)    Protein, ur 30 (*)    Bacteria, UA RARE (*)    All other components within normal limits  SARS CORONAVIRUS 2 (TAT 6-24 HRS)   ____________________________________________  EKG  ED ECG REPORT I, , J, the attending physician, personally viewed and interpreted this ECG.   Date: 05/30/2019  EKG Time: 2130  Rate: 110  Rhythm: sinus tachycardia  Axis: Normal  Intervals:none  ST&T Change: Nonspecific  ____________________________________________  RADIOLOGY  ED MD interpretation: Acute pancreatitis with extensive inflammatory changes  Official radiology report(s): Ct  Abdomen Pelvis W Contrast  Result Date: 05/30/2019 CLINICAL DATA:  Abdominal pain elevated lipase EXAM: CT ABDOMEN AND PELVIS WITH CONTRAST TECHNIQUE: Multidetector CT imaging of the abdomen and pelvis was performed using the standard protocol following bolus administration of intravenous contrast. CONTRAST:  OMNIPAQUE IOHEXOL 300 MG/ML  SOLN COMPARISON:  None. FINDINGS: Lower chest: The visualized heart size within normal limits. No pericardial fluid/thickening. No hiatal hernia. Streaky atelectasis seen at the right lung base. Hepatobiliary: The liver is normal in density without focal abnormality.The main portal vein is patent. The patient is status post cholecystectomy. There is mild intrahepatic biliary ductal dilatation. The common bile duct appears to be mildly dilated measuring up to 9 mm. No definite choledocholithiasis is seen. Pancreas: Significant fat stranding changes are seen around the pancreatic head and proximal  body. There is mild fatty atrophy seen throughout the pancreas. The mesenteric inflammatory changes extends through the right pericolic gutter. There is normal pancreatic enhancement. No peripancreatic fluid collections or areas of necrosis are seen. Spleen: Normal in size without focal abnormality. Adrenals/Urinary Tract: Both adrenal glands appear normal. The kidneys and collecting system appear normal without evidence of urinary tract calculus or hydronephrosis. Bladder is unremarkable. Stomach/Bowel: The stomach is normal in appearance. There appears to be mild wall thickening involving the third portion of the duodenum, with surrounding fat stranding changes from the pancreas.The appendix is normal. Vascular/Lymphatic: There are no enlarged mesenteric, retroperitoneal, or pelvic lymph nodes. No significant vascular findings are present. Reproductive: The prostate is unremarkable. Other: No evidence of abdominal wall mass. Small fat containing inguinal hernias are noted. Musculoskeletal: No acute or significant osseous findings. IMPRESSION: 1. Findings consistent with acute pancreatitis involving the pancreatic head and midbody. Extensive inflammatory changes extending into the right pericolic gutter. No pancreatic pseudocyst or areas of definite necrosis. 2. Mild dilatation of the common bile duct measuring up to 1 cm which could be related to postsurgical dilation, no definite choledocholithiasis seen. 3. Mild wall thickening involving the third portion the duodenum, likely related to adjacent duodenitis. Electronically Signed   By: Jonna Clark M.D.   On: 05/30/2019 00:59    ____________________________________________   PROCEDURES  Procedure(s) performed (including Critical Care):  Procedures   ____________________________________________   INITIAL IMPRESSION / ASSESSMENT AND PLAN / ED COURSE  As part of my medical decision making, I reviewed the following data within the electronic medical  record:  Nursing notes reviewed and incorporated, Labs reviewed, EKG interpreted, Old chart reviewed, Radiograph reviewed, Discussed with admitting physician Dr. Selena Batten and Notes from prior ED visits     Jettie Pagan was evaluated in Emergency Department on 05/30/2019 for the symptoms described in the history of present illness. He was evaluated in the context of the global COVID-19 pandemic, which necessitated consideration that the patient might be at risk for infection with the SARS-CoV-2 virus that causes COVID-19. Institutional protocols and algorithms that pertain to the evaluation of patients at risk for COVID-19 are in a state of rapid change based on information released by regulatory bodies including the CDC and federal and state organizations. These policies and algorithms were followed during the patient's care in the ED.    53 year old male who presents with epigastric abdominal pain, nausea and vomiting. Differential diagnosis includes, but is not limited to, biliary disease (biliary colic, acute cholecystitis, cholangitis, choledocholithiasis, etc), intrathoracic causes for epigastric abdominal pain including ACS, gastritis, duodenitis, pancreatitis, small bowel or large bowel obstruction, abdominal aortic aneurysm, hernia, and ulcer(s).  Labs  notable for leukocytosis and elevated lipase.  CT demonstrates acute pancreatitis with extensive inflammatory changes.  Patient received 1 L IV fluids and Dilaudid prior to being roomed.  Rates pain still 8/10.  Will add additional IV fluids, IV Morphine and Zofran.  Will discuss with hospitalist to evaluate patient in the emergency department for admission.      ____________________________________________   FINAL CLINICAL IMPRESSION(S) / ED DIAGNOSES  Final diagnoses:  Epigastric pain  Non-intractable vomiting with nausea, unspecified vomiting type  Acute pancreatitis, unspecified complication status, unspecified pancreatitis type      ED Discharge Orders    None       Note:  This document was prepared using Dragon voice recognition software and may include unintentional dictation errors.   Paulette Blanch, MD 05/30/19 [phone]

## 2019-05-30 NOTE — ED Notes (Signed)
ED Provider at bedside. 

## 2019-05-30 NOTE — ED Notes (Signed)
MD alam messaged to inform pt requesting more pain medication. Reports pain 8/10.  Unlabored. Alert and oriented. NAD. VSS

## 2019-05-30 NOTE — Progress Notes (Signed)
HPI on admission: Karon Heckendorn  is a 53 y.o. male,  w hypertension, anxiety, recent R shoulder surgery apparently presents with c/o epigastric pain starting yesterday.  Pt denies fever, chills, n/v, diarrhea, brbpr.  Pt states that this pain felt like his prior pancreatitis,  No recent medication changes.  Pt denies etoh use.   CT abd/ pelvis IMPRESSION: 1. Findings consistent with acute pancreatitis involving the pancreatic head and midbody. Extensive inflammatory changes extending into the right pericolic gutter. No pancreatic pseudocyst or areas of definite necrosis. 2. Mild dilatation of the common bile duct measuring up to 1 cm which could be related to postsurgical dilation, no definite choledocholithiasis seen. 3. Mild wall thickening involving the third portion the duodenum, likely related to adjacent duodenitis.  A/P: Chart reviewed. Cont to follow Plan from admission.  Pain meds frequency changed patient was reporting more intense abdominal pain.  Advised nurse to maintain caution. MRCP report:IMPRESSION: 1. MR findings consistent with acute pancreatitis but no evidence of pancreatic necrosis or other complicating features. 2. Status post cholecystectomy with mild intrahepatic and moderate extrahepatic biliary dilatation, likely physiologic. No common bile duct stones, pancreatic head mass or ampullary lesion.   Principal Problem:   Pancreatitis Active Problems:   Benign hypertension with CKD (chronic kidney disease), stage II   Hyperlipidemia  Acute Pancreatitis NPO  Ns iv Dilaudid 1mg  iv q3h prn  Zofran 4mg  iv q6h prn  Benadryl 25mg  iv q6h prn  MRCP Check cbc, cmp, lipid, Ldh, lipase in am  Leukocytosis Check cbc in am  Hypertension Cont Amlodipine 10mg  po qday  Anxiety Cont Xanax  Insomnia Cont Trazodone 100mg  po qhs prn   DVT Prophylaxis-   Lovenox - SCDs

## 2019-05-31 ENCOUNTER — Other Ambulatory Visit: Payer: Self-pay

## 2019-05-31 DIAGNOSIS — K859 Acute pancreatitis without necrosis or infection, unspecified: Principal | ICD-10-CM

## 2019-05-31 DIAGNOSIS — K852 Alcohol induced acute pancreatitis without necrosis or infection: Secondary | ICD-10-CM

## 2019-05-31 LAB — COMPREHENSIVE METABOLIC PANEL
ALT: 24 U/L (ref 0–44)
AST: 25 U/L (ref 15–41)
Albumin: 3.8 g/dL (ref 3.5–5.0)
Alkaline Phosphatase: 54 U/L (ref 38–126)
Anion gap: 11 (ref 5–15)
BUN: 10 mg/dL (ref 6–20)
CO2: 23 mmol/L (ref 22–32)
Calcium: 9 mg/dL (ref 8.9–10.3)
Chloride: 102 mmol/L (ref 98–111)
Creatinine, Ser: 0.8 mg/dL (ref 0.61–1.24)
GFR calc Af Amer: >60 mL/min (ref >60)
GFR calc non Af Amer: >60 mL/min (ref >60)
Glucose, Bld: 121 mg/dL — ABNORMAL HIGH (ref 70–99)
Potassium: 3.6 mmol/L (ref 3.5–5.1)
Sodium: 136 mmol/L (ref 135–145)
Total Bilirubin: 2.6 mg/dL — ABNORMAL HIGH (ref 0.3–1.2)
Total Protein: 7.2 g/dL (ref 6.5–8.1)

## 2019-05-31 LAB — CBC WITH DIFFERENTIAL/PLATELET
Abs Immature Granulocytes: 0.23 10*3/uL — ABNORMAL HIGH (ref 0.00–0.07)
Basophils Absolute: 0 10*3/uL (ref 0.0–0.1)
Basophils Relative: 0 %
Eosinophils Absolute: 0.1 10*3/uL (ref 0.0–0.5)
Eosinophils Relative: 0 %
HCT: 37.9 % — ABNORMAL LOW (ref 39.0–52.0)
Hemoglobin: 13.9 g/dL (ref 13.0–17.0)
Immature Granulocytes: 1 %
Lymphocytes Relative: 2 %
Lymphs Abs: 0.5 10*3/uL — ABNORMAL LOW (ref 0.7–4.0)
MCH: 30.3 pg (ref 26.0–34.0)
MCHC: 36.7 g/dL — ABNORMAL HIGH (ref 30.0–36.0)
MCV: 82.8 fL (ref 80.0–100.0)
Monocytes Absolute: 2.1 10*3/uL — ABNORMAL HIGH (ref 0.1–1.0)
Monocytes Relative: 10 %
Neutro Abs: 18.9 10*3/uL — ABNORMAL HIGH (ref 1.7–7.7)
Neutrophils Relative %: 87 %
Platelets: 204 10*3/uL (ref 150–400)
RBC: 4.58 MIL/uL (ref 4.22–5.81)
RDW: 13.2 % (ref 11.5–15.5)
WBC: 21.8 10*3/uL — ABNORMAL HIGH (ref 4.0–10.5)
nRBC: 0 % (ref 0.0–0.2)

## 2019-05-31 LAB — LIPASE, BLOOD: Lipase: 39 U/L (ref 11–51)

## 2019-05-31 LAB — BILIRUBIN, DIRECT: Bilirubin, Direct: 0.7 mg/dL — ABNORMAL HIGH (ref 0.0–0.2)

## 2019-05-31 MED ORDER — OXYCODONE HCL ER 10 MG PO T12A
10.0000 mg | EXTENDED_RELEASE_TABLET | Freq: Two times a day (BID) | ORAL | Status: DC
  Administered 2019-05-31 – 2019-06-03 (×7): 10 mg via ORAL
  Filled 2019-05-31 (×7): qty 1

## 2019-05-31 MED ORDER — SENNA 8.6 MG PO TABS
1.0000 | ORAL_TABLET | Freq: Every day | ORAL | Status: DC | PRN
  Administered 2019-05-31 – 2019-06-01 (×2): 8.6 mg via ORAL
  Filled 2019-05-31 (×2): qty 1

## 2019-05-31 MED ORDER — HYDROMORPHONE HCL 1 MG/ML IJ SOLN
1.0000 mg | INTRAMUSCULAR | Status: DC | PRN
  Administered 2019-05-31 – 2019-06-03 (×12): 1 mg via INTRAVENOUS
  Filled 2019-05-31 (×12): qty 1

## 2019-05-31 MED ORDER — PIPERACILLIN-TAZOBACTAM 3.375 G IVPB
3.3750 g | Freq: Three times a day (TID) | INTRAVENOUS | Status: DC
  Administered 2019-05-31 – 2019-06-03 (×10): 3.375 g via INTRAVENOUS
  Filled 2019-05-31 (×10): qty 50

## 2019-05-31 MED ORDER — SODIUM CHLORIDE 0.9 % IV SOLN
INTRAVENOUS | Status: AC
  Administered 2019-05-31 – 2019-06-01 (×3): via INTRAVENOUS

## 2019-05-31 MED ORDER — ACETAMINOPHEN 325 MG PO TABS
650.0000 mg | ORAL_TABLET | Freq: Four times a day (QID) | ORAL | Status: DC | PRN
  Administered 2019-05-31 – 2019-06-03 (×5): 650 mg via ORAL
  Filled 2019-05-31 (×5): qty 2

## 2019-05-31 MED ORDER — METOPROLOL TARTRATE 25 MG PO TABS
25.0000 mg | ORAL_TABLET | Freq: Two times a day (BID) | ORAL | Status: DC
  Administered 2019-05-31 – 2019-06-03 (×7): 25 mg via ORAL
  Filled 2019-05-31 (×7): qty 1

## 2019-05-31 NOTE — Progress Notes (Signed)
Brief History:  Brett Huber a54 y.o.male,w hypertension, anxiety, recent R shoulder surgery apparently presents with c/o epigastric pain starting yesterday. Pt denies fever, chills, n/v, diarrhea, brbpr. Pt states that this pain felt like his prior pancreatitis, No recent medication changes. Admits Ethyl alcohol use.  Subjective: still having abdominal pain every couple of hours.  In the morning he was sitting on the chair resting if it was due to alcohol ingestion that he does at home frequently.  States he has had gallstone pancreatitis in the past and he is a status post cholecystectomy for many years now.  Patient denies any fever or chills.  Has not had a bowel movement for 3 4 days now.  Also says he has not been eating much for the past for 5 days.  Objective: Vital signs in last 24 hours: Temp:  [98.6 F (37 C)-99.3 F (37.4 C)] 98.6 F (37 C) (12/03 0414) Pulse Rate:  [77-122] 99 (12/03 0414) Resp:  [16-20] 18 (12/03 0414) BP: (146-166)/(94-104) 155/96 (12/03 0414) SpO2:  [92 %-96 %] 96 % (12/03 0414)  Intake/Output from previous day: 12/02 0701 - 12/03 0700 In: 2174.7 [I.V.:2174.7] Out: 750 [Urine:750] Intake/Output this shift: Total I/O In: 259.5 [I.V.:259.5] Out: -   1.  General: axoxo3  2. Psychiatric: euthymic  3. Neurologic: nonfocal  4. HEENMT:  Anicteric, pupils 1.86mm symmetric, direct, consensual intact Neck: no jvd  5. Respiratory : CTAB  6. Cardiovascular : rrr s1, s2,   7. Gastrointestinal:  Abd: soft,  nd, +bs; Tender to palpation at the epigastric region.  No guarding.  And no rebound tenderness.  No change in skin color on the front or back on the abdomen or lower back.  8. Skin:  Ext: no c/c/e, no rash, negative cullens sign  9.Musculoskeletal:  Good ROM  Results for orders placed or performed during the hospital encounter of 05/30/19 (from the past 24 hour(s))  CBC with Differential/Platelet     Status:  Abnormal   Collection Time: 05/31/19  7:47 AM  Result Value Ref Range   WBC 21.8 (H) 4.0 - 10.5 K/uL   RBC 4.58 4.22 - 5.81 MIL/uL   Hemoglobin 13.9 13.0 - 17.0 g/dL   HCT 37.9 (L) 39.0 - 52.0 %   MCV 82.8 80.0 - 100.0 fL   MCH 30.3 26.0 - 34.0 pg   MCHC 36.7 (H) 30.0 - 36.0 g/dL   RDW 13.2 11.5 - 15.5 %   Platelets 204 150 - 400 K/uL   nRBC 0.0 0.0 - 0.2 %   Neutrophils Relative % 87 %   Neutro Abs 18.9 (H) 1.7 - 7.7 K/uL   Lymphocytes Relative 2 %   Lymphs Abs 0.5 (L) 0.7 - 4.0 K/uL   Monocytes Relative 10 %   Monocytes Absolute 2.1 (H) 0.1 - 1.0 K/uL   Eosinophils Relative 0 %   Eosinophils Absolute 0.1 0.0 - 0.5 K/uL   Basophils Relative 0 %   Basophils Absolute 0.0 0.0 - 0.1 K/uL   Immature Granulocytes 1 %   Abs Immature Granulocytes 0.23 (H) 0.00 - 0.07 K/uL  Lipase, blood     Status: None   Collection Time: 05/31/19  7:47 AM  Result Value Ref Range   Lipase 39 11 - 51 U/L  Comprehensive metabolic panel     Status: Abnormal   Collection Time: 05/31/19  7:47 AM  Result Value Ref Range   Sodium 136 135 - 145 mmol/L   Potassium 3.6 3.5 - 5.1 mmol/L  Chloride 102 98 - 111 mmol/L   CO2 23 22 - 32 mmol/L   Glucose, Bld 121 (H) 70 - 99 mg/dL   BUN 10 6 - 20 mg/dL   Creatinine, Ser 2.130.80 0.61 - 1.24 mg/dL   Calcium 9.0 8.9 - 08.610.3 mg/dL   Total Protein 7.2 6.5 - 8.1 g/dL   Albumin 3.8 3.5 - 5.0 g/dL   AST 25 15 - 41 U/L   ALT 24 0 - 44 U/L   Alkaline Phosphatase 54 38 - 126 U/L   Total Bilirubin 2.6 (H) 0.3 - 1.2 mg/dL   GFR calc non Af Amer >60 >60 mL/min   GFR calc Af Amer >60 >60 mL/min   Anion gap 11 5 - 15    Studies/Results: Ct Abdomen Pelvis W Contrast  Result Date: 05/30/2019 CLINICAL DATA:  Abdominal pain elevated lipase EXAM: CT ABDOMEN AND PELVIS WITH CONTRAST TECHNIQUE: Multidetector CT imaging of the abdomen and pelvis was performed using the standard protocol following bolus administration of intravenous contrast. CONTRAST:  100mL OMNIPAQUE IOHEXOL  300 MG/ML  SOLN COMPARISON:  None. FINDINGS: Lower chest: The visualized heart size within normal limits. No pericardial fluid/thickening. No hiatal hernia. Streaky atelectasis seen at the right lung base. Hepatobiliary: The liver is normal in density without focal abnormality.The main portal vein is patent. The patient is status post cholecystectomy. There is mild intrahepatic biliary ductal dilatation. The common bile duct appears to be mildly dilated measuring up to 9 mm. No definite choledocholithiasis is seen. Pancreas: Significant fat stranding changes are seen around the pancreatic head and proximal body. There is mild fatty atrophy seen throughout the pancreas. The mesenteric inflammatory changes extends through the right pericolic gutter. There is normal pancreatic enhancement. No peripancreatic fluid collections or areas of necrosis are seen. Spleen: Normal in size without focal abnormality. Adrenals/Urinary Tract: Both adrenal glands appear normal. The kidneys and collecting system appear normal without evidence of urinary tract calculus or hydronephrosis. Bladder is unremarkable. Stomach/Bowel: The stomach is normal in appearance. There appears to be mild wall thickening involving the third portion of the duodenum, with surrounding fat stranding changes from the pancreas.The appendix is normal. Vascular/Lymphatic: There are no enlarged mesenteric, retroperitoneal, or pelvic lymph nodes. No significant vascular findings are present. Reproductive: The prostate is unremarkable. Other: No evidence of abdominal wall mass. Small fat containing inguinal hernias are noted. Musculoskeletal: No acute or significant osseous findings. IMPRESSION: 1. Findings consistent with acute pancreatitis involving the pancreatic head and midbody. Extensive inflammatory changes extending into the right pericolic gutter. No pancreatic pseudocyst or areas of definite necrosis. 2. Mild dilatation of the common bile duct measuring  up to 1 cm which could be related to postsurgical dilation, no definite choledocholithiasis seen. 3. Mild wall thickening involving the third portion the duodenum, likely related to adjacent duodenitis. Electronically Signed   By: Jonna ClarkBindu  Avutu M.D.   On: 05/30/2019 00:59   Mr 3d Recon At Scanner  Result Date: 05/30/2019 CLINICAL DATA:  Acute pancreatitis. EXAM: MRI ABDOMEN WITHOUT AND WITH CONTRAST (INCLUDING MRCP) TECHNIQUE: Multiplanar multisequence MR imaging of the abdomen was performed both before and after the administration of intravenous contrast. Heavily T2-weighted images of the biliary and pancreatic ducts were obtained, and three-dimensional MRCP images were rendered by post processing. CONTRAST:  7.245mL GADAVIST GADOBUTROL 1 MMOL/ML IV SOLN COMPARISON:  CT scan 05/30/2019 FINDINGS: Lower chest: The lung bases are grossly clear. Minimal streaky basilar atelectasis but no pleural effusions. No pericardial effusion. Hepatobiliary: No focal  hepatic lesions. There is mild intrahepatic biliary dilatation and moderate extrahepatic biliary dilatation. The common bile duct measures a maximum of 11.5 mm in the porta hepatis and 8.5 mm in the head of the pancreas. Cystic duct remnant noted. The gallbladder is surgically absent. No common bile duct stones are identified. No pancreatic head mass or ampullary lesion. Pancreas: As demonstrated on the CT scan there is diffuse inflammation in and around the pancreas consistent with acute pancreatitis. There is some fluid in the anterior pararenal spaces and in the right pericolic gutter. There is also scattered mesenteric edema and fluid. The pancreas demonstrates relatively normal enhancement pattern without findings suspicious for pancreatic necrosis. Normal caliber and course of the main pancreatic duct with very slight dilatation distally near the ampulla where it measures 3.2 mm. Spleen:  Normal size.  No focal lesions. Adrenals/Urinary Tract: The adrenal glands  and kidneys are unremarkable. Stomach/Bowel: The stomach, duodenum, visualized small bowel and colon are grossly normal. Mild inflammation surrounding the posterior aspect of the stomach and the second and third portions of the duodenum. Vascular/Lymphatic: The aorta and branch vessels are normal. The major venous structures are patent. No mesenteric or retroperitoneal mass or adenopathy. Other:  No abdominal wall hernia or subcutaneous lesions. Musculoskeletal: No significant bony findings. Lower thoracic spine hemangiomas are noted IMPRESSION: 1. MR findings consistent with acute pancreatitis but no evidence of pancreatic necrosis or other complicating features. 2. Status post cholecystectomy with mild intrahepatic and moderate extrahepatic biliary dilatation, likely physiologic. No common bile duct stones, pancreatic head mass or ampullary lesion. Electronically Signed   By: Rudie Meyer M.D.   On: 05/30/2019 06:11   Mr Abdomen Mrcp Vivien Rossetti Contast  Result Date: 05/30/2019 CLINICAL DATA:  Acute pancreatitis. EXAM: MRI ABDOMEN WITHOUT AND WITH CONTRAST (INCLUDING MRCP) TECHNIQUE: Multiplanar multisequence MR imaging of the abdomen was performed both before and after the administration of intravenous contrast. Heavily T2-weighted images of the biliary and pancreatic ducts were obtained, and three-dimensional MRCP images were rendered by post processing. CONTRAST:  7.61mL GADAVIST GADOBUTROL 1 MMOL/ML IV SOLN COMPARISON:  CT scan 05/30/2019 FINDINGS: Lower chest: The lung bases are grossly clear. Minimal streaky basilar atelectasis but no pleural effusions. No pericardial effusion. Hepatobiliary: No focal hepatic lesions. There is mild intrahepatic biliary dilatation and moderate extrahepatic biliary dilatation. The common bile duct measures a maximum of 11.5 mm in the porta hepatis and 8.5 mm in the head of the pancreas. Cystic duct remnant noted. The gallbladder is surgically absent. No common bile duct stones  are identified. No pancreatic head mass or ampullary lesion. Pancreas: As demonstrated on the CT scan there is diffuse inflammation in and around the pancreas consistent with acute pancreatitis. There is some fluid in the anterior pararenal spaces and in the right pericolic gutter. There is also scattered mesenteric edema and fluid. The pancreas demonstrates relatively normal enhancement pattern without findings suspicious for pancreatic necrosis. Normal caliber and course of the main pancreatic duct with very slight dilatation distally near the ampulla where it measures 3.2 mm. Spleen:  Normal size.  No focal lesions. Adrenals/Urinary Tract: The adrenal glands and kidneys are unremarkable. Stomach/Bowel: The stomach, duodenum, visualized small bowel and colon are grossly normal. Mild inflammation surrounding the posterior aspect of the stomach and the second and third portions of the duodenum. Vascular/Lymphatic: The aorta and branch vessels are normal. The major venous structures are patent. No mesenteric or retroperitoneal mass or adenopathy. Other:  No abdominal wall hernia or subcutaneous lesions.  Musculoskeletal: No significant bony findings. Lower thoracic spine hemangiomas are noted IMPRESSION: 1. MR findings consistent with acute pancreatitis but no evidence of pancreatic necrosis or other complicating features. 2. Status post cholecystectomy with mild intrahepatic and moderate extrahepatic biliary dilatation, likely physiologic. No common bile duct stones, pancreatic head mass or ampullary lesion. Electronically Signed   By: Rudie Meyer M.D.   On: 05/30/2019 06:11    Scheduled Meds: . amLODipine  10 mg Oral Daily  . enoxaparin (LOVENOX) injection  40 mg Subcutaneous Q24H  . metoprolol tartrate  25 mg Oral BID  . oxyCODONE  10 mg Oral Q12H  . pantoprazole (PROTONIX) IV  40 mg Intravenous Q12H  . traZODone  100 mg Oral QHS   Continuous Infusions: . sodium chloride 125 mL/hr at 05/31/19 1220    PRN Meds:acetaminophen, ALPRAZolam, cyclobenzaprine, diphenhydrAMINE, HYDROmorphone (DILAUDID) injection, ondansetron (ZOFRAN) IV, senna  Assessment/Plan:  Principal Problem:   Pancreatitis Active Problems:   Benign hypertension with CKD (chronic kidney disease), stage II   Hyperlipidemia  Acute Pancreatitis - MRCP IMPRESSION:  1. MR findings consistent with acute pancreatitis but no evidence of pancreatic necrosis or other complicating features. 2. Status post cholecystectomy with mild intrahepatic and moderate extrahepatic biliary dilatation, likely physiologic. No common bile duct stones, pancreatic head mass or ampullary lesion. Was n.p.o.  Over the past 30 hours His lipase was in 140s to 160s on admission; now 39 Initial  LDH was 228 LFT is normal  Lipid panel is also essentially normal with triglyceride being 96  May start clear liquid diet and advance as tolerated if pain is improved Ns iv Adequate pain management and nausea vomiting management Benadryl  iv q6h prn  MRCP   Leukocytosis Admission WBC was 21.8 and on following day it was there is a still above 21K -Blood culture and urine culture -May consider starting on antibiotic -Consult GI if his abdominal pain persists  Hypertension Labile Cont home amlodipine  po qday -Is having tachycardia.  Check twelve-lead EKG. is likely from his pain that he is experiencing.  He also has history of anxiety for which she was taking Xanax Added Lopressor 25 mg twice daily with parameter  Anxiety Cont Xanax  Insomnia Cont Trazodone  po qhs prn   DVT Prophylaxis-   Lovenox - SCDs   AM Labs Ordered, also please review Full Orders  Family Communication: Admission, patients condition and plan of care including tests being ordered have been discussed with the patient  who indicate understanding and agree with the plan and Code Status.  Code Status:  FULL CODE per patient   LOS: 1 day    Brett Huber

## 2019-05-31 NOTE — Consult Note (Signed)
Vonda Antigua, MD [address] [place], [place], [place], [address] [address], [place], [place], [place], [address] Phone: [phone]  Fax: [phone]  Consultation  Referring Provider:     Dr. Izetta Dakin Primary Care Physician:  Olin Hauser, DO Reason for Consultation:     Abdominal pain  Date of Admission:  05/30/2019 Date of Consultation:  05/31/2019         HPI:   Brett Huber is a 53 y.o. male presents with epigastric abdominal pain, with CT revealing acute pancreatitis.  Sharp pain, nonradiating, 8/10, associated with nausea but no vomiting.  No altered bowel habits.  No blood in stool.  Pain is much better since admission  Patient has had previous history of pancreatitis, reportedly due to gallstone pancreatitis and status post cholecystectomy years ago.  Also previous history of alcohol use.  Continues to drink alcohol, hard liquor, Daily.  No smoking  Labs show mild elevation in lipase on admission.  Total bilirubin elevated to 2.6 with normal alk phos and transaminases.  Past Medical History:  Diagnosis Date   Anxiety    Chronic pain    DDD (degenerative disc disease)    Hyperlipidemia    Hypertension    Insomnia    Sinus congestion     Past Surgical History:  Procedure Laterality Date   CERVICAL FUSION  2004   CHOLECYSTECTOMY  2007   neck and disc replacement     SHOULDER ARTHROSCOPY WITH ROTATOR CUFF REPAIR AND SUBACROMIAL DECOMPRESSION Left 01/29/2013   Procedure: LEFT SHOULDER ARTHROSCOPY WITH ROTATOR CUFF REPAIR AND SUBACROMIAL DECOMPRESSION DISTAL CLAVICLE RESECTION;  Surgeon: Nita Sells, MD;  Location: [facility];  Service: Orthopedics;  Laterality: Left;    Prior to Admission medications   Medication Sig Start Date End Date Taking? Authorizing Provider  ALPRAZolam Duanne Moron) 1 MG tablet Take 1 tablet (1 mg total) by mouth at bedtime as needed for anxiety or sleep. 04/13/19  Yes Karamalegos,  Devonne Doughty, DO  amLODipine (NORVASC) 10 MG tablet Take 1 tablet (10 mg total) by mouth daily. 10/06/18  Yes Karamalegos, Devonne Doughty, DO  oxyCODONE-acetaminophen (PERCOCET/ROXICET) 5-325 MG tablet Take 1-2 tablets by mouth every 4 (four) hours as needed. 05/28/19  Yes [provider]  traZODone (DESYREL) 100 MG tablet TAKE 1 TABLET BY MOUTH AT BEDTIME 04/10/19  Yes Mikey College, NP  triamcinolone ointment (KENALOG) 0.5 % Apply 1 application topically 2 (two) times daily. 1-2 weeks, for hands. 10/06/18  Yes Karamalegos, Devonne Doughty, DO  cyclobenzaprine (FLEXERIL) 10 MG tablet Take 1 tablet (10 mg total) by mouth at bedtime as needed for muscle spasms. Patient not taking: Reported on 05/30/2019 04/05/19   Olin Hauser, DO  naproxen (NAPROSYN) 500 MG tablet Take 1 tablet (500 mg total) by mouth 2 (two) times daily with a meal. For 1-2 weeks then as needed Patient not taking: Reported on 05/30/2019 04/05/19   Olin Hauser, DO  tiZANidine (ZANAFLEX) 2 MG tablet Take 2 mg by mouth every 8 (eight) hours as needed. 05/28/19   [provider]    Family History  Problem Relation Age of Onset   Diabetes Mother    Stroke Father    Prostate cancer Neg Hx    Kidney cancer Neg Hx    Bladder Cancer Neg Hx      Social History   Tobacco Use   Smoking status: Never Smoker   Smokeless tobacco: Current User    Types:  Chew  Substance Use Topics   Alcohol use: Yes    Comment: occ   Drug use: No    Allergies as of 05/29/2019 - Review Complete 04/13/2019  Allergen Reaction Noted   Bee venom Shortness Of Breath 01/22/2013    Review of Systems:    All systems reviewed and negative except where noted in HPI.   Physical Exam:  Vital signs in last 24 hours: Vitals:   05/30/19 1727 05/30/19 1839 05/30/19 2130 05/31/19 0414  BP: (!) 158/94  (!) 146/95 (!) 155/96  Pulse: (!) 120 (!) 102 (!) 122 99  Resp:   20 18  Temp: 99 F (37.2 C)  99.3 F  (37.4 C) 98.6 F (37 C)  TempSrc: Oral  Oral Oral  SpO2: 92%  94% 96%  Weight:      Height:       Last BM Date: (unknown ) General:   Pleasant, cooperative in NAD Head:  Normocephalic and atraumatic. Eyes:   No icterus.   Conjunctiva pink. PERRLA. Ears:  Normal auditory acuity. Neck:  Supple; no masses or thyroidomegaly Lungs: Respirations even and unlabored. Lungs clear to auscultation bilaterally.   No wheezes, crackles, or rhonchi.  Abdomen:  Soft, nondistended, nontender. Normal bowel sounds. No appreciable masses or hepatomegaly.  No rebound or guarding.  Neurologic:  Alert and oriented x3;  grossly normal neurologically. Skin:  Intact without significant lesions or rashes. Cervical Nodes:  No significant cervical adenopathy. Psych:  Alert and cooperative. Normal affect.  LAB RESULTS: Recent Labs    05/29/19 2134 05/30/19 0641 05/31/19 0747  WBC 21.8* 21.7* 21.8*  HGB 15.9 14.6 13.9  HCT 43.2 40.1 37.9*  PLT 260 228 204   BMET Recent Labs    05/29/19 2134 05/30/19 0641 05/31/19 0747  NA 135 137 136  K 3.5 3.9 3.6  CL 98 102 102  CO2 '24 27 23  '$ GLUCOSE 144* 128* 121*  BUN '12 10 10  '$ CREATININE 0.90 0.92 0.80  CALCIUM 9.1 8.7* 9.0   LFT Recent Labs    05/31/19 0747  PROT 7.2  ALBUMIN 3.8  AST 25  ALT 24  ALKPHOS 54  BILITOT 2.6*   PT/INR No results for input(s): LABPROT, INR in the last 72 hours.  STUDIES: Ct Abdomen Pelvis W Contrast  Result Date: 05/30/2019 CLINICAL DATA:  Abdominal pain elevated lipase EXAM: CT ABDOMEN AND PELVIS WITH CONTRAST TECHNIQUE: Multidetector CT imaging of the abdomen and pelvis was performed using the standard protocol following bolus administration of intravenous contrast. CONTRAST:  110m OMNIPAQUE IOHEXOL 300 MG/ML  SOLN COMPARISON:  None. FINDINGS: Lower chest: The visualized heart size within normal limits. No pericardial fluid/thickening. No hiatal hernia. Streaky atelectasis seen at the right lung base.  Hepatobiliary: The liver is normal in density without focal abnormality.The main portal vein is patent. The patient is status post cholecystectomy. There is mild intrahepatic biliary ductal dilatation. The common bile duct appears to be mildly dilated measuring up to 9 mm. No definite choledocholithiasis is seen. Pancreas: Significant fat stranding changes are seen around the pancreatic head and proximal body. There is mild fatty atrophy seen throughout the pancreas. The mesenteric inflammatory changes extends through the right pericolic gutter. There is normal pancreatic enhancement. No peripancreatic fluid collections or areas of necrosis are seen. Spleen: Normal in size without focal abnormality. Adrenals/Urinary Tract: Both adrenal glands appear normal. The kidneys and collecting system appear normal without evidence of urinary tract calculus or hydronephrosis. Bladder is unremarkable. Stomach/Bowel: The  stomach is normal in appearance. There appears to be mild wall thickening involving the third portion of the duodenum, with surrounding fat stranding changes from the pancreas.The appendix is normal. Vascular/Lymphatic: There are no enlarged mesenteric, retroperitoneal, or pelvic lymph nodes. No significant vascular findings are present. Reproductive: The prostate is unremarkable. Other: No evidence of abdominal wall mass. Small fat containing inguinal hernias are noted. Musculoskeletal: No acute or significant osseous findings. IMPRESSION: 1. Findings consistent with acute pancreatitis involving the pancreatic head and midbody. Extensive inflammatory changes extending into the right pericolic gutter. No pancreatic pseudocyst or areas of definite necrosis. 2. Mild dilatation of the common bile duct measuring up to 1 cm which could be related to postsurgical dilation, no definite choledocholithiasis seen. 3. Mild wall thickening involving the third portion the duodenum, likely related to adjacent duodenitis.  Electronically Signed   By: Prudencio Pair M.D.   On: 05/30/2019 00:59   Mr 3d Recon At Scanner  Result Date: 05/30/2019 CLINICAL DATA:  Acute pancreatitis. EXAM: MRI ABDOMEN WITHOUT AND WITH CONTRAST (INCLUDING MRCP) TECHNIQUE: Multiplanar multisequence MR imaging of the abdomen was performed both before and after the administration of intravenous contrast. Heavily T2-weighted images of the biliary and pancreatic ducts were obtained, and three-dimensional MRCP images were rendered by post processing. CONTRAST:  7.44m GADAVIST GADOBUTROL 1 MMOL/ML IV SOLN COMPARISON:  CT scan 05/30/2019 FINDINGS: Lower chest: The lung bases are grossly clear. Minimal streaky basilar atelectasis but no pleural effusions. No pericardial effusion. Hepatobiliary: No focal hepatic lesions. There is mild intrahepatic biliary dilatation and moderate extrahepatic biliary dilatation. The common bile duct measures a maximum of 11.5 mm in the porta hepatis and 8.5 mm in the head of the pancreas. Cystic duct remnant noted. The gallbladder is surgically absent. No common bile duct stones are identified. No pancreatic head mass or ampullary lesion. Pancreas: As demonstrated on the CT scan there is diffuse inflammation in and around the pancreas consistent with acute pancreatitis. There is some fluid in the anterior pararenal spaces and in the right pericolic gutter. There is also scattered mesenteric edema and fluid. The pancreas demonstrates relatively normal enhancement pattern without findings suspicious for pancreatic necrosis. Normal caliber and course of the main pancreatic duct with very slight dilatation distally near the ampulla where it measures 3.2 mm. Spleen:  Normal size.  No focal lesions. Adrenals/Urinary Tract: The adrenal glands and kidneys are unremarkable. Stomach/Bowel: The stomach, duodenum, visualized small bowel and colon are grossly normal. Mild inflammation surrounding the posterior aspect of the stomach and the second  and third portions of the duodenum. Vascular/Lymphatic: The aorta and branch vessels are normal. The major venous structures are patent. No mesenteric or retroperitoneal mass or adenopathy. Other:  No abdominal wall hernia or subcutaneous lesions. Musculoskeletal: No significant bony findings. Lower thoracic spine hemangiomas are noted IMPRESSION: 1. MR findings consistent with acute pancreatitis but no evidence of pancreatic necrosis or other complicating features. 2. Status post cholecystectomy with mild intrahepatic and moderate extrahepatic biliary dilatation, likely physiologic. No common bile duct stones, pancreatic head mass or ampullary lesion. Electronically Signed   By: PMarijo SanesM.D.   On: 05/30/2019 06:11   Mr Abdomen Mrcp WMoise BoringContast  Result Date: 05/30/2019 CLINICAL DATA:  Acute pancreatitis. EXAM: MRI ABDOMEN WITHOUT AND WITH CONTRAST (INCLUDING MRCP) TECHNIQUE: Multiplanar multisequence MR imaging of the abdomen was performed both before and after the administration of intravenous contrast. Heavily T2-weighted images of the biliary and pancreatic ducts were obtained, and three-dimensional MRCP  images were rendered by post processing. CONTRAST:  7.70m GADAVIST GADOBUTROL 1 MMOL/ML IV SOLN COMPARISON:  CT scan 05/30/2019 FINDINGS: Lower chest: The lung bases are grossly clear. Minimal streaky basilar atelectasis but no pleural effusions. No pericardial effusion. Hepatobiliary: No focal hepatic lesions. There is mild intrahepatic biliary dilatation and moderate extrahepatic biliary dilatation. The common bile duct measures a maximum of 11.5 mm in the porta hepatis and 8.5 mm in the head of the pancreas. Cystic duct remnant noted. The gallbladder is surgically absent. No common bile duct stones are identified. No pancreatic head mass or ampullary lesion. Pancreas: As demonstrated on the CT scan there is diffuse inflammation in and around the pancreas consistent with acute pancreatitis. There is  some fluid in the anterior pararenal spaces and in the right pericolic gutter. There is also scattered mesenteric edema and fluid. The pancreas demonstrates relatively normal enhancement pattern without findings suspicious for pancreatic necrosis. Normal caliber and course of the main pancreatic duct with very slight dilatation distally near the ampulla where it measures 3.2 mm. Spleen:  Normal size.  No focal lesions. Adrenals/Urinary Tract: The adrenal glands and kidneys are unremarkable. Stomach/Bowel: The stomach, duodenum, visualized small bowel and colon are grossly normal. Mild inflammation surrounding the posterior aspect of the stomach and the second and third portions of the duodenum. Vascular/Lymphatic: The aorta and branch vessels are normal. The major venous structures are patent. No mesenteric or retroperitoneal mass or adenopathy. Other:  No abdominal wall hernia or subcutaneous lesions. Musculoskeletal: No significant bony findings. Lower thoracic spine hemangiomas are noted IMPRESSION: 1. MR findings consistent with acute pancreatitis but no evidence of pancreatic necrosis or other complicating features. 2. Status post cholecystectomy with mild intrahepatic and moderate extrahepatic biliary dilatation, likely physiologic. No common bile duct stones, pancreatic head mass or ampullary lesion. Electronically Signed   By: PMarijo SanesM.D.   On: 05/30/2019 06:11      Impression / Plan:   Brett KRAMPis a 53y.o. y/o male with previous history of cholecystectomy and pancreatitis, with epigastric abdominal pain and imaging consistent with pancreatitis without pancreatic necrosis  Continue pain management and IV fluids as necessary No evidence of biliary duct obstruction on imaging Etiology of pancreatitis is his ongoing alcohol use  He was strongly encouraged to abstain from all alcohol use  Intrahepatic and extrahepatic bile duct dilation reported to be physiologic on  imaging  Alk phos being normal is also consistent with no biliary duct obstruction  mildly elevated bilirubin noted and direct bilirubin sent for fractionation  CIWA protocol Folate, thiamine  Advance to low-fat diet as tolerated  Thank you for involving me in the care of this patient.      LOS: 1 day   VVirgel Manifold MD  05/31/2019, 3:58 PM

## 2019-05-31 NOTE — Consult Note (Signed)
Pharmacy Antibiotic Note  LEONDRO CORYELL is a 53 y.o. male admitted on 05/30/2019 with acute pancreatitis.  Pharmacy has been consulted for Zosyn dosing.  Plan: Start Zosyn 3.375g IV q8h (4 hour infusion).  Height: 5\' 9"  (175.3 cm) Weight: 175 lb (79.4 kg) IBW/kg (Calculated) : 70.7  Temp (24hrs), Avg:99 F (37.2 C), Min:98.6 F (37 C), Max:99.3 F (37.4 C)  Recent Labs  Lab 05/29/19 2134 05/30/19 0641 05/31/19 0747  WBC 21.8* 21.7* 21.8*  CREATININE 0.90 0.92 0.80    Estimated Creatinine Clearance: 104.3 mL/min (by C-G formula based on SCr of 0.8 mg/dL).    Antimicrobials this admission: Zosyn 12/3 >>   Microbiology results: 12/3 BCx: pending 12/2 SARS CoV-2: negative   Thank you for allowing pharmacy to be a part of this patient's care.  Dallie Piles 05/31/2019 2:20 PM

## 2019-06-01 LAB — COMPREHENSIVE METABOLIC PANEL
ALT: 22 U/L (ref 0–44)
AST: 19 U/L (ref 15–41)
Albumin: 3.4 g/dL — ABNORMAL LOW (ref 3.5–5.0)
Alkaline Phosphatase: 59 U/L (ref 38–126)
Anion gap: 11 (ref 5–15)
BUN: 11 mg/dL (ref 6–20)
CO2: 24 mmol/L (ref 22–32)
Calcium: 8.6 mg/dL — ABNORMAL LOW (ref 8.9–10.3)
Chloride: 99 mmol/L (ref 98–111)
Creatinine, Ser: 0.77 mg/dL (ref 0.61–1.24)
GFR calc Af Amer: >60 mL/min (ref >60)
GFR calc non Af Amer: >60 mL/min (ref >60)
Glucose, Bld: 136 mg/dL — ABNORMAL HIGH (ref 70–99)
Potassium: 3.1 mmol/L — ABNORMAL LOW (ref 3.5–5.1)
Sodium: 134 mmol/L — ABNORMAL LOW (ref 135–145)
Total Bilirubin: 2 mg/dL — ABNORMAL HIGH (ref 0.3–1.2)
Total Protein: 6.9 g/dL (ref 6.5–8.1)

## 2019-06-01 LAB — CBC WITH DIFFERENTIAL/PLATELET
Abs Immature Granulocytes: 0.13 10*3/uL — ABNORMAL HIGH (ref 0.00–0.07)
Basophils Absolute: 0 10*3/uL (ref 0.0–0.1)
Basophils Relative: 0 %
Eosinophils Absolute: 0.2 10*3/uL (ref 0.0–0.5)
Eosinophils Relative: 1 %
HCT: 36 % — ABNORMAL LOW (ref 39.0–52.0)
Hemoglobin: 12.8 g/dL — ABNORMAL LOW (ref 13.0–17.0)
Immature Granulocytes: 1 %
Lymphocytes Relative: 3 %
Lymphs Abs: 0.5 10*3/uL — ABNORMAL LOW (ref 0.7–4.0)
MCH: 30.3 pg (ref 26.0–34.0)
MCHC: 35.6 g/dL (ref 30.0–36.0)
MCV: 85.1 fL (ref 80.0–100.0)
Monocytes Absolute: 1.8 10*3/uL — ABNORMAL HIGH (ref 0.1–1.0)
Monocytes Relative: 11 %
Neutro Abs: 14.7 10*3/uL — ABNORMAL HIGH (ref 1.7–7.7)
Neutrophils Relative %: 84 %
Platelets: 227 10*3/uL (ref 150–400)
RBC: 4.23 MIL/uL (ref 4.22–5.81)
RDW: 13 % (ref 11.5–15.5)
WBC: 17.5 10*3/uL — ABNORMAL HIGH (ref 4.0–10.5)
nRBC: 0 % (ref 0.0–0.2)

## 2019-06-01 LAB — MAGNESIUM: Magnesium: 2.1 mg/dL (ref 1.7–2.4)

## 2019-06-01 LAB — BASIC METABOLIC PANEL
Anion gap: 10 (ref 5–15)
BUN: 12 mg/dL (ref 6–20)
CO2: 25 mmol/L (ref 22–32)
Calcium: 8.8 mg/dL — ABNORMAL LOW (ref 8.9–10.3)
Chloride: 103 mmol/L (ref 98–111)
Creatinine, Ser: 0.77 mg/dL (ref 0.61–1.24)
GFR calc Af Amer: >60 mL/min (ref >60)
GFR calc non Af Amer: >60 mL/min (ref >60)
Glucose, Bld: 130 mg/dL — ABNORMAL HIGH (ref 70–99)
Potassium: 3.3 mmol/L — ABNORMAL LOW (ref 3.5–5.1)
Sodium: 138 mmol/L (ref 135–145)

## 2019-06-01 LAB — URINE CULTURE: Culture: NO GROWTH

## 2019-06-01 LAB — CBC
HCT: 36.3 % — ABNORMAL LOW (ref 39.0–52.0)
Hemoglobin: 13.1 g/dL (ref 13.0–17.0)
MCH: 30.3 pg (ref 26.0–34.0)
MCHC: 36.1 g/dL — ABNORMAL HIGH (ref 30.0–36.0)
MCV: 83.8 fL (ref 80.0–100.0)
Platelets: 256 10*3/uL (ref 150–400)
RBC: 4.33 MIL/uL (ref 4.22–5.81)
RDW: 12.8 % (ref 11.5–15.5)
WBC: 16.2 10*3/uL — ABNORMAL HIGH (ref 4.0–10.5)
nRBC: 0 % (ref 0.0–0.2)

## 2019-06-01 LAB — PHOSPHORUS: Phosphorus: 1.5 mg/dL — ABNORMAL LOW (ref 2.5–4.6)

## 2019-06-01 LAB — LIPASE, BLOOD: Lipase: 47 U/L (ref 11–51)

## 2019-06-01 MED ORDER — ALPRAZOLAM 0.5 MG PO TABS
1.0000 mg | ORAL_TABLET | Freq: Every day | ORAL | Status: DC
  Administered 2019-06-01 – 2019-06-02 (×2): 1 mg via ORAL
  Filled 2019-06-01 (×2): qty 2

## 2019-06-01 MED ORDER — VITAMIN B-1 100 MG PO TABS
100.0000 mg | ORAL_TABLET | Freq: Every day | ORAL | Status: DC
  Administered 2019-06-01 – 2019-06-03 (×3): 100 mg via ORAL
  Filled 2019-06-01 (×3): qty 1

## 2019-06-01 MED ORDER — LORAZEPAM 1 MG PO TABS
1.0000 mg | ORAL_TABLET | ORAL | Status: DC | PRN
  Administered 2019-06-02 (×2): 1 mg via ORAL
  Filled 2019-06-01 (×2): qty 1

## 2019-06-01 MED ORDER — LORAZEPAM 2 MG/ML IJ SOLN: 0.0000 mg | Freq: Two times a day (BID) | INTRAMUSCULAR | Status: DC

## 2019-06-01 MED ORDER — SODIUM CHLORIDE 0.9 % IV SOLN
INTRAVENOUS | Status: DC | PRN
  Administered 2019-06-01 – 2019-06-03 (×5): 250 mL via INTRAVENOUS
  Administered 2019-06-03: 1000 mL via INTRAVENOUS

## 2019-06-01 MED ORDER — LORAZEPAM 2 MG/ML IJ SOLN: 0.0000 mg | Freq: Four times a day (QID) | INTRAMUSCULAR | Status: DC

## 2019-06-01 MED ORDER — ADULT MULTIVITAMIN W/MINERALS CH
1.0000 | ORAL_TABLET | Freq: Every day | ORAL | Status: DC
  Administered 2019-06-01 – 2019-06-03 (×3): 1 via ORAL
  Filled 2019-06-01 (×3): qty 1

## 2019-06-01 MED ORDER — FOLIC ACID 1 MG PO TABS
1.0000 mg | ORAL_TABLET | Freq: Every day | ORAL | Status: DC
  Administered 2019-06-01 – 2019-06-03 (×3): 1 mg via ORAL
  Filled 2019-06-01 (×3): qty 1

## 2019-06-01 MED ORDER — SODIUM CHLORIDE 0.9 % IV SOLN
INTRAVENOUS | Status: AC
  Administered 2019-06-01 – 2019-06-02 (×4): via INTRAVENOUS

## 2019-06-01 MED ORDER — POTASSIUM CHLORIDE CRYS ER 20 MEQ PO TBCR
40.0000 meq | EXTENDED_RELEASE_TABLET | Freq: Once | ORAL | Status: AC
  Administered 2019-06-01: 40 meq via ORAL
  Filled 2019-06-01: qty 2

## 2019-06-01 MED ORDER — THIAMINE HCL 100 MG/ML IJ SOLN
100.0000 mg | Freq: Every day | INTRAMUSCULAR | Status: DC
  Filled 2019-06-01: qty 2

## 2019-06-01 MED ORDER — LORAZEPAM 2 MG/ML IJ SOLN: 1.0000 mg | INTRAMUSCULAR | Status: DC | PRN

## 2019-06-01 NOTE — Consult Note (Signed)
[facility] for Electrolyte Monitoring and Replacement   Recent Labs: Potassium (mmol/L)  Date Value  06/01/2019 3.3 (L)   Magnesium (mg/dL)  Date Value  08/29/2016 2.2   Calcium (mg/dL)  Date Value  06/01/2019 8.8 (L)   Albumin (g/dL)  Date Value  05/31/2019 3.8   Phosphorus (mg/dL)  Date Value  08/28/2016 3.0   Sodium (mmol/L)  Date Value  06/01/2019 138  08/04/2016 141   Corrected Ca: 8.96 mg/dL  Assessment: 53 y.o. male presents with epigastric abdominal pain, with CT revealing acute pancreatitis.  Sharp pain, nonradiating, 8/10, associated with nausea but no vomiting. He has a h/o ongoing alcohol abuse and is therefore at a high risk of re-feeding syndrome.  Goal of Therapy:  Electrolytes WNL  Plan:   Replace potassium with oral KCl 40 mEq x1  Check phosphorous, magnesium and potassium daily until consistently WNL  Dallie Piles ,PharmD Clinical Pharmacist 06/01/2019 12:20 PM

## 2019-06-01 NOTE — Progress Notes (Signed)
Brief History:  Brett Huber a52 y.o.male,w hypertension, anxiety, recent R shoulder surgery apparently presents with c/o epigastric pain starting yesterday. Pt denies fever, chills, n/v, diarrhea, brbpr. Pt states that this pain felt like his prior pancreatitis, No recent medication changes. Admits Ethyl alcohol use.  Subjective: Pain much improved. Tolerated CLD well. Having some back spasm.   Objective: Vital signs in last 24 hours: Temp:  [98 F (36.7 C)-98.8 F (37.1 C)] 98.8 F (37.1 C) (12/04 0427) Pulse Rate:  [70-93] 70 (12/04 0427) Resp:  [16] 16 (12/04 0427) BP: (133-154)/(66-98) 133/84 (12/04 0427) SpO2:  [94 %-95 %] 94 % (12/04 0427) Weight:  [78.5 kg] 78.5 kg (12/04 0630)  Intake/Output from previous day: 12/03 0701 - 12/04 0700 In: 630.4 [I.V.:580.4] Out: 200 [Urine:200] Intake/Output this shift: No intake/output data recorded.  1.  General: axoxo3  2. Psychiatric: euthymic  3. Neurologic: nonfocal  4. HEENMT:  Anicteric, pupils 1.75mm symmetric, direct, consensual intact Neck: no jvd  5. Respiratory : CTAB  6. Cardiovascular : rrr s1, s2,   7. Gastrointestinal:  Abd: soft,  nd, +bs; mild Tender to palpation at the epigastric region.  No guarding.  And no rebound tenderness.  No change in skin color on the front or back on the abdomen or lower back.  8. Skin:  Ext: no c/c/e, no rash, negative cullens sign  9.Musculoskeletal:  Good ROM  Results for orders placed or performed during the hospital encounter of 05/30/19 (from the past 24 hour(s))  CULTURE, BLOOD (ROUTINE X 2) w Reflex to ID Panel     Status: None (Preliminary result)   Collection Time: 05/31/19  1:27 PM   Specimen: BLOOD  Result Value Ref Range   Specimen Description BLOOD BLOOD LEFT HAND    Special Requests      BOTTLES DRAWN AEROBIC AND ANAEROBIC Blood Culture adequate volume   Culture      NO GROWTH < 24 HOURS Performed at [facility], [address] [place], [place] [address]    Report Status PENDING   CULTURE, BLOOD (ROUTINE X 2) w Reflex to ID Panel     Status: None (Preliminary result)   Collection Time: 05/31/19  1:29 PM   Specimen: BLOOD  Result Value Ref Range   Specimen Description BLOOD LEFT ANTECUBITAL    Special Requests      BOTTLES DRAWN AEROBIC AND ANAEROBIC Blood Culture adequate volume   Culture      NO GROWTH < 24 HOURS Performed at [facility], [address] [place], [place] [address]    Report Status PENDING   CBC with Differential/Platelet     Status: Abnormal   Collection Time: 06/01/19  4:16 AM  Result Value Ref Range   WBC 17.5 (H) 4.0 - 10.5 K/uL   RBC 4.23 4.22 - 5.81 MIL/uL   Hemoglobin 12.8 (L) 13.0 - 17.0 g/dL   HCT 53.6 (L) 14.4 - 31.5 %   MCV 85.1 80.0 - 100.0 fL   MCH 30.3 26.0 - 34.0 pg   MCHC 35.6 30.0 - 36.0 g/dL   RDW 40.0 86.7 - 61.9 %   Platelets 227 150 - 400 K/uL   nRBC 0.0 0.0 - 0.2 %   Neutrophils Relative % 84 %   Neutro Abs 14.7 (H) 1.7 - 7.7 K/uL   Lymphocytes Relative 3 %   Lymphs Abs 0.5 (L) 0.7 - 4.0 K/uL   Monocytes Relative 11 %   Monocytes Absolute 1.8 (H) 0.1 - 1.0  K/uL   Eosinophils Relative 1 %   Eosinophils Absolute 0.2 0.0 - 0.5 K/uL   Basophils Relative 0 %   Basophils Absolute 0.0 0.0 - 0.1 K/uL   Immature Granulocytes 1 %   Abs Immature Granulocytes 0.13 (H) 0.00 - 0.07 K/uL  Lipase, blood     Status: None   Collection Time: 06/01/19  4:16 AM  Result Value Ref Range   Lipase 47 11 - 51 U/L  Basic metabolic panel     Status: Abnormal   Collection Time: 06/01/19  4:16 AM  Result Value Ref Range   Sodium 138 135 - 145 mmol/L   Potassium 3.3 (L) 3.5 - 5.1 mmol/L   Chloride 103 98 - 111 mmol/L   CO2 25 22 - 32 mmol/L   Glucose, Bld 130 (H) 70 - 99 mg/dL   BUN 12 6 - 20 mg/dL   Creatinine, Ser 1.610.77 0.61 - 1.24 mg/dL   Calcium 8.8 (L) 8.9 - 10.3 mg/dL   GFR calc non Af Amer >60 >60 mL/min   GFR calc Af Amer >60 >60 mL/min   Anion  gap 10 5 - 15    Studies/Results: Ct Abdomen Pelvis W Contrast  Result Date: 05/30/2019 CLINICAL DATA:  Abdominal pain elevated lipase EXAM: CT ABDOMEN AND PELVIS WITH CONTRAST TECHNIQUE: Multidetector CT imaging of the abdomen and pelvis was performed using the standard protocol following bolus administration of intravenous contrast. CONTRAST:  100mL OMNIPAQUE IOHEXOL 300 MG/ML  SOLN COMPARISON:  None. FINDINGS: Lower chest: The visualized heart size within normal limits. No pericardial fluid/thickening. No hiatal hernia. Streaky atelectasis seen at the right lung base. Hepatobiliary: The liver is normal in density without focal abnormality.The main portal vein is patent. The patient is status post cholecystectomy. There is mild intrahepatic biliary ductal dilatation. The common bile duct appears to be mildly dilated measuring up to 9 mm. No definite choledocholithiasis is seen. Pancreas: Significant fat stranding changes are seen around the pancreatic head and proximal body. There is mild fatty atrophy seen throughout the pancreas. The mesenteric inflammatory changes extends through the right pericolic gutter. There is normal pancreatic enhancement. No peripancreatic fluid collections or areas of necrosis are seen. Spleen: Normal in size without focal abnormality. Adrenals/Urinary Tract: Both adrenal glands appear normal. The kidneys and collecting system appear normal without evidence of urinary tract calculus or hydronephrosis. Bladder is unremarkable. Stomach/Bowel: The stomach is normal in appearance. There appears to be mild wall thickening involving the third portion of the duodenum, with surrounding fat stranding changes from the pancreas.The appendix is normal. Vascular/Lymphatic: There are no enlarged mesenteric, retroperitoneal, or pelvic lymph nodes. No significant vascular findings are present. Reproductive: The prostate is unremarkable. Other: No evidence of abdominal wall mass. Small fat  containing inguinal hernias are noted. Musculoskeletal: No acute or significant osseous findings. IMPRESSION: 1. Findings consistent with acute pancreatitis involving the pancreatic head and midbody. Extensive inflammatory changes extending into the right pericolic gutter. No pancreatic pseudocyst or areas of definite necrosis. 2. Mild dilatation of the common bile duct measuring up to 1 cm which could be related to postsurgical dilation, no definite choledocholithiasis seen. 3. Mild wall thickening involving the third portion the duodenum, likely related to adjacent duodenitis. Electronically Signed   By: Jonna ClarkBindu  Avutu M.D.   On: 05/30/2019 00:59   Mr 3d Recon At Scanner  Result Date: 05/30/2019 CLINICAL DATA:  Acute pancreatitis. EXAM: MRI ABDOMEN WITHOUT AND WITH CONTRAST (INCLUDING MRCP) TECHNIQUE: Multiplanar multisequence MR imaging of the  abdomen was performed both before and after the administration of intravenous contrast. Heavily T2-weighted images of the biliary and pancreatic ducts were obtained, and three-dimensional MRCP images were rendered by post processing. CONTRAST:  7.44mL GADAVIST GADOBUTROL 1 MMOL/ML IV SOLN COMPARISON:  CT scan 05/30/2019 FINDINGS: Lower chest: The lung bases are grossly clear. Minimal streaky basilar atelectasis but no pleural effusions. No pericardial effusion. Hepatobiliary: No focal hepatic lesions. There is mild intrahepatic biliary dilatation and moderate extrahepatic biliary dilatation. The common bile duct measures a maximum of 11.5 mm in the porta hepatis and 8.5 mm in the head of the pancreas. Cystic duct remnant noted. The gallbladder is surgically absent. No common bile duct stones are identified. No pancreatic head mass or ampullary lesion. Pancreas: As demonstrated on the CT scan there is diffuse inflammation in and around the pancreas consistent with acute pancreatitis. There is some fluid in the anterior pararenal spaces and in the right pericolic gutter.  There is also scattered mesenteric edema and fluid. The pancreas demonstrates relatively normal enhancement pattern without findings suspicious for pancreatic necrosis. Normal caliber and course of the main pancreatic duct with very slight dilatation distally near the ampulla where it measures 3.2 mm. Spleen:  Normal size.  No focal lesions. Adrenals/Urinary Tract: The adrenal glands and kidneys are unremarkable. Stomach/Bowel: The stomach, duodenum, visualized small bowel and colon are grossly normal. Mild inflammation surrounding the posterior aspect of the stomach and the second and third portions of the duodenum. Vascular/Lymphatic: The aorta and branch vessels are normal. The major venous structures are patent. No mesenteric or retroperitoneal mass or adenopathy. Other:  No abdominal wall hernia or subcutaneous lesions. Musculoskeletal: No significant bony findings. Lower thoracic spine hemangiomas are noted IMPRESSION: 1. MR findings consistent with acute pancreatitis but no evidence of pancreatic necrosis or other complicating features. 2. Status post cholecystectomy with mild intrahepatic and moderate extrahepatic biliary dilatation, likely physiologic. No common bile duct stones, pancreatic head mass or ampullary lesion. Electronically Signed   By: Marijo Sanes M.D.   On: 05/30/2019 06:11   Mr Abdomen Mrcp Moise Boring Contast  Result Date: 05/30/2019 CLINICAL DATA:  Acute pancreatitis. EXAM: MRI ABDOMEN WITHOUT AND WITH CONTRAST (INCLUDING MRCP) TECHNIQUE: Multiplanar multisequence MR imaging of the abdomen was performed both before and after the administration of intravenous contrast. Heavily T2-weighted images of the biliary and pancreatic ducts were obtained, and three-dimensional MRCP images were rendered by post processing. CONTRAST:  7.32mL GADAVIST GADOBUTROL 1 MMOL/ML IV SOLN COMPARISON:  CT scan 05/30/2019 FINDINGS: Lower chest: The lung bases are grossly clear. Minimal streaky basilar atelectasis but  no pleural effusions. No pericardial effusion. Hepatobiliary: No focal hepatic lesions. There is mild intrahepatic biliary dilatation and moderate extrahepatic biliary dilatation. The common bile duct measures a maximum of 11.5 mm in the porta hepatis and 8.5 mm in the head of the pancreas. Cystic duct remnant noted. The gallbladder is surgically absent. No common bile duct stones are identified. No pancreatic head mass or ampullary lesion. Pancreas: As demonstrated on the CT scan there is diffuse inflammation in and around the pancreas consistent with acute pancreatitis. There is some fluid in the anterior pararenal spaces and in the right pericolic gutter. There is also scattered mesenteric edema and fluid. The pancreas demonstrates relatively normal enhancement pattern without findings suspicious for pancreatic necrosis. Normal caliber and course of the main pancreatic duct with very slight dilatation distally near the ampulla where it measures 3.2 mm. Spleen:  Normal size.  No focal lesions.  Adrenals/Urinary Tract: The adrenal glands and kidneys are unremarkable. Stomach/Bowel: The stomach, duodenum, visualized small bowel and colon are grossly normal. Mild inflammation surrounding the posterior aspect of the stomach and the second and third portions of the duodenum. Vascular/Lymphatic: The aorta and branch vessels are normal. The major venous structures are patent. No mesenteric or retroperitoneal mass or adenopathy. Other:  No abdominal wall hernia or subcutaneous lesions. Musculoskeletal: No significant bony findings. Lower thoracic spine hemangiomas are noted IMPRESSION: 1. MR findings consistent with acute pancreatitis but no evidence of pancreatic necrosis or other complicating features. 2. Status post cholecystectomy with mild intrahepatic and moderate extrahepatic biliary dilatation, likely physiologic. No common bile duct stones, pancreatic head mass or ampullary lesion. Electronically Signed   By: Rudie Meyer M.D.   On: 05/30/2019 06:11    Scheduled Meds: . amLODipine  10 mg Oral Daily  . enoxaparin (LOVENOX) injection  40 mg Subcutaneous Q24H  . metoprolol tartrate  25 mg Oral BID  . oxyCODONE  10 mg Oral Q12H  . pantoprazole (PROTONIX) IV  40 mg Intravenous Q12H  . traZODone  100 mg Oral QHS   Continuous Infusions: . sodium chloride    . piperacillin-tazobactam 3.375 g (06/01/19 1610)   PRN Meds:acetaminophen, ALPRAZolam, cyclobenzaprine, diphenhydrAMINE, HYDROmorphone (DILAUDID) injection, ondansetron (ZOFRAN) IV, senna  Assessment/Plan:  Principal Problem:   Pancreatitis Active Problems:   Benign hypertension with CKD (chronic kidney disease), stage II   Hyperlipidemia  Acute Pancreatitis:  - MRCP IMPRESSION: Much improved  1. MR findings consistent with acute pancreatitis but no evidence of pancreatic necrosis or other complicating features. 2. Status post cholecystectomy with mild intrahepatic and moderate extrahepatic biliary dilatation, likely physiologic. No common bile duct stones, pancreatic head mass or ampullary lesion. Was n.p.o. tolerating clear liquid diet well; will advance the diet to low-fat low-cholesterol diet as tolerated His lipase was in 140s to 160s on admission; now 39 Lipid panel is also essentially normal with triglyceride being 96  Fluid Adequate pain management and nausea vomiting management Benadryl  iv q6h prn  GI consulted.  No intervention indicated at this time.  Recommended advance diet to low-fat diet as tolerated Appreciate GI recs   Leukocytosis: Improving Admission WBC was 21.8 gradually trending down -Blood culture and urine culture--so far no growth -Zosyn started on 05/01/2019.  We will continue that since his condition improving along with the white count -GI recommendation as above  Hypertension Improving Cont home amlodipine  po qday  He also has history of anxiety for which she was taking Xanax.  Which was  not given to the patient as per patient because it was as it was started as as needed Added Lopressor 25 mg twice daily with parameter; continue  Anxiety Cont Xanax scheduled as home dose  Insomnia Cont Trazodone  po qhs prn   DVT Prophylaxis-   Lovenox - SCDs   AM Labs Ordered, also please review Full Orders  Family Communication: Admission, patients condition and plan of care including tests being ordered have been discussed with the patient  who indicate understanding and agree with the plan and Code Status.  Code Status:  FULL CODE per patient   LOS: 2 days   Tiffny Gemmer Harold Hedge

## 2019-06-01 NOTE — Consult Note (Signed)
Pharmacy Antibiotic Note  Brett Huber is a 53 y.o. male admitted on 05/30/2019 with acute pancreatitis.  Pharmacy was consulted for Zosyn dosing. This is day #2 of antibiotics, leukocytosis is improved slightly but remains elevated, there are no recent fevers and renal function is stable.  Plan: continue Zosyn 3.375g IV q8h (4 hour infusion).  Height: 5\' 9"  (175.3 cm) Weight: 173 lb (78.5 kg) IBW/kg (Calculated) : 70.7  Temp (24hrs), Avg:98.5 F (36.9 C), Min:98 F (36.7 C), Max:98.8 F (37.1 C)  Recent Labs  Lab 05/29/19 2134 05/30/19 0641 05/31/19 0747 06/01/19 0416  WBC 21.8* 21.7* 21.8* 17.5*  CREATININE 0.90 0.92 0.80 0.77    Estimated Creatinine Clearance: 104.3 mL/min (by C-G formula based on SCr of 0.77 mg/dL).    Antimicrobials this admission: Zosyn 12/3 >>   Microbiology results: 12/3 BCx: pending 12/2 SARS CoV-2: negative   Thank you for allowing pharmacy to be a part of this patient's care.  Dallie Piles 06/01/2019 10:51 AM

## 2019-06-01 NOTE — Progress Notes (Signed)
Melodie Bouillon, MD [address], [address], [place], [place], [address] [address], [place], [place], [address] Phone: [phone]  Fax: [phone]   Subjective: Abdominal pain continues to improve. Ambulating well   Objective: Exam: Vital signs in last 24 hours: Vitals:   06/01/19 0427 06/01/19 0630 06/01/19 0955 06/01/19 1400  BP: 133/84  (!) 144/81 139/72  Pulse: 70  76 86  Resp: 16  18 17   Temp: 98.8 F (37.1 C)  98.6 F (37 C) 98.1 F (36.7 C)  TempSrc: Oral  Oral   SpO2: 94%  96% 93%  Weight:  78.5 kg    Height:       Weight change:   Intake/Output Summary (Last 24 hours) at 06/01/2019 1510 Last data filed at 06/01/2019 0900 Gross per 24 hour  Intake 730.96 ml  Output 200 ml  Net 530.96 ml    General: No acute distress, AAO x3 Abd: Soft, NT/ND, No HSM Skin: Warm, no rashes Neck: Supple, Trachea midline   Lab Results: Lab Results  Component Value Date   WBC 17.5 (H) 06/01/2019   HGB 12.8 (L) 06/01/2019   HCT 36.0 (L) 06/01/2019   MCV 85.1 06/01/2019   PLT 227 06/01/2019   Micro Results: Recent Results (from the past 240 hour(s))  SARS CORONAVIRUS 2 (TAT 6-24 HRS) Nasopharyngeal Nasopharyngeal Swab     Status: None   Collection Time: 05/30/19  2:27 AM   Specimen: Nasopharyngeal Swab  Result Value Ref Range Status   SARS Coronavirus 2 NEGATIVE NEGATIVE Final    Comment: (NOTE) SARS-CoV-2 target nucleic acids are NOT DETECTED. The SARS-CoV-2 RNA is generally detectable in upper and lower respiratory specimens during the acute phase of infection. Negative results do not preclude SARS-CoV-2 infection, do not rule out co-infections with other pathogens, and should not be used as the sole basis for treatment or other patient management decisions. Negative results must be combined with clinical observations, patient history, and epidemiological information. The expected result is Negative. Fact Sheet for Patients:  14/02/20 Fact Sheet for Healthcare Providers: HairSlick.no This test is not yet approved or cleared by the quierodirigir.com FDA and  has been authorized for detection and/or diagnosis of SARS-CoV-2 by FDA under an Emergency Use Authorization (EUA). This EUA will remain  in effect (meaning this test can be used) for the duration of the COVID-19 declaration under Section 56 4(b)(1) of the Act, 21 U.S.C. section 360bbb-3(b)(1), unless the authorization is terminated or revoked sooner. Performed at [facility], [address] [place], [place]   CULTURE, BLOOD (ROUTINE X 2) w Reflex to ID Panel     Status: None (Preliminary result)   Collection Time: 05/31/19  1:27 PM   Specimen: BLOOD  Result Value Ref Range Status   Specimen Description BLOOD BLOOD LEFT HAND  Final   Special Requests   Final    BOTTLES DRAWN AEROBIC AND ANAEROBIC Blood Culture adequate volume   Culture   Final    NO GROWTH < 24 HOURS Performed at [facility], [address], [place], [place]    Report Status PENDING  Incomplete  CULTURE, BLOOD (ROUTINE X 2) w Reflex to ID Panel     Status: None (Preliminary result)   Collection Time: 05/31/19  1:29 PM   Specimen: BLOOD  Result Value Ref Range Status   Specimen Description BLOOD LEFT ANTECUBITAL  Final   Special Requests   Final    BOTTLES DRAWN  AEROBIC AND ANAEROBIC Blood Culture adequate volume   Culture   Final    NO GROWTH < 24 HOURS Performed at [facility], [address] [place], [place] [address]    Report Status PENDING  Incomplete   Studies/Results: No results found. Medications:  Scheduled Meds: . ALPRAZolam  1 mg Oral QHS  . amLODipine  10 mg Oral Daily  . enoxaparin (LOVENOX) injection  40 mg Subcutaneous Q24H  . metoprolol tartrate  25 mg Oral BID  . oxyCODONE  10 mg Oral Q12H  . pantoprazole (PROTONIX) IV  40 mg Intravenous Q12H  .  traZODone  100 mg Oral QHS   Continuous Infusions: . sodium chloride 100 mL/hr at 06/01/19 1304  . piperacillin-tazobactam 3.375 g (06/01/19 1304)   PRN Meds:.acetaminophen, cyclobenzaprine, diphenhydrAMINE, HYDROmorphone (DILAUDID) injection, ondansetron (ZOFRAN) IV, senna   Assessment: Principal Problem:   Pancreatitis Active Problems:   Benign hypertension with CKD (chronic kidney disease), stage II   Hyperlipidemia    Plan: Continue to advance diet as tolerated Abstain from all alcohol use White count improving and likely was an inflammatory response  No fever or chills  Continue oral hydration or IV fluids depending on clinical status  Pain control as needed  GI service will sign off at this time, please page with any questions or concerns   LOS: 2 days   Vonda Antigua, MD 06/01/2019, 3:10 PM

## 2019-06-02 ENCOUNTER — Inpatient Hospital Stay: Payer: PRIVATE HEALTH INSURANCE

## 2019-06-02 LAB — BASIC METABOLIC PANEL
Anion gap: 12 (ref 5–15)
BUN: 10 mg/dL (ref 6–20)
CO2: 23 mmol/L (ref 22–32)
Calcium: 8.6 mg/dL — ABNORMAL LOW (ref 8.9–10.3)
Chloride: 98 mmol/L (ref 98–111)
Creatinine, Ser: 0.85 mg/dL (ref 0.61–1.24)
GFR calc Af Amer: >60 mL/min (ref >60)
GFR calc non Af Amer: >60 mL/min (ref >60)
Glucose, Bld: 103 mg/dL — ABNORMAL HIGH (ref 70–99)
Potassium: 2.9 mmol/L — ABNORMAL LOW (ref 3.5–5.1)
Sodium: 133 mmol/L — ABNORMAL LOW (ref 135–145)

## 2019-06-02 LAB — LIPASE, BLOOD: Lipase: 45 U/L (ref 11–51)

## 2019-06-02 LAB — CBC WITH DIFFERENTIAL/PLATELET
Abs Immature Granulocytes: 0.14 10*3/uL — ABNORMAL HIGH (ref 0.00–0.07)
Basophils Absolute: 0.1 10*3/uL (ref 0.0–0.1)
Basophils Relative: 0 %
Eosinophils Absolute: 0.4 10*3/uL (ref 0.0–0.5)
Eosinophils Relative: 3 %
HCT: 36.2 % — ABNORMAL LOW (ref 39.0–52.0)
Hemoglobin: 13.5 g/dL (ref 13.0–17.0)
Immature Granulocytes: 1 %
Lymphocytes Relative: 4 %
Lymphs Abs: 0.7 10*3/uL (ref 0.7–4.0)
MCH: 30.3 pg (ref 26.0–34.0)
MCHC: 37.3 g/dL — ABNORMAL HIGH (ref 30.0–36.0)
MCV: 81.3 fL (ref 80.0–100.0)
Monocytes Absolute: 2.1 10*3/uL — ABNORMAL HIGH (ref 0.1–1.0)
Monocytes Relative: 13 %
Neutro Abs: 12.6 10*3/uL — ABNORMAL HIGH (ref 1.7–7.7)
Neutrophils Relative %: 79 %
Platelets: 276 10*3/uL (ref 150–400)
RBC: 4.45 MIL/uL (ref 4.22–5.81)
RDW: 12.6 % (ref 11.5–15.5)
WBC: 15.9 10*3/uL — ABNORMAL HIGH (ref 4.0–10.5)
nRBC: 0 % (ref 0.0–0.2)

## 2019-06-02 LAB — PHOSPHORUS: Phosphorus: 2 mg/dL — ABNORMAL LOW (ref 2.5–4.6)

## 2019-06-02 LAB — MAGNESIUM: Magnesium: 2.2 mg/dL (ref 1.7–2.4)

## 2019-06-02 MED ORDER — MUSCLE RUB 10-15 % EX CREA
TOPICAL_CREAM | CUTANEOUS | Status: DC | PRN
  Filled 2019-06-02: qty 85

## 2019-06-02 MED ORDER — BISACODYL 5 MG PO TBEC: 5.0000 mg | DELAYED_RELEASE_TABLET | Freq: Every day | ORAL | Status: DC | PRN

## 2019-06-02 MED ORDER — BISACODYL 5 MG PO TBEC: 5.0000 mg | DELAYED_RELEASE_TABLET | Freq: Two times a day (BID) | ORAL | Status: DC | PRN

## 2019-06-02 MED ORDER — FLEET ENEMA 7-19 GM/118ML RE ENEM: 1.0000 | ENEMA | Freq: Once | RECTAL | Status: DC | PRN

## 2019-06-02 MED ORDER — K PHOS MONO-SOD PHOS DI & MONO 155-852-130 MG PO TABS
500.0000 mg | ORAL_TABLET | ORAL | Status: AC
  Administered 2019-06-02 (×2): 500 mg via ORAL
  Filled 2019-06-02 (×2): qty 2

## 2019-06-02 MED ORDER — DOCUSATE SODIUM 100 MG PO CAPS
100.0000 mg | ORAL_CAPSULE | Freq: Two times a day (BID) | ORAL | Status: DC | PRN
  Administered 2019-06-02: 100 mg via ORAL
  Filled 2019-06-02: qty 1

## 2019-06-02 MED ORDER — PSYLLIUM 95 % PO PACK
1.0000 | PACK | Freq: Every day | ORAL | Status: DC
  Administered 2019-06-02: 1 via ORAL
  Filled 2019-06-02 (×3): qty 1

## 2019-06-02 MED ORDER — MAGNESIUM HYDROXIDE 400 MG/5ML PO SUSP
30.0000 mL | Freq: Every day | ORAL | Status: DC
  Administered 2019-06-02 – 2019-06-03 (×2): 30 mL via ORAL
  Filled 2019-06-02 (×2): qty 30

## 2019-06-02 MED ORDER — POTASSIUM CHLORIDE 10 MEQ/100ML IV SOLN
10.0000 meq | INTRAVENOUS | Status: AC
  Administered 2019-06-02 (×6): 10 meq via INTRAVENOUS
  Filled 2019-06-02 (×5): qty 100

## 2019-06-02 NOTE — Progress Notes (Signed)
Brief History:  Brett Huber a1 y.o.male,w hypertension, anxiety, recent R shoulder surgery apparently presents with c/o epigastric pain starting yesterday. Pt denies fever, chills, n/v, diarrhea, brbpr. Pt states that this pain felt like his prior pancreatitis, No recent medication changes. Admits Ethyl alcohol use.  Subjective: Pain much improved. Tolerated Low fat diet well.    Objective: Vital signs in last 24 hours: Temp:  [98.1 F (36.7 C)-98.8 F (37.1 C)] 98.7 F (37.1 C) (12/05 1251) Pulse Rate:  [82-100] 82 (12/05 1251) Resp:  [16-18] 18 (12/05 1251) BP: (138-143)/(72-89) 138/85 (12/05 1251) SpO2:  [93 %-97 %] 93 % (12/05 1251)  Intake/Output from previous day: 12/04 0701 - 12/05 0700 In: 1408.6 [P.O.:360; I.V.:1048.6] Out: 0  Intake/Output this shift: Total I/O In: 240 [P.O.:240] Out: -   1.  General: axoxo3  2. Psychiatric: euthymic  3. Neurologic: nonfocal  4. HEENMT:  Anicteric, pupils 1.5mm symmetric, direct, consensual intact Neck: no jvd  5. Respiratory : CTAB  6. Cardiovascular : rrr s1, s2,   7. Gastrointestinal:  Abd: soft,  nd, +bs; mild Tender to palpation at the epigastric region.  No guarding.  And no rebound tenderness.  No change in skin color on the front or back on the abdomen or lower back.  8. Skin:  Ext: no c/c/e, no rash, negative cullens sign  9.Musculoskeletal:  Good ROM  Results for orders placed or performed during the hospital encounter of 05/30/19 (from the past 24 hour(s))  Comprehensive metabolic panel     Status: Abnormal   Collection Time: 06/01/19  9:03 PM  Result Value Ref Range   Sodium 134 (L) 135 - 145 mmol/L   Potassium 3.1 (L) 3.5 - 5.1 mmol/L   Chloride 99 98 - 111 mmol/L   CO2 24 22 - 32 mmol/L   Glucose, Bld 136 (H) 70 - 99 mg/dL   BUN 11 6 - 20 mg/dL   Creatinine, Ser 0.77 0.61 - 1.24 mg/dL   Calcium 8.6 (L) 8.9 - 10.3 mg/dL   Total Protein 6.9 6.5 - 8.1 g/dL   Albumin 3.4  (L) 3.5 - 5.0 g/dL   AST 19 15 - 41 U/L   ALT 22 0 - 44 U/L   Alkaline Phosphatase 59 38 - 126 U/L   Total Bilirubin 2.0 (H) 0.3 - 1.2 mg/dL   GFR calc non Af Amer >60 >60 mL/min   GFR calc Af Amer >60 >60 mL/min   Anion gap 11 5 - 15  Magnesium     Status: None   Collection Time: 06/01/19  9:03 PM  Result Value Ref Range   Magnesium 2.1 1.7 - 2.4 mg/dL  Phosphorus     Status: Abnormal   Collection Time: 06/01/19  9:03 PM  Result Value Ref Range   Phosphorus 1.5 (L) 2.5 - 4.6 mg/dL  CBC     Status: Abnormal   Collection Time: 06/01/19  9:03 PM  Result Value Ref Range   WBC 16.2 (H) 4.0 - 10.5 K/uL   RBC 4.33 4.22 - 5.81 MIL/uL   Hemoglobin 13.1 13.0 - 17.0 g/dL   HCT 36.3 (L) 39.0 - 52.0 %   MCV 83.8 80.0 - 100.0 fL   MCH 30.3 26.0 - 34.0 pg   MCHC 36.1 (H) 30.0 - 36.0 g/dL   RDW 12.8 11.5 - 15.5 %   Platelets 256 150 - 400 K/uL   nRBC 0.0 0.0 - 0.2 %  CBC with Differential/Platelet     Status: Abnormal  Collection Time: 06/02/19  7:10 AM  Result Value Ref Range   WBC 15.9 (H) 4.0 - 10.5 K/uL   RBC 4.45 4.22 - 5.81 MIL/uL   Hemoglobin 13.5 13.0 - 17.0 g/dL   HCT 75.9 (L) 16.3 - 84.6 %   MCV 81.3 80.0 - 100.0 fL   MCH 30.3 26.0 - 34.0 pg   MCHC 37.3 (H) 30.0 - 36.0 g/dL   RDW 65.9 93.5 - 70.1 %   Platelets 276 150 - 400 K/uL   nRBC 0.0 0.0 - 0.2 %   Neutrophils Relative % 79 %   Neutro Abs 12.6 (H) 1.7 - 7.7 K/uL   Lymphocytes Relative 4 %   Lymphs Abs 0.7 0.7 - 4.0 K/uL   Monocytes Relative 13 %   Monocytes Absolute 2.1 (H) 0.1 - 1.0 K/uL   Eosinophils Relative 3 %   Eosinophils Absolute 0.4 0.0 - 0.5 K/uL   Basophils Relative 0 %   Basophils Absolute 0.1 0.0 - 0.1 K/uL   Immature Granulocytes 1 %   Abs Immature Granulocytes 0.14 (H) 0.00 - 0.07 K/uL  Magnesium     Status: None   Collection Time: 06/02/19  7:10 AM  Result Value Ref Range   Magnesium 2.2 1.7 - 2.4 mg/dL  Lipase, blood     Status: None   Collection Time: 06/02/19  7:10 AM  Result Value Ref  Range   Lipase 45 11 - 51 U/L  Phosphorus     Status: Abnormal   Collection Time: 06/02/19  7:10 AM  Result Value Ref Range   Phosphorus 2.0 (L) 2.5 - 4.6 mg/dL  Basic metabolic panel     Status: Abnormal   Collection Time: 06/02/19  7:10 AM  Result Value Ref Range   Sodium 133 (L) 135 - 145 mmol/L   Potassium 2.9 (L) 3.5 - 5.1 mmol/L   Chloride 98 98 - 111 mmol/L   CO2 23 22 - 32 mmol/L   Glucose, Bld 103 (H) 70 - 99 mg/dL   BUN 10 6 - 20 mg/dL   Creatinine, Ser 7.79 0.61 - 1.24 mg/dL   Calcium 8.6 (L) 8.9 - 10.3 mg/dL   GFR calc non Af Amer >60 >60 mL/min   GFR calc Af Amer >60 >60 mL/min   Anion gap 12 5 - 15    Studies/Results: Ct Abdomen Pelvis W Contrast  Result Date: 05/30/2019 CLINICAL DATA:  Abdominal pain elevated lipase EXAM: CT ABDOMEN AND PELVIS WITH CONTRAST TECHNIQUE: Multidetector CT imaging of the abdomen and pelvis was performed using the standard protocol following bolus administration of intravenous contrast. CONTRAST:  OMNIPAQUE IOHEXOL 300 MG/ML  SOLN COMPARISON:  None. FINDINGS: Lower chest: The visualized heart size within normal limits. No pericardial fluid/thickening. No hiatal hernia. Streaky atelectasis seen at the right lung base. Hepatobiliary: The liver is normal in density without focal abnormality.The main portal vein is patent. The patient is status post cholecystectomy. There is mild intrahepatic biliary ductal dilatation. The common bile duct appears to be mildly dilated measuring up to 9 mm. No definite choledocholithiasis is seen. Pancreas: Significant fat stranding changes are seen around the pancreatic head and proximal body. There is mild fatty atrophy seen throughout the pancreas. The mesenteric inflammatory changes extends through the right pericolic gutter. There is normal pancreatic enhancement. No peripancreatic fluid collections or areas of necrosis are seen. Spleen: Normal in size without focal abnormality. Adrenals/Urinary Tract: Both  adrenal glands appear normal. The kidneys and collecting system appear normal  without evidence of urinary tract calculus or hydronephrosis. Bladder is unremarkable. Stomach/Bowel: The stomach is normal in appearance. There appears to be mild wall thickening involving the third portion of the duodenum, with surrounding fat stranding changes from the pancreas.The appendix is normal. Vascular/Lymphatic: There are no enlarged mesenteric, retroperitoneal, or pelvic lymph nodes. No significant vascular findings are present. Reproductive: The prostate is unremarkable. Other: No evidence of abdominal wall mass. Small fat containing inguinal hernias are noted. Musculoskeletal: No acute or significant osseous findings. IMPRESSION: 1. Findings consistent with acute pancreatitis involving the pancreatic head and midbody. Extensive inflammatory changes extending into the right pericolic gutter. No pancreatic pseudocyst or areas of definite necrosis. 2. Mild dilatation of the common bile duct measuring up to 1 cm which could be related to postsurgical dilation, no definite choledocholithiasis seen. 3. Mild wall thickening involving the third portion the duodenum, likely related to adjacent duodenitis. Electronically Signed   By: Jonna Clark M.D.   On: 05/30/2019 00:59   Mr 3d Recon At Scanner  Result Date: 05/30/2019 CLINICAL DATA:  Acute pancreatitis. EXAM: MRI ABDOMEN WITHOUT AND WITH CONTRAST (INCLUDING MRCP) TECHNIQUE: Multiplanar multisequence MR imaging of the abdomen was performed both before and after the administration of intravenous contrast. Heavily T2-weighted images of the biliary and pancreatic ducts were obtained, and three-dimensional MRCP images were rendered by post processing. CONTRAST:  7.15mL GADAVIST GADOBUTROL 1 MMOL/ML IV SOLN COMPARISON:  CT scan 05/30/2019 FINDINGS: Lower chest: The lung bases are grossly clear. Minimal streaky basilar atelectasis but no pleural effusions. No pericardial effusion.  Hepatobiliary: No focal hepatic lesions. There is mild intrahepatic biliary dilatation and moderate extrahepatic biliary dilatation. The common bile duct measures a maximum of 11.5 mm in the porta hepatis and 8.5 mm in the head of the pancreas. Cystic duct remnant noted. The gallbladder is surgically absent. No common bile duct stones are identified. No pancreatic head mass or ampullary lesion. Pancreas: As demonstrated on the CT scan there is diffuse inflammation in and around the pancreas consistent with acute pancreatitis. There is some fluid in the anterior pararenal spaces and in the right pericolic gutter. There is also scattered mesenteric edema and fluid. The pancreas demonstrates relatively normal enhancement pattern without findings suspicious for pancreatic necrosis. Normal caliber and course of the main pancreatic duct with very slight dilatation distally near the ampulla where it measures 3.2 mm. Spleen:  Normal size.  No focal lesions. Adrenals/Urinary Tract: The adrenal glands and kidneys are unremarkable. Stomach/Bowel: The stomach, duodenum, visualized small bowel and colon are grossly normal. Mild inflammation surrounding the posterior aspect of the stomach and the second and third portions of the duodenum. Vascular/Lymphatic: The aorta and branch vessels are normal. The major venous structures are patent. No mesenteric or retroperitoneal mass or adenopathy. Other:  No abdominal wall hernia or subcutaneous lesions. Musculoskeletal: No significant bony findings. Lower thoracic spine hemangiomas are noted IMPRESSION: 1. MR findings consistent with acute pancreatitis but no evidence of pancreatic necrosis or other complicating features. 2. Status post cholecystectomy with mild intrahepatic and moderate extrahepatic biliary dilatation, likely physiologic. No common bile duct stones, pancreatic head mass or ampullary lesion. Electronically Signed   By: Rudie Meyer M.D.   On: 05/30/2019 06:11   Mr  Abdomen Mrcp Vivien Rossetti Contast  Result Date: 05/30/2019 CLINICAL DATA:  Acute pancreatitis. EXAM: MRI ABDOMEN WITHOUT AND WITH CONTRAST (INCLUDING MRCP) TECHNIQUE: Multiplanar multisequence MR imaging of the abdomen was performed both before and after the administration of intravenous contrast. Heavily  T2-weighted images of the biliary and pancreatic ducts were obtained, and three-dimensional MRCP images were rendered by post processing. CONTRAST:  7.615mL GADAVIST GADOBUTROL 1 MMOL/ML IV SOLN COMPARISON:  CT scan 05/30/2019 FINDINGS: Lower chest: The lung bases are grossly clear. Minimal streaky basilar atelectasis but no pleural effusions. No pericardial effusion. Hepatobiliary: No focal hepatic lesions. There is mild intrahepatic biliary dilatation and moderate extrahepatic biliary dilatation. The common bile duct measures a maximum of 11.5 mm in the porta hepatis and 8.5 mm in the head of the pancreas. Cystic duct remnant noted. The gallbladder is surgically absent. No common bile duct stones are identified. No pancreatic head mass or ampullary lesion. Pancreas: As demonstrated on the CT scan there is diffuse inflammation in and around the pancreas consistent with acute pancreatitis. There is some fluid in the anterior pararenal spaces and in the right pericolic gutter. There is also scattered mesenteric edema and fluid. The pancreas demonstrates relatively normal enhancement pattern without findings suspicious for pancreatic necrosis. Normal caliber and course of the main pancreatic duct with very slight dilatation distally near the ampulla where it measures 3.2 mm. Spleen:  Normal size.  No focal lesions. Adrenals/Urinary Tract: The adrenal glands and kidneys are unremarkable. Stomach/Bowel: The stomach, duodenum, visualized small bowel and colon are grossly normal. Mild inflammation surrounding the posterior aspect of the stomach and the second and third portions of the duodenum. Vascular/Lymphatic: The aorta and  branch vessels are normal. The major venous structures are patent. No mesenteric or retroperitoneal mass or adenopathy. Other:  No abdominal wall hernia or subcutaneous lesions. Musculoskeletal: No significant bony findings. Lower thoracic spine hemangiomas are noted IMPRESSION: 1. MR findings consistent with acute pancreatitis but no evidence of pancreatic necrosis or other complicating features. 2. Status post cholecystectomy with mild intrahepatic and moderate extrahepatic biliary dilatation, likely physiologic. No common bile duct stones, pancreatic head mass or ampullary lesion. Electronically Signed   By: Rudie MeyerP.  Gallerani M.D.   On: 05/30/2019 06:11    Scheduled Meds: . ALPRAZolam  1 mg Oral QHS  . amLODipine  10 mg Oral Daily  . enoxaparin (LOVENOX) injection  40 mg Subcutaneous Q24H  . folic acid  1 mg Oral Daily  . LORazepam  0-4 mg Intravenous Q6H   Followed by  . [START ON 06/03/2019] LORazepam  0-4 mg Intravenous Q12H  . magnesium hydroxide  30 mL Oral Daily  . metoprolol tartrate  25 mg Oral BID  . multivitamin with minerals  1 tablet Oral Daily  . oxyCODONE  10 mg Oral Q12H  . pantoprazole (PROTONIX) IV  40 mg Intravenous Q12H  . phosphorus  500 mg Oral Q4H  . psyllium  1 packet Oral Daily  . thiamine  100 mg Oral Daily   Or  . thiamine  100 mg Intravenous Daily  . traZODone  100 mg Oral QHS   Continuous Infusions: . sodium chloride 250 mL (06/02/19 0609)  . piperacillin-tazobactam 3.375 g (06/02/19 0610)  . potassium chloride 10 mEq (06/02/19 1154)   PRN Meds:sodium chloride, acetaminophen, cyclobenzaprine, diphenhydrAMINE, HYDROmorphone (DILAUDID) injection, LORazepam **OR** LORazepam, ondansetron (ZOFRAN) IV, senna  Assessment/Plan:  Principal Problem:   Pancreatitis Active Problems:   Benign hypertension with CKD (chronic kidney disease), stage II   Hyperlipidemia  Acute Pancreatitis:  - MRCP IMPRESSION: Much improved  1. MR findings consistent with acute  pancreatitis but no evidence of pancreatic necrosis or other complicating features. 2. Status post cholecystectomy with mild intrahepatic and moderate extrahepatic biliary dilatation, likely physiologic.  No common bile duct stones, pancreatic head mass or ampullary lesion. Was n.p.o. tolerating clear liquid diet well; Advanced to low-fat low-cholesterol diet as tolerated His lipase was in 140s to 160s on admission; now 39 Lipid panel is also essentially normal with triglyceride being 96  Fluid PO now  Adequate pain management and nausea vomiting management Benadryl  iv q6h prn  GI consulted.  No intervention indicated at this time.  Recommended advance diet to low-fat diet as tolerated Appreciate GI recs   Leukocytosis: Improving Admission WBC was 21.8 gradually trending down to 15K -Blood culture and urine culture--so far no growth -Zosyn started on 05/01/2019.  We will continue that since his condition improving along with the white count -GI recommendation as above  Hypertension Improving Cont home amlodipine  po qday  He also has history of anxiety for which she was taking Xanax.  Which was not given to the patient as per patient because it was as it was started as as needed Added Lopressor 25 mg twice daily with parameter; continue  Anxiety Cont Xanax scheduled as home dose  Insomnia Cont Trazodone  po qhs prn   DVT Prophylaxis-   Lovenox - SCDs   AM Labs Ordered, also please review Full Orders  Family Communication: Admission, patients condition and plan of care including tests being ordered have been discussed with the patient  who indicate understanding and agree with the plan and Code Status.  Code Status:  FULL CODE per patient   LOS: 3 days   Rhonin Trott Harold Hedge

## 2019-06-02 NOTE — Consult Note (Signed)
[facility] Electrolyte Monitoring and Replacement   Recent Labs: Potassium (mmol/L)  Date Value  06/02/2019 2.9 (L)   Magnesium (mg/dL)  Date Value  06/02/2019 2.2   Calcium (mg/dL)  Date Value  06/02/2019 8.6 (L)   Albumin (g/dL)  Date Value  06/01/2019 3.4 (L)   Phosphorus (mg/dL)  Date Value  06/02/2019 2.0 (L)   Sodium (mmol/L)  Date Value  06/02/2019 133 (L)  08/04/2016 141   Corrected Ca: 8.96 mg/dL  Assessment: 53 y.o. male presents with epigastric abdominal pain, with CT revealing acute pancreatitis.  Sharp pain, nonradiating, 8/10, associated with nausea but no vomiting. He has a h/o ongoing alcohol abuse and is therefore at a high risk of re-feeding syndrome.  Goal of Therapy:  Electrolytes WNL  Plan:   MD ordered potassium with oral KCl 10 mEq x 6.   Will order Kphos tabs x 2.   Check phosphorous, magnesium and potassium daily until consistently WNL  Oswald Hillock ,PharmD, BCPS Clinical Pharmacist 06/02/2019 10:14 AM

## 2019-06-03 LAB — BASIC METABOLIC PANEL
Anion gap: 12 (ref 5–15)
BUN: 13 mg/dL (ref 6–20)
CO2: 25 mmol/L (ref 22–32)
Calcium: 8.8 mg/dL — ABNORMAL LOW (ref 8.9–10.3)
Chloride: 98 mmol/L (ref 98–111)
Creatinine, Ser: 0.93 mg/dL (ref 0.61–1.24)
GFR calc Af Amer: >60 mL/min (ref >60)
GFR calc non Af Amer: >60 mL/min (ref >60)
Glucose, Bld: 94 mg/dL (ref 70–99)
Potassium: 2.9 mmol/L — ABNORMAL LOW (ref 3.5–5.1)
Sodium: 135 mmol/L (ref 135–145)

## 2019-06-03 LAB — CBC WITH DIFFERENTIAL/PLATELET
Abs Immature Granulocytes: 0.38 10*3/uL — ABNORMAL HIGH (ref 0.00–0.07)
Basophils Absolute: 0.1 10*3/uL (ref 0.0–0.1)
Basophils Relative: 1 %
Eosinophils Absolute: 0.5 10*3/uL (ref 0.0–0.5)
Eosinophils Relative: 4 %
HCT: 36.3 % — ABNORMAL LOW (ref 39.0–52.0)
Hemoglobin: 13.4 g/dL (ref 13.0–17.0)
Immature Granulocytes: 3 %
Lymphocytes Relative: 7 %
Lymphs Abs: 0.9 10*3/uL (ref 0.7–4.0)
MCH: 30.1 pg (ref 26.0–34.0)
MCHC: 36.9 g/dL — ABNORMAL HIGH (ref 30.0–36.0)
MCV: 81.6 fL (ref 80.0–100.0)
Monocytes Absolute: 2.1 10*3/uL — ABNORMAL HIGH (ref 0.1–1.0)
Monocytes Relative: 16 %
Neutro Abs: 9.4 10*3/uL — ABNORMAL HIGH (ref 1.7–7.7)
Neutrophils Relative %: 69 %
Platelets: 296 10*3/uL (ref 150–400)
RBC: 4.45 MIL/uL (ref 4.22–5.81)
RDW: 12.7 % (ref 11.5–15.5)
WBC: 13.4 10*3/uL — ABNORMAL HIGH (ref 4.0–10.5)
nRBC: 0 % (ref 0.0–0.2)

## 2019-06-03 LAB — PHOSPHORUS: Phosphorus: 2.7 mg/dL (ref 2.5–4.6)

## 2019-06-03 MED ORDER — PROBIOTIC 250 MG PO CAPS: 1.0000 | ORAL_CAPSULE | Freq: Every day | ORAL | 0 refills | Status: DC

## 2019-06-03 MED ORDER — POTASSIUM CHLORIDE ER 10 MEQ PO TBCR: 20.0000 meq | EXTENDED_RELEASE_TABLET | Freq: Every day | ORAL | 0 refills | Status: DC

## 2019-06-03 MED ORDER — PSYLLIUM 95 % PO PACK: 1.0000 | PACK | Freq: Every day | ORAL | 0 refills | Status: DC

## 2019-06-03 MED ORDER — ACETAMINOPHEN 325 MG PO TABS: 650.0000 mg | ORAL_TABLET | Freq: Four times a day (QID) | ORAL | 0 refills | Status: AC | PRN

## 2019-06-03 MED ORDER — OMEPRAZOLE 20 MG PO CPDR: 20.0000 mg | DELAYED_RELEASE_CAPSULE | Freq: Every day | ORAL | 1 refills | Status: DC

## 2019-06-03 MED ORDER — POTASSIUM CHLORIDE CRYS ER 20 MEQ PO TBCR
40.0000 meq | EXTENDED_RELEASE_TABLET | Freq: Every day | ORAL | Status: DC
  Administered 2019-06-03: 40 meq via ORAL
  Filled 2019-06-03: qty 2

## 2019-06-03 MED ORDER — METOPROLOL TARTRATE 25 MG PO TABS: 25.0000 mg | ORAL_TABLET | Freq: Two times a day (BID) | ORAL | 0 refills | Status: DC

## 2019-06-03 MED ORDER — THIAMINE HCL 100 MG PO TABS: 100.0000 mg | ORAL_TABLET | Freq: Every day | ORAL | 0 refills | Status: DC

## 2019-06-03 MED ORDER — FOLIC ACID 1 MG PO TABS: 1.0000 mg | ORAL_TABLET | Freq: Every day | ORAL | 0 refills | Status: DC

## 2019-06-03 MED ORDER — ADULT MULTIVITAMIN W/MINERALS CH: 1.0000 | ORAL_TABLET | Freq: Every day | ORAL | 0 refills | Status: AC

## 2019-06-03 MED ORDER — MAGNESIUM HYDROXIDE 400 MG/5ML PO SUSP: 30.0000 mL | Freq: Every day | ORAL | 0 refills | Status: DC

## 2019-06-03 MED ORDER — POTASSIUM CHLORIDE 10 MEQ/100ML IV SOLN
10.0000 meq | INTRAVENOUS | Status: AC
  Administered 2019-06-03 (×4): 10 meq via INTRAVENOUS
  Filled 2019-06-03 (×2): qty 100

## 2019-06-03 MED ORDER — AMOXICILLIN-POT CLAVULANATE 875-125 MG PO TABS: 1.0000 | ORAL_TABLET | Freq: Two times a day (BID) | ORAL | 0 refills | Status: AC

## 2019-06-03 NOTE — Discharge Summary (Signed)
Physician Discharge Summary  Patient ID: Brett Huber MRN: [id number] DOB/AGE: 1966/03/30 53 y.o.  Admit date: 05/30/2019 Discharge date: 06/03/2019  Admission Diagnoses: Pancreatitis  Discharge Diagnoses:  Principal Problem:   Pancreatitis Active Problems:   Benign hypertension with CKD (chronic kidney disease), stage II   Hyperlipidemia   Discharged Condition: fair  Hospital Course:  Brief History:  Brett Huber a52 y.o.male,w hypertension, anxiety, recent R shoulder surgery apparently presents with c/o epigastric pain starting yesterday. Pt denies fever, chills, n/v, diarrhea, brbpr. Pt states that this pain felt like his prior pancreatitis, No recent medication changes. Admits Ethyl alcohol use.  Acute Pancreatitis: Resolved - MRCP ON ADMISSION IMPRESSION:   1. MR findings consistent with acute pancreatitis but no evidence of pancreatic necrosis or other complicating features. 2. Status post cholecystectomy with mild intrahepatic and moderate extrahepatic biliary dilatation, likely physiologic. No common bile duct stones, pancreatic head mass or ampullary lesion. Was n.p.o. on admission tolerating clear liquid diet well; Advanced to low-fat low-cholesterol diet as tolerated.  Patient tolerated diet well. His latest lipase came down to 39 Lipid panel is also essentially normal with triglyceride being 96  Adequate pain management   GI consulted.  No intervention indicated at this time.  Recommended advance diet to low-fat diet as tolerated Appreciate GI recs -Encouraged to follow-up with PCP in 1 to 2 weeks  Leukocytosis: Significant improvement to 13.4K Admission WBC was 21.8   -Blood culture and urine culture--so far no growth -Was receiving Zosyn 05/01/2019.    Patient improved significantly while on antibiotic.  Sent home with Augmentin DS for 4 more days -GI recommendation as above  Hypertension Improved to baseline Cont home amlodipine  10mg  po qday; also added Lopressor 25 mg twice daily.  Rx sent to his pharmacy  Anxiety Xanax scheduled as home dose as patient stated he takes it every night  Insomnia Cont Trazodone 100mg  po qhs prn  Alcoholism No withdrawal signs or symptoms while in hospital since admission Patient was counseled extensively on alcohol cessation permanently -Patient expressed understanding and stated he would work on it.  Did not want any resources.  Consults: GI  Significant Diagnostic Studies: MRI and other diagnostic studies  Treatments: As hospital course and discharge medication  Discharge Exam: Blood pressure 128/84, pulse 92, temperature 98.9 F (37.2 C), temperature source Oral, resp. rate 18, height 5\' 9"  (1.753 m), weight 78.5 kg, SpO2 97 %.  1. General: axoxo3  2. Psychiatric: euthymic  3. Neurologic: nonfocal  4. HEENMT: Anicteric, pupils 1.35mm symmetric, direct, consensual intact Neck: no jvd  5. Respiratory : CTAB  6. Cardiovascular : rrr s1, s2,  7. Gastrointestinal: Abd: soft,  nd, +bs; mild Tender to palpation at the epigastric region.  No guarding.  And no rebound tenderness.  No change in skin color on the front or back on the abdomen or lower back.  8. Skin: Ext: no c/c/e, no rash, negative cullens sign  9.Musculoskeletal: Good ROM  Disposition: Discharge disposition: 01-Home or Self Care     Patient was deemed stable to be discharged home with close follow-up with PCP and discharge instructions and medications.  Warning signs and symptoms when he must seek immediate medical attention was explained to patient and he expressed understanding.  Discharge Instructions    Call MD for:  difficulty breathing, headache or visual disturbances   Complete by: As directed    Call MD for:  severe uncontrolled pain   Complete by: As directed    Diet full  liquid   Complete by: As directed    Low-fat low-cholesterol soft diet for a week then  advance to low-fat low-cholesterol diet.   Increase activity slowly   Complete by: As directed      Allergies as of 06/03/2019      Reactions   Bee Venom Shortness Of Breath      Medication List    STOP taking these medications   naproxen 500 MG tablet Commonly known as: NAPROSYN     TAKE these medications   acetaminophen 325 MG tablet Commonly known as: TYLENOL Take 2 tablets (650 mg total) by mouth every 6 (six) hours as needed for mild pain, moderate pain or headache.   ALPRAZolam 1 MG tablet Commonly known as: XANAX Take 1 tablet (1 mg total) by mouth at bedtime as needed for anxiety or sleep.   amLODipine 10 MG tablet Commonly known as: NORVASC Take 1 tablet (10 mg total) by mouth daily.   amoxicillin-clavulanate 875-125 MG tablet Commonly known as: Augmentin Take 1 tablet by mouth 2 (two) times daily for 4 days.   cyclobenzaprine 10 MG tablet Commonly known as: FLEXERIL Take 1 tablet (10 mg total) by mouth at bedtime as needed for muscle spasms.   folic acid 1 MG tablet Commonly known as: FOLVITE Take 1 tablet (1 mg total) by mouth daily. Start taking on: June 04, 2019   magnesium hydroxide 400 MG/5ML suspension Commonly known as: MILK OF MAGNESIA Take 30 mLs by mouth daily. Start taking on: June 04, 2019   metoprolol tartrate 25 MG tablet Commonly known as: LOPRESSOR Take 1 tablet (25 mg total) by mouth 2 (two) times daily. Check BP and heart rate daily.  Hold if SBP less than 110 or DBP less than 55 or heart rate less than 60.   multivitamin with minerals Tabs tablet Take 1 tablet by mouth daily. Start taking on: June 04, 2019   omeprazole 20 MG capsule Commonly known as: PriLOSEC Take 1 capsule (20 mg total) by mouth daily.   oxyCODONE-acetaminophen 5-325 MG tablet Commonly known as: PERCOCET/ROXICET Take 1-2 tablets by mouth every 4 (four) hours as needed.   potassium chloride 10 MEQ tablet Commonly known as: KLOR-CON Take 2 tablets  (20 mEq total) by mouth daily.   Probiotic 250 MG Caps Take 1 capsule by mouth daily.   psyllium 95 % Pack Commonly known as: HYDROCIL/METAMUCIL Take 1 packet by mouth daily. Start taking on: June 04, 2019   thiamine 100 MG tablet Take 1 tablet (100 mg total) by mouth daily. Start taking on: June 04, 2019   tiZANidine 2 MG tablet Commonly known as: ZANAFLEX Take 2 mg by mouth every 8 (eight) hours as needed.   traZODone 100 MG tablet Commonly known as: DESYREL TAKE 1 TABLET BY MOUTH AT BEDTIME   triamcinolone ointment 0.5 % Commonly known as: KENALOG Apply 1 application topically 2 (two) times daily. 1-2 weeks, for hands.        Signed: Thomasenia Bottoms 06/03/2019, 12:42 PM

## 2019-06-03 NOTE — Progress Notes (Signed)
Patient verbalized feeling constipated. He has taken senokot for the last two days with no results. On call provider made aware and received verbal orders. See MAR.

## 2019-06-03 NOTE — Consult Note (Signed)
[facility] for Electrolyte Monitoring and Replacement   Recent Labs: Potassium (mmol/L)  Date Value  06/03/2019 2.9 (L)   Magnesium (mg/dL)  Date Value  06/02/2019 2.2   Calcium (mg/dL)  Date Value  06/03/2019 8.8 (L)   Albumin (g/dL)  Date Value  06/01/2019 3.4 (L)   Phosphorus (mg/dL)  Date Value  06/03/2019 2.7   Sodium (mmol/L)  Date Value  06/03/2019 135  08/04/2016 141   Corrected Ca: 8.96 mg/dL  Assessment: 53 y.o. male presents with epigastric abdominal pain, with CT revealing acute pancreatitis.  Sharp pain, nonradiating, 8/10, associated with nausea but no vomiting. He has a h/o ongoing alcohol abuse and is therefore at a high risk of re-feeding syndrome.  Goal of Therapy:  Electrolytes WNL  Plan:   Will ordered potassium with oral KCl 10 mEq x 4 and Daily KCl 40 mEq daily.   Check BMP with AM labs.   Oswald Hillock ,PharmD, BCPS Clinical Pharmacist 06/03/2019 8:28 AM

## 2019-06-03 NOTE — Plan of Care (Signed)

## 2019-06-04 ENCOUNTER — Telehealth: Payer: Self-pay

## 2019-06-04 NOTE — Telephone Encounter (Signed)
I have made the 1st attempt to contact the patient or family member in charge, in order to follow up from recently being discharged from the hospital. I left a message on voicemail but I will make another attempt at a different time.  

## 2019-06-05 LAB — CULTURE, BLOOD (ROUTINE X 2)
Culture: NO GROWTH
Culture: NO GROWTH
Special Requests: ADEQUATE
Special Requests: ADEQUATE

## 2019-06-05 NOTE — Telephone Encounter (Signed)
I have made the 2nd attempt to contact the patient or family member in charge, in order to follow up from recently being discharged from the hospital. I left a message on voicemail but I will make another attempt at a different time.  

## 2019-06-06 ENCOUNTER — Telehealth: Payer: Self-pay

## 2019-06-06 NOTE — Telephone Encounter (Signed)
Transition Care Management Follow-up Telephone Call  Date of discharge and from where: 06/03/2019 [facility]   How have you been since you were released from the hospital? "im doing okay, just taking it easy. I do keep getting some headaches. They dont last long and tylenol is helping when I do get them. I check my blood pressure when I get them as well and it has been in the normal range and I dont have any other problem associated with it"  Any questions or concerns? No   Items Reviewed:  Did the pt receive and understand the discharge instructions provided? Yes   Medications obtained and verified? Yes   Any new allergies since your discharge? No   Dietary orders reviewed? Yes  Do you have support at home? Yes   Functional Questionnaire: (I = Independent and D = Dependent) ADLs: i  Bathing/Dressing- i  Meal Prep- i  Eating- i  Maintaining continence- i  Transferring/Ambulation- i  Managing Meds- i  Follow up appointments reviewed:   PCP Hospital f/u appt confirmed? Yes  Scheduled to see Dr.Karmalegos on 06/02/2019 @ [facility] f/u appt confirmed? No    Are transportation arrangements needed? No   If their condition worsens, is the pt aware to call PCP or go to the Emergency Dept.? Yes  Was the patient provided with contact information for the PCP's office or ED? Yes  Was to pt encouraged to call back with questions or concerns? Yes

## 2019-06-06 NOTE — Telephone Encounter (Signed)
Patient called back regarding hospital follow up and left a message.  attempted to call patient back with no answer, left message.

## 2019-06-12 ENCOUNTER — Other Ambulatory Visit: Payer: Self-pay

## 2019-06-12 ENCOUNTER — Encounter: Payer: Self-pay | Admitting: Family Medicine

## 2019-06-12 ENCOUNTER — Ambulatory Visit (INDEPENDENT_AMBULATORY_CARE_PROVIDER_SITE_OTHER): Payer: No Typology Code available for payment source | Admitting: Family Medicine

## 2019-06-12 DIAGNOSIS — F1021 Alcohol dependence, in remission: Secondary | ICD-10-CM | POA: Diagnosis not present

## 2019-06-12 DIAGNOSIS — K852 Alcohol induced acute pancreatitis without necrosis or infection: Secondary | ICD-10-CM

## 2019-06-12 DIAGNOSIS — F1020 Alcohol dependence, uncomplicated: Secondary | ICD-10-CM | POA: Insufficient documentation

## 2019-06-12 MED ORDER — GABAPENTIN 100 MG PO CAPS: ORAL_CAPSULE | ORAL | 1 refills | Status: DC

## 2019-06-12 NOTE — Progress Notes (Signed)
Subjective:    Patient ID: Brett Huber, male    DOB: 1965-10-15, 53 y.o.   MRN: [id number]  Brett Huber is a 53 y.o. male presenting on 06/12/2019 for Hospitalization Follow-up (Pancreatitis)  Virtual / Telehealth Encounter - Telephone  The purpose of this virtual visit is to provide medical care while limiting exposure to the novel coronavirus (COVID19) for both patient and office staff.  Consent was obtained for remote visit:  Yes.   Answered questions that patient had about telehealth interaction:  Yes.   I discussed the limitations, risks, security and privacy concerns of performing an evaluation and management service by video/telephone. I also discussed with the patient that there may be a patient responsible charge related to this service. The patient expressed understanding and agreed to proceed.  Patient Location: Home Provider Location: [facility] (Office)   HPI  HOSPITAL FOLLOW-UP VISIT  Hospital/Location: [facility] Date of Admission: 05/30/19 Date of Discharge: 06/03/19 Transitions of care telephone call: Completed by Marin Roberts LPN 40/9/81  Reason for Admission: Abdominal Pain / Acute Pancreatitis Primary (+Secondary) Diagnosis: same  - Hospital H&P and Discharge Summary have been reviewed - Patient presents today 9 days after recent hospitalization. Brief summary of recent course, patient had symptoms of abdominal discomfort onset following R shoulder surgery rotator cuff surgery on 05/28/19, then later few days after surgery, his nerve block wore off, then he had significant acute pain symptoms in LUQ with pancreas pain, thought to be acute pancreatitis similar to episode he had years ago, s/p gallbladder with gallstones in past. He was hospitalized and treated with alcohol cessation and IVF rehydration and pain control. He had imaging with GI consultation and MRCP identified acute pancreatitis and no complication. Other labs and work up  done, completed IV Zosyn antibiotic, then transition to PO Augmentin 4 day on DC - also Elevated BP in hospital due to pain was started on metoprolol low dose 25 BID temporary  - Today reports overall has done well after discharge. Symptoms of abdominal pain has significantly improved. presumed due to alcohol, history of alcohol dependence, he was drinking more than he ever reported. - He was treated initially for some alcohol withdrawal in hospital, given Ativan - He has long history 40 years of alcohol, never successfully quit before. - Regarding recent shoulder surgery, significant pain after nerve block, patient prefers tylenol PRN, keeping him awake at times. Admits restless - cant get comfortable from shoulder and question alcohol dependence Takes Xanax still nightly for sleep insomnia   Denies active worsening abdominal pain, nausea vomiting Denies fever chills sweats body aches, tremors, weakness, near syncope, hallucinations   I have reviewed the discharge medication list, and have reconciled the current and discharge medications today.   Current Outpatient Medications:  .  acetaminophen (TYLENOL) 325 MG tablet, Take 2 tablets (650 mg total) by mouth every 6 (six) hours as needed for mild pain, moderate pain or headache., Disp: 20 tablet, Rfl: 0 .  ALPRAZolam (XANAX) 1 MG tablet, Take 1 tablet (1 mg total) by mouth at bedtime as needed for anxiety or sleep., Disp: 30 tablet, Rfl: 2 .  amLODipine (NORVASC) 10 MG tablet, Take 1 tablet (10 mg total) by mouth daily., Disp: 90 tablet, Rfl: 1 .  cyclobenzaprine (FLEXERIL) 10 MG tablet, Take 1 tablet (10 mg total) by mouth at bedtime as needed for muscle spasms., Disp: 30 tablet, Rfl: 0 .  folic acid (FOLVITE) 1 MG tablet, Take 1 tablet (1  mg total) by mouth daily., Disp: 30 tablet, Rfl: 0 .  magnesium hydroxide (MILK OF MAGNESIA) 400 MG/5ML suspension, Take 30 mLs by mouth daily., Disp: 355 mL, Rfl: 0 .  Multiple Vitamin (MULTIVITAMIN  WITH MINERALS) TABS tablet, Take 1 tablet by mouth daily., Disp: 30 tablet, Rfl: 0 .  omeprazole (PRILOSEC) 20 MG capsule, Take 1 capsule (20 mg total) by mouth daily., Disp: 30 capsule, Rfl: 1 .  psyllium (HYDROCIL/METAMUCIL) 95 % PACK, Take 1 packet by mouth daily., Disp: 240 each, Rfl: 0 .  Saccharomyces boulardii (PROBIOTIC) 250 MG CAPS, Take 1 capsule by mouth daily., Disp: 10 capsule, Rfl: 0 .  thiamine 100 MG tablet, Take 1 tablet (100 mg total) by mouth daily., Disp: 30 tablet, Rfl: 0 .  tiZANidine (ZANAFLEX) 2 MG tablet, Take 2 mg by mouth every 8 (eight) hours as needed., Disp: , Rfl:  .  traZODone (DESYREL) 100 MG tablet, TAKE 1 TABLET BY MOUTH AT BEDTIME, Disp: 90 tablet, Rfl: 0 .  triamcinolone ointment (KENALOG) 0.5 %, Apply 1 application topically 2 (two) times daily. 1-2 weeks, for hands., Disp: 30 g, Rfl: 2 .  gabapentin (NEURONTIN) 100 MG capsule, Start 1 capsule daily, increase by 1 cap every 2-3 days as tolerated up to 3 times a day, or may take 3 at once in evening., Disp: 90 capsule, Rfl: 1 .  potassium chloride (KLOR-CON) 10 MEQ tablet, Take 2 tablets (20 mEq total) by mouth daily. (Patient not taking: Reported on 06/12/2019), Disp: 30 tablet, Rfl: 0  ------------------------------------------------------------------------- Social History   Tobacco Use  . Smoking status: Never Smoker  . Smokeless tobacco: Current User    Types: Chew  Substance Use Topics  . Alcohol use: Yes    Comment: occ  . Drug use: No    Review of Systems Per HPI unless specifically indicated above     Objective:    There were no vitals taken for this visit.  Wt Readings from Last 3 Encounters:  06/01/19 173 lb (78.5 kg)  04/13/19 176 lb (79.8 kg)  03/08/18 179 lb (81.2 kg)    Physical Exam   No physical exam done. Virtual visit by phone.   CLINICAL DATA:  Acute pancreatitis.  EXAM: MRI ABDOMEN WITHOUT AND WITH CONTRAST (INCLUDING MRCP)  TECHNIQUE: Multiplanar multisequence  MR imaging of the abdomen was performed both before and after the administration of intravenous contrast. Heavily T2-weighted images of the biliary and pancreatic ducts were obtained, and three-dimensional MRCP images were rendered by post processing.  CONTRAST:  7.97mL GADAVIST GADOBUTROL 1 MMOL/ML IV SOLN  COMPARISON:  CT scan 05/30/2019  FINDINGS: Lower chest: The lung bases are grossly clear. Minimal streaky basilar atelectasis but no pleural effusions. No pericardial effusion.  Hepatobiliary: No focal hepatic lesions. There is mild intrahepatic biliary dilatation and moderate extrahepatic biliary dilatation. The common bile duct measures a maximum of 11.5 mm in the porta hepatis and 8.5 mm in the head of the pancreas. Cystic duct remnant noted. The gallbladder is surgically absent.  No common bile duct stones are identified. No pancreatic head mass or ampullary lesion.  Pancreas: As demonstrated on the CT scan there is diffuse inflammation in and around the pancreas consistent with acute pancreatitis. There is some fluid in the anterior pararenal spaces and in the right pericolic gutter. There is also scattered mesenteric edema and fluid. The pancreas demonstrates relatively normal enhancement pattern without findings suspicious for pancreatic necrosis.  Normal caliber and course of the main pancreatic duct  with very slight dilatation distally near the ampulla where it measures 3.2 mm.  Spleen:  Normal size.  No focal lesions.  Adrenals/Urinary Tract: The adrenal glands and kidneys are unremarkable.  Stomach/Bowel: The stomach, duodenum, visualized small bowel and colon are grossly normal. Mild inflammation surrounding the posterior aspect of the stomach and the second and third portions of the duodenum.  Vascular/Lymphatic: The aorta and branch vessels are normal. The major venous structures are patent. No mesenteric or retroperitoneal mass or  adenopathy.  Other:  No abdominal wall hernia or subcutaneous lesions.  Musculoskeletal: No significant bony findings. Lower thoracic spine hemangiomas are noted  IMPRESSION: 1. MR findings consistent with acute pancreatitis but no evidence of pancreatic necrosis or other complicating features. 2. Status post cholecystectomy with mild intrahepatic and moderate extrahepatic biliary dilatation, likely physiologic. No common bile duct stones, pancreatic head mass or ampullary lesion.   Electronically Signed   By: Rudie Meyer M.D.   On: 05/30/2019 06:11  Results for orders placed or performed during the hospital encounter of 05/30/19  SARS CORONAVIRUS 2 (TAT 6-24 HRS) Nasopharyngeal Nasopharyngeal Swab   Specimen: Nasopharyngeal Swab  Result Value Ref Range   SARS Coronavirus 2 NEGATIVE NEGATIVE  CULTURE, BLOOD (ROUTINE X 2) w Reflex to ID Panel   Specimen: BLOOD  Result Value Ref Range   Specimen Description BLOOD BLOOD LEFT HAND    Special Requests      BOTTLES DRAWN AEROBIC AND ANAEROBIC Blood Culture adequate volume   Culture      NO GROWTH 5 DAYS Performed at [facility], [address] [place], [place] [address]    Report Status 06/05/2019 FINAL   CULTURE, BLOOD (ROUTINE X 2) w Reflex to ID Panel   Specimen: BLOOD  Result Value Ref Range   Specimen Description BLOOD LEFT ANTECUBITAL    Special Requests      BOTTLES DRAWN AEROBIC AND ANAEROBIC Blood Culture adequate volume   Culture      NO GROWTH 5 DAYS Performed at [facility], [address] [place], [place] [address]    Report Status 06/05/2019 FINAL   Urine Culture   Specimen: Urine, Random  Result Value Ref Range   Specimen Description      URINE, RANDOM Performed at [facility], [address], [place], [place] [address]    Special Requests      NONE Performed at [facility], [address], [place], [place] [address]    Culture      NO  GROWTH Performed at [facility], [address], [place], [place] [address]    Report Status 06/01/2019 FINAL   Lipase, blood  Result Value Ref Range   Lipase 145 (H) 11 - 51 U/L  Comprehensive metabolic panel  Result Value Ref Range   Sodium 135 135 - 145 mmol/L   Potassium 3.5 3.5 - 5.1 mmol/L   Chloride 98 98 - 111 mmol/L   CO2 24 22 - 32 mmol/L   Glucose, Bld 144 (H) 70 - 99 mg/dL   BUN 12 6 - 20 mg/dL   Creatinine, Ser 6.57 0.61 - 1.24 mg/dL   Calcium 9.1 8.9 - 84.6 mg/dL   Total Protein 7.6 6.5 - 8.1 g/dL   Albumin 4.4 3.5 - 5.0 g/dL   AST 22 15 - 41 U/L   ALT 18 0 - 44 U/L   Alkaline Phosphatase 43 38 - 126 U/L   Total Bilirubin 1.7 (H) 0.3 -  1.2 mg/dL   GFR calc non Af Amer >60 >60 mL/min   GFR calc Af Amer >60 >60 mL/min   Anion gap 13 5 - 15  CBC  Result Value Ref Range   WBC 21.8 (H) 4.0 - 10.5 K/uL   RBC 5.19 4.22 - 5.81 MIL/uL   Hemoglobin 15.9 13.0 - 17.0 g/dL   HCT 56.3 87.5 - 64.3 %   MCV 83.2 80.0 - 100.0 fL   MCH 30.6 26.0 - 34.0 pg   MCHC 36.8 (H) 30.0 - 36.0 g/dL   RDW 32.9 51.8 - 84.1 %   Platelets 260 150 - 400 K/uL   nRBC 0.0 0.0 - 0.2 %  Urinalysis, Complete w Microscopic  Result Value Ref Range   Color, Urine YELLOW (A) YELLOW   APPearance HAZY (A) CLEAR   Specific Gravity, Urine 1.017 1.005 - 1.030   pH 7.0 5.0 - 8.0   Glucose, UA NEGATIVE NEGATIVE mg/dL   Hgb urine dipstick SMALL (A) NEGATIVE   Bilirubin Urine NEGATIVE NEGATIVE   Ketones, ur NEGATIVE NEGATIVE mg/dL   Protein, ur 30 (A) NEGATIVE mg/dL   Nitrite NEGATIVE NEGATIVE   Leukocytes,Ua NEGATIVE NEGATIVE   RBC / HPF 6-10 0 - 5 RBC/hpf   WBC, UA 0-5 0 - 5 WBC/hpf   Bacteria, UA RARE (A) NONE SEEN   Squamous Epithelial / LPF 0-5 0 - 5   Mucus PRESENT    Amorphous Crystal PRESENT   HIV Antibody (routine testing w rflx)  Result Value Ref Range   HIV Screen 4th Generation wRfx NON REACTIVE NON REACTIVE  Comprehensive metabolic panel  Result Value Ref Range   Sodium 137  135 - 145 mmol/L   Potassium 3.9 3.5 - 5.1 mmol/L   Chloride 102 98 - 111 mmol/L   CO2 27 22 - 32 mmol/L   Glucose, Bld 128 (H) 70 - 99 mg/dL   BUN 10 6 - 20 mg/dL   Creatinine, Ser 6.60 0.61 - 1.24 mg/dL   Calcium 8.7 (L) 8.9 - 10.3 mg/dL   Total Protein 6.8 6.5 - 8.1 g/dL   Albumin 3.9 3.5 - 5.0 g/dL   AST 31 15 - 41 U/L   ALT 24 0 - 44 U/L   Alkaline Phosphatase 44 38 - 126 U/L   Total Bilirubin 2.4 (H) 0.3 - 1.2 mg/dL   GFR calc non Af Amer >60 >60 mL/min   GFR calc Af Amer >60 >60 mL/min   Anion gap 8 5 - 15  CBC  Result Value Ref Range   WBC 21.7 (H) 4.0 - 10.5 K/uL   RBC 4.84 4.22 - 5.81 MIL/uL   Hemoglobin 14.6 13.0 - 17.0 g/dL   HCT 63.0 16.0 - 10.9 %   MCV 82.9 80.0 - 100.0 fL   MCH 30.2 26.0 - 34.0 pg   MCHC 36.4 (H) 30.0 - 36.0 g/dL   RDW 32.3 55.7 - 32.2 %   Platelets 228 150 - 400 K/uL   nRBC 0.0 0.0 - 0.2 %  Lipase, blood  Result Value Ref Range   Lipase 165 (H) 11 - 51 U/L  Lipid panel  Result Value Ref Range   Cholesterol 205 (H) 0 - 200 mg/dL   Triglycerides 96 <025 mg/dL   HDL 65 >42 mg/dL   Total CHOL/HDL Ratio 3.2 RATIO   VLDL 19 0 - 40 mg/dL   LDL Cholesterol 706 (H) 0 - 99 mg/dL  Lactate dehydrogenase  Result Value Ref Range  LDH 228 (H) 98 - 192 U/L  CBC with Differential/Platelet  Result Value Ref Range   WBC 21.8 (H) 4.0 - 10.5 K/uL   RBC 4.58 4.22 - 5.81 MIL/uL   Hemoglobin 13.9 13.0 - 17.0 g/dL   HCT 16.1 (L) 09.6 - 04.5 %   MCV 82.8 80.0 - 100.0 fL   MCH 30.3 26.0 - 34.0 pg   MCHC 36.7 (H) 30.0 - 36.0 g/dL   RDW 40.9 81.1 - 91.4 %   Platelets 204 150 - 400 K/uL   nRBC 0.0 0.0 - 0.2 %   Neutrophils Relative % 87 %   Neutro Abs 18.9 (H) 1.7 - 7.7 K/uL   Lymphocytes Relative 2 %   Lymphs Abs 0.5 (L) 0.7 - 4.0 K/uL   Monocytes Relative 10 %   Monocytes Absolute 2.1 (H) 0.1 - 1.0 K/uL   Eosinophils Relative 0 %   Eosinophils Absolute 0.1 0.0 - 0.5 K/uL   Basophils Relative 0 %   Basophils Absolute 0.0 0.0 - 0.1 K/uL   Immature  Granulocytes 1 %   Abs Immature Granulocytes 0.23 (H) 0.00 - 0.07 K/uL  Lipase, blood  Result Value Ref Range   Lipase 39 11 - 51 U/L  Comprehensive metabolic panel  Result Value Ref Range   Sodium 136 135 - 145 mmol/L   Potassium 3.6 3.5 - 5.1 mmol/L   Chloride 102 98 - 111 mmol/L   CO2 23 22 - 32 mmol/L   Glucose, Bld 121 (H) 70 - 99 mg/dL   BUN 10 6 - 20 mg/dL   Creatinine, Ser 7.82 0.61 - 1.24 mg/dL   Calcium 9.0 8.9 - 95.6 mg/dL   Total Protein 7.2 6.5 - 8.1 g/dL   Albumin 3.8 3.5 - 5.0 g/dL   AST 25 15 - 41 U/L   ALT 24 0 - 44 U/L   Alkaline Phosphatase 54 38 - 126 U/L   Total Bilirubin 2.6 (H) 0.3 - 1.2 mg/dL   GFR calc non Af Amer >60 >60 mL/min   GFR calc Af Amer >60 >60 mL/min   Anion gap 11 5 - 15  Bilirubin, direct  Result Value Ref Range   Bilirubin, Direct 0.7 (H) 0.0 - 0.2 mg/dL  CBC with Differential/Platelet  Result Value Ref Range   WBC 17.5 (H) 4.0 - 10.5 K/uL   RBC 4.23 4.22 - 5.81 MIL/uL   Hemoglobin 12.8 (L) 13.0 - 17.0 g/dL   HCT 21.3 (L) 08.6 - 57.8 %   MCV 85.1 80.0 - 100.0 fL   MCH 30.3 26.0 - 34.0 pg   MCHC 35.6 30.0 - 36.0 g/dL   RDW 46.9 62.9 - 52.8 %   Platelets 227 150 - 400 K/uL   nRBC 0.0 0.0 - 0.2 %   Neutrophils Relative % 84 %   Neutro Abs 14.7 (H) 1.7 - 7.7 K/uL   Lymphocytes Relative 3 %   Lymphs Abs 0.5 (L) 0.7 - 4.0 K/uL   Monocytes Relative 11 %   Monocytes Absolute 1.8 (H) 0.1 - 1.0 K/uL   Eosinophils Relative 1 %   Eosinophils Absolute 0.2 0.0 - 0.5 K/uL   Basophils Relative 0 %   Basophils Absolute 0.0 0.0 - 0.1 K/uL   Immature Granulocytes 1 %   Abs Immature Granulocytes 0.13 (H) 0.00 - 0.07 K/uL  Lipase, blood  Result Value Ref Range   Lipase 47 11 - 51 U/L  Basic metabolic panel  Result Value Ref Range  Sodium 138 135 - 145 mmol/L   Potassium 3.3 (L) 3.5 - 5.1 mmol/L   Chloride 103 98 - 111 mmol/L   CO2 25 22 - 32 mmol/L   Glucose, Bld 130 (H) 70 - 99 mg/dL   BUN 12 6 - 20 mg/dL   Creatinine, Ser 9.38 0.61 -  1.24 mg/dL   Calcium 8.8 (L) 8.9 - 10.3 mg/dL   GFR calc non Af Amer >60 >60 mL/min   GFR calc Af Amer >60 >60 mL/min   Anion gap 10 5 - 15  CBC with Differential/Platelet  Result Value Ref Range   WBC 15.9 (H) 4.0 - 10.5 K/uL   RBC 4.45 4.22 - 5.81 MIL/uL   Hemoglobin 13.5 13.0 - 17.0 g/dL   HCT 18.2 (L) 99.3 - 71.6 %   MCV 81.3 80.0 - 100.0 fL   MCH 30.3 26.0 - 34.0 pg   MCHC 37.3 (H) 30.0 - 36.0 g/dL   RDW 96.7 89.3 - 81.0 %   Platelets 276 150 - 400 K/uL   nRBC 0.0 0.0 - 0.2 %   Neutrophils Relative % 79 %   Neutro Abs 12.6 (H) 1.7 - 7.7 K/uL   Lymphocytes Relative 4 %   Lymphs Abs 0.7 0.7 - 4.0 K/uL   Monocytes Relative 13 %   Monocytes Absolute 2.1 (H) 0.1 - 1.0 K/uL   Eosinophils Relative 3 %   Eosinophils Absolute 0.4 0.0 - 0.5 K/uL   Basophils Relative 0 %   Basophils Absolute 0.1 0.0 - 0.1 K/uL   Immature Granulocytes 1 %   Abs Immature Granulocytes 0.14 (H) 0.00 - 0.07 K/uL  Magnesium  Result Value Ref Range   Magnesium 2.2 1.7 - 2.4 mg/dL  Comprehensive metabolic panel  Result Value Ref Range   Sodium 134 (L) 135 - 145 mmol/L   Potassium 3.1 (L) 3.5 - 5.1 mmol/L   Chloride 99 98 - 111 mmol/L   CO2 24 22 - 32 mmol/L   Glucose, Bld 136 (H) 70 - 99 mg/dL   BUN 11 6 - 20 mg/dL   Creatinine, Ser 1.75 0.61 - 1.24 mg/dL   Calcium 8.6 (L) 8.9 - 10.3 mg/dL   Total Protein 6.9 6.5 - 8.1 g/dL   Albumin 3.4 (L) 3.5 - 5.0 g/dL   AST 19 15 - 41 U/L   ALT 22 0 - 44 U/L   Alkaline Phosphatase 59 38 - 126 U/L   Total Bilirubin 2.0 (H) 0.3 - 1.2 mg/dL   GFR calc non Af Amer >60 >60 mL/min   GFR calc Af Amer >60 >60 mL/min   Anion gap 11 5 - 15  Magnesium  Result Value Ref Range   Magnesium 2.1 1.7 - 2.4 mg/dL  Phosphorus  Result Value Ref Range   Phosphorus 1.5 (L) 2.5 - 4.6 mg/dL  CBC  Result Value Ref Range   WBC 16.2 (H) 4.0 - 10.5 K/uL   RBC 4.33 4.22 - 5.81 MIL/uL   Hemoglobin 13.1 13.0 - 17.0 g/dL   HCT 10.2 (L) 58.5 - 27.7 %   MCV 83.8 80.0 - 100.0 fL     MCH 30.3 26.0 - 34.0 pg   MCHC 36.1 (H) 30.0 - 36.0 g/dL   RDW 82.4 23.5 - 36.1 %   Platelets 256 150 - 400 K/uL   nRBC 0.0 0.0 - 0.2 %  Lipase, blood  Result Value Ref Range   Lipase 45 11 - 51 U/L  Phosphorus  Result Value  Ref Range   Phosphorus 2.0 (L) 2.5 - 4.6 mg/dL  Basic metabolic panel  Result Value Ref Range   Sodium 133 (L) 135 - 145 mmol/L   Potassium 2.9 (L) 3.5 - 5.1 mmol/L   Chloride 98 98 - 111 mmol/L   CO2 23 22 - 32 mmol/L   Glucose, Bld 103 (H) 70 - 99 mg/dL   BUN 10 6 - 20 mg/dL   Creatinine, Ser 1.61 0.61 - 1.24 mg/dL   Calcium 8.6 (L) 8.9 - 10.3 mg/dL   GFR calc non Af Amer >60 >60 mL/min   GFR calc Af Amer >60 >60 mL/min   Anion gap 12 5 - 15  CBC with Differential/Platelet  Result Value Ref Range   WBC 13.4 (H) 4.0 - 10.5 K/uL   RBC 4.45 4.22 - 5.81 MIL/uL   Hemoglobin 13.4 13.0 - 17.0 g/dL   HCT 09.6 (L) 04.5 - 40.9 %   MCV 81.6 80.0 - 100.0 fL   MCH 30.1 26.0 - 34.0 pg   MCHC 36.9 (H) 30.0 - 36.0 g/dL   RDW 81.1 91.4 - 78.2 %   Platelets 296 150 - 400 K/uL   nRBC 0.0 0.0 - 0.2 %   Neutrophils Relative % 69 %   Neutro Abs 9.4 (H) 1.7 - 7.7 K/uL   Lymphocytes Relative 7 %   Lymphs Abs 0.9 0.7 - 4.0 K/uL   Monocytes Relative 16 %   Monocytes Absolute 2.1 (H) 0.1 - 1.0 K/uL   Eosinophils Relative 4 %   Eosinophils Absolute 0.5 0.0 - 0.5 K/uL   Basophils Relative 1 %   Basophils Absolute 0.1 0.0 - 0.1 K/uL   Immature Granulocytes 3 %   Abs Immature Granulocytes 0.38 (H) 0.00 - 0.07 K/uL  Basic metabolic panel  Result Value Ref Range   Sodium 135 135 - 145 mmol/L   Potassium 2.9 (L) 3.5 - 5.1 mmol/L   Chloride 98 98 - 111 mmol/L   CO2 25 22 - 32 mmol/L   Glucose, Bld 94 70 - 99 mg/dL   BUN 13 6 - 20 mg/dL   Creatinine, Ser 9.56 0.61 - 1.24 mg/dL   Calcium 8.8 (L) 8.9 - 10.3 mg/dL   GFR calc non Af Amer >60 >60 mL/min   GFR calc Af Amer >60 >60 mL/min   Anion gap 12 5 - 15  Phosphorus  Result Value Ref Range   Phosphorus 2.7 2.5 -  4.6 mg/dL      Assessment & Plan:   Problem List Items Addressed This Visit    Pancreatitis - Primary   Relevant Medications   gabapentin (NEURONTIN) 100 MG capsule   Alcohol dependence in early full remission (HCC)   Relevant Medications   gabapentin (NEURONTIN) 100 MG capsule      Clinically resolved acute pancreatitis, secondary to alcohol intake likely No complication on MRCP imaging in hospital Tolerating adv diet now, regular PO nearly Now abstain from alcohol in early remission x 2 weeks, he is motivated to remain alcohol free Concern mild withdrawal dependence symptoms, have improved actually but at risk of some persistent symptom, has nightly BDZ for insomnia  Plan Offere Gabapentin dose titration for any residual alcohol dependence symptoms, pain or help sleep as well Continue other current therapy, no other changes today Reviewed risk of pancreatitis recurrence  Follow-up if any other worsening or concerns.  Meds ordered this encounter  Medications  . gabapentin (NEURONTIN) 100 MG capsule    Sig: Start  1 capsule daily, increase by 1 cap every 2-3 days as tolerated up to 3 times a day, or may take 3 at once in evening.    Dispense:  90 capsule    Refill:  1    Follow up plan: Return in about 6 months (around 12/11/2019) for 6 month follow-up Insomnia, med refills.   Saralyn [name] Milayna Rotenberg, DO [facility] 06/12/2019, 4:27 PM

## 2019-06-12 NOTE — Patient Instructions (Addendum)
For pain, nervousness, and help sleep as well  Start Gabapentin 100mg  capsules, take at night for 2-3 nights only, and then increase to 2 times a day for a few days, and then may increase to 3 times a day, it may make you drowsy, if helps significantly at night only, then you can increase instead to 3 capsules at night, instead of 3 times a day - In the future if needed, we can significantly increase the dose if tolerated well, some common doses are 300mg  three times a day up to 600mg  three times a day, usually it takes several weeks or months to get to higher doses  We may only try this med for several weeks to month - then can phase off of it if you no longer need it.  If BP is too low as they advised, HOLD Metoprolol. If you are not needing it much then can DISCONTINUE It in near future, it was intended to only be temporary. We do not need to add it long term most likely.  Keep other meds and vitamins and minerals as you said.  Congratulations on quitting alcohol. Keep up the good work to maintain alcohol free.   Please schedule a Follow-up Appointment to: Return in about 6 months (around 12/11/2019) for 6 month follow-up Insomnia, med refills.  If you have any other questions or concerns, please feel free to call the office or send a message through [place]. You may also schedule an earlier appointment if necessary.  Additionally, you may be receiving a survey about your experience at our office within a few days to 1 week by e-mail or mail. We value your feedback.  Brett Putnam, DO [place]

## 2019-07-11 ENCOUNTER — Other Ambulatory Visit: Payer: Self-pay | Admitting: Nurse Practitioner

## 2019-07-11 DIAGNOSIS — F5101 Primary insomnia: Secondary | ICD-10-CM

## 2019-08-10 ENCOUNTER — Other Ambulatory Visit: Payer: Self-pay | Admitting: Family Medicine

## 2019-08-10 DIAGNOSIS — F5101 Primary insomnia: Secondary | ICD-10-CM

## 2019-08-10 DIAGNOSIS — I10 Essential (primary) hypertension: Secondary | ICD-10-CM

## 2019-11-13 ENCOUNTER — Other Ambulatory Visit: Payer: Self-pay | Admitting: Family Medicine

## 2019-11-13 DIAGNOSIS — I10 Essential (primary) hypertension: Secondary | ICD-10-CM

## 2019-11-19 ENCOUNTER — Other Ambulatory Visit: Payer: Self-pay | Admitting: Family Medicine

## 2019-11-19 DIAGNOSIS — F5101 Primary insomnia: Secondary | ICD-10-CM

## 2019-11-19 NOTE — Telephone Encounter (Signed)
Requested medications are due for refill today? Yes - This medication cannot be delegated.    Requested medications are on active medication list?  Yes  Last Refill:  08/10/2019  # 30 with 2 refills  Future visit scheduled?  No   Notes to Clinic:  This medication cannot be delegated.

## 2020-01-07 ENCOUNTER — Other Ambulatory Visit: Payer: Self-pay | Admitting: Family Medicine

## 2020-01-07 DIAGNOSIS — F5101 Primary insomnia: Secondary | ICD-10-CM

## 2020-01-07 NOTE — Telephone Encounter (Signed)
Attempted to call patient to schedule medication follow up- left message to call office. Courtesy RF #[phone] given 

## 2020-03-04 ENCOUNTER — Other Ambulatory Visit: Payer: Self-pay | Admitting: Family Medicine

## 2020-03-04 ENCOUNTER — Other Ambulatory Visit: Payer: Self-pay

## 2020-03-04 ENCOUNTER — Encounter: Payer: Self-pay | Admitting: Family Medicine

## 2020-03-04 ENCOUNTER — Ambulatory Visit: Payer: No Typology Code available for payment source | Admitting: Family Medicine

## 2020-03-04 VITALS — BP 140/91 | HR 81 | Temp 98.0°F | Resp 16 | Ht 69.0 in | Wt 172.0 lb

## 2020-03-04 DIAGNOSIS — F1021 Alcohol dependence, in remission: Secondary | ICD-10-CM

## 2020-03-04 DIAGNOSIS — F5101 Primary insomnia: Secondary | ICD-10-CM

## 2020-03-04 DIAGNOSIS — E559 Vitamin D deficiency, unspecified: Secondary | ICD-10-CM

## 2020-03-04 DIAGNOSIS — Z23 Encounter for immunization: Secondary | ICD-10-CM | POA: Diagnosis not present

## 2020-03-04 DIAGNOSIS — I129 Hypertensive chronic kidney disease with stage 1 through stage 4 chronic kidney disease, or unspecified chronic kidney disease: Secondary | ICD-10-CM

## 2020-03-04 DIAGNOSIS — N182 Chronic kidney disease, stage 2 (mild): Secondary | ICD-10-CM

## 2020-03-04 DIAGNOSIS — F419 Anxiety disorder, unspecified: Secondary | ICD-10-CM

## 2020-03-04 DIAGNOSIS — I10 Essential (primary) hypertension: Secondary | ICD-10-CM

## 2020-03-04 DIAGNOSIS — E782 Mixed hyperlipidemia: Secondary | ICD-10-CM

## 2020-03-04 DIAGNOSIS — R7309 Other abnormal glucose: Secondary | ICD-10-CM

## 2020-03-04 DIAGNOSIS — N400 Enlarged prostate without lower urinary tract symptoms: Secondary | ICD-10-CM

## 2020-03-04 DIAGNOSIS — F3342 Major depressive disorder, recurrent, in full remission: Secondary | ICD-10-CM

## 2020-03-04 DIAGNOSIS — Z0000 Encounter for general adult medical examination without abnormal findings: Secondary | ICD-10-CM

## 2020-03-04 MED ORDER — ALPRAZOLAM 1 MG PO TABS: 1.0000 mg | ORAL_TABLET | Freq: Every evening | ORAL | 2 refills | Status: DC | PRN

## 2020-03-04 MED ORDER — AMLODIPINE BESYLATE 10 MG PO TABS: 10.0000 mg | ORAL_TABLET | Freq: Every day | ORAL | 11 refills | Status: DC

## 2020-03-04 MED ORDER — TRAZODONE HCL 100 MG PO TABS: 100.0000 mg | ORAL_TABLET | Freq: Every day | ORAL | 11 refills | Status: DC

## 2020-03-04 NOTE — Assessment & Plan Note (Signed)
Controlled on Alprazolam Secondary w/ history of depression, insomnia 

## 2020-03-04 NOTE — Progress Notes (Signed)
Subjective:    Patient ID: Brett Huber, male    DOB: September 19, 1965, 54 y.o.   MRN: [id number]  Brett Huber is a 54 y.o. male presenting on 03/04/2020 for Hypertension (patient had not taken B/P meds from week needed refill)   HPI   CHRONIC HTN CKD II Reportshome BP readingshave remained controlled on meds, now ran out 1 week, needs refill Current Meds -Amlodipine 10mg daily Reports good compliance, took meds today. Tolerating well, w/o complaints. Denies CP, dyspnea, HA, edema, dizziness / lightheadedness  Contact Dermatitis, bilateral hands Improved on topical steroid PRN - does not need refill now. improved  INSOMNIA, Chronic Anxiety Major Depression recurrent in remission See prior notes, has been stable on current med management now. He had been doing very well. Denies depression. Anxiety has been controlled. Sleep has improved on medicines, no changes lately until past 1 week ran out of med, some poor sleep without trazodone, still has 5 pills left of alprazolam. - taking Trazodone 100mg nightly - Taking Xanax 1mg = one whole 1mg pill 4-5 days a week before bed for insomnia on work days, and occasionally takes a half pill for a few nights before bed if weekend   Health Maintenance: UTD COVID vaccine, will bring [place] copy of card next visit. Due for Flu Shot, will receive today    Depression screen [facility] 2/9 03/04/2020 04/13/2019 04/05/2019  Decreased Interest 0 0 1  Down, Depressed, Hopeless 0 0 1  PHQ - 2 Score 0 0 2  Altered sleeping 0 0 0  Tired, decreased energy 0 0 0  Change in appetite 0 0 0  Feeling bad or failure about yourself  0 0 0  Trouble concentrating 0 0 0  Moving slowly or fidgety/restless 0 0 0  Suicidal thoughts 0 0 0  PHQ-9 Score 0 0 2  Difficult doing work/chores Not difficult at all Not difficult at all Not difficult at all  Some recent data might be hidden   GAD 7 : Generalized Anxiety Score 03/04/2020 04/13/2019 04/05/2019 10/06/2018    Nervous, Anxious, on Edge 0 0 0 1  Control/stop worrying 0 0 0 0  Worry too much - different things 0 0 1 0  Trouble relaxing 0 0 0 0  Restless 0 0 0 0  Easily annoyed or irritable 0 0 0 0  Afraid - awful might happen 0 0 1 1  Total GAD 7 Score 0 0 2 2  Anxiety Difficulty Not difficult at all Not difficult at all Not difficult at all Not difficult at all      Social History   Tobacco Use  . Smoking status: Never Smoker  . Smokeless tobacco: Current User    Types: Chew  Substance Use Topics  . Alcohol use: Yes    Comment: occ  . Drug use: No    Review of Systems Per HPI unless specifically indicated above     Objective:    BP (!) 140/91   Pulse 81   Temp 98 F (36.7 C) (Temporal)   Resp 16   Ht 5\' 9"  (1.753 m)   Wt 172 lb (78 kg)   SpO2 96%   BMI 25.40 kg/m   Wt Readings from Last 3 Encounters:  03/04/20 172 lb (78 kg)  06/01/19 173 lb (78.5 kg)  04/13/19 176 lb (79.8 kg)    Physical Exam Vitals and nursing note reviewed.  Constitutional:      General: He is not in acute distress.  Appearance: He is well-developed. He is not diaphoretic.     Comments: Well-appearing, comfortable, cooperative  HENT:     Head: Normocephalic and atraumatic.  Eyes:     General:        Right eye: No discharge.        Left eye: No discharge.     Conjunctiva/sclera: Conjunctivae normal.  Cardiovascular:     Rate and Rhythm: Normal rate.  Pulmonary:     Effort: Pulmonary effort is normal.  Skin:    General: Skin is warm and dry.     Findings: No erythema or rash.  Neurological:     Mental Status: He is alert and oriented to person, place, and time.  Psychiatric:        Behavior: Behavior normal.     Comments: Well groomed, good eye contact, normal speech and thoughts        Results for orders placed or performed during the hospital encounter of 05/30/19  SARS CORONAVIRUS 2 (TAT 6-24 HRS) Nasopharyngeal Nasopharyngeal Swab   Specimen: Nasopharyngeal Swab   Result Value Ref Range   SARS Coronavirus 2 NEGATIVE NEGATIVE  CULTURE, BLOOD (ROUTINE X 2) w Reflex to ID Panel   Specimen: BLOOD  Result Value Ref Range   Specimen Description BLOOD BLOOD LEFT HAND    Special Requests      BOTTLES DRAWN AEROBIC AND ANAEROBIC Blood Culture adequate volume   Culture      NO GROWTH 5 DAYS Performed at [facility], [address] [place], [place] [address]    Report Status 06/05/2019 FINAL   CULTURE, BLOOD (ROUTINE X 2) w Reflex to ID Panel   Specimen: BLOOD  Result Value Ref Range   Specimen Description BLOOD LEFT ANTECUBITAL    Special Requests      BOTTLES DRAWN AEROBIC AND ANAEROBIC Blood Culture adequate volume   Culture      NO GROWTH 5 DAYS Performed at [facility], [address], [place], [place] [address]    Report Status 06/05/2019 FINAL   Urine Culture   Specimen: Urine, Random  Result Value Ref Range   Specimen Description      URINE, RANDOM Performed at [facility], [address], [place], [place] [address]    Special Requests      NONE Performed at [facility], [address], [place], [place] [address]    Culture      NO GROWTH Performed at [facility], [address] [place] [address], [place], [place] [address]    Report Status 06/01/2019 FINAL   Lipase, blood  Result Value Ref Range   Lipase 145 (H) 11 - 51 U/L  Comprehensive metabolic panel  Result Value Ref Range   Sodium 135 135 - 145 mmol/L   Potassium 3.5 3.5 - 5.1 mmol/L   Chloride 98 98 - 111 mmol/L   CO2 24 22 - 32 mmol/L   Glucose, Bld 144 (H) 70 - 99 mg/dL   BUN 12 6 - 20 mg/dL   Creatinine, Ser 1.30 0.61 - 1.24 mg/dL   Calcium 9.1 8.9 - 86.5 mg/dL   Total Protein 7.6 6.5 - 8.1 g/dL   Albumin 4.4 3.5 - 5.0 g/dL   AST 22 15 - 41 U/L   ALT 18 0 - 44 U/L   Alkaline Phosphatase 43 38 - 126 U/L   Total Bilirubin 1.7 (H) 0.3 - 1.2 mg/dL   GFR calc non Af Amer >60 >60 mL/min  GFR calc Af Amer >60 >60 mL/min   Anion  gap 13 5 - 15  CBC  Result Value Ref Range   WBC 21.8 (H) 4.0 - 10.5 K/uL   RBC 5.19 4.22 - 5.81 MIL/uL   Hemoglobin 15.9 13.0 - 17.0 g/dL   HCT 40.943.2 39 - 52 %   MCV 83.2 80.0 - 100.0 fL   MCH 30.6 26.0 - 34.0 pg   MCHC 36.8 (H) 30.0 - 36.0 g/dL   RDW 81.112.8 91.411.5 - 78.215.5 %   Platelets 260 150 - 400 K/uL   nRBC 0.0 0.0 - 0.2 %  Urinalysis, Complete w Microscopic  Result Value Ref Range   Color, Urine YELLOW (A) YELLOW   APPearance HAZY (A) CLEAR   Specific Gravity, Urine 1.017 1.005 - 1.030   pH 7.0 5.0 - 8.0   Glucose, UA NEGATIVE NEGATIVE mg/dL   Hgb urine dipstick SMALL (A) NEGATIVE   Bilirubin Urine NEGATIVE NEGATIVE   Ketones, ur NEGATIVE NEGATIVE mg/dL   Protein, ur 30 (A) NEGATIVE mg/dL   Nitrite NEGATIVE NEGATIVE   Leukocytes,Ua NEGATIVE NEGATIVE   RBC / HPF 6-10 0 - 5 RBC/hpf   WBC, UA 0-5 0 - 5 WBC/hpf   Bacteria, UA RARE (A) NONE SEEN   Squamous Epithelial / LPF 0-5 0 - 5   Mucus PRESENT    Amorphous Crystal PRESENT   HIV Antibody (routine testing w rflx)  Result Value Ref Range   HIV Screen 4th Generation wRfx NON REACTIVE NON REACTIVE  Comprehensive metabolic panel  Result Value Ref Range   Sodium 137 135 - 145 mmol/L   Potassium 3.9 3.5 - 5.1 mmol/L   Chloride 102 98 - 111 mmol/L   CO2 27 22 - 32 mmol/L   Glucose, Bld 128 (H) 70 - 99 mg/dL   BUN 10 6 - 20 mg/dL   Creatinine, Ser 9.560.92 0.61 - 1.24 mg/dL   Calcium 8.7 (L) 8.9 - 10.3 mg/dL   Total Protein 6.8 6.5 - 8.1 g/dL   Albumin 3.9 3.5 - 5.0 g/dL   AST 31 15 - 41 U/L   ALT 24 0 - 44 U/L   Alkaline Phosphatase 44 38 - 126 U/L   Total Bilirubin 2.4 (H) 0.3 - 1.2 mg/dL   GFR calc non Af Amer >60 >60 mL/min   GFR calc Af Amer >60 >60 mL/min   Anion gap 8 5 - 15  CBC  Result Value Ref Range   WBC 21.7 (H) 4.0 - 10.5 K/uL   RBC 4.84 4.22 - 5.81 MIL/uL   Hemoglobin 14.6 13.0 - 17.0 g/dL   HCT 21.340.1 39 - 52 %   MCV 82.9 80.0 - 100.0 fL   MCH 30.2 26.0 - 34.0 pg   MCHC 36.4 (H) 30.0 - 36.0 g/dL   RDW  08.613.2 57.811.5 - 46.915.5 %   Platelets 228 150 - 400 K/uL   nRBC 0.0 0.0 - 0.2 %  Lipase, blood  Result Value Ref Range   Lipase 165 (H) 11 - 51 U/L  Lipid panel  Result Value Ref Range   Cholesterol 205 (H) 0 - 200 mg/dL   Triglycerides 96 <629<150 mg/dL   HDL 65 >52>40 mg/dL   Total CHOL/HDL Ratio 3.2 RATIO   VLDL 19 0 - 40 mg/dL   LDL Cholesterol 841121 (H) 0 - 99 mg/dL  Lactate dehydrogenase  Result Value Ref Range   LDH 228 (H) 98 - 192 U/L  CBC with Differential/Platelet  Result Value Ref Range   WBC 21.8 (H) 4.0 - 10.5 K/uL   RBC 4.58 4.22 - 5.81 MIL/uL   Hemoglobin 13.9 13.0 - 17.0 g/dL   HCT 18.5 (L) 39 - 52 %   MCV 82.8 80.0 - 100.0 fL   MCH 30.3 26.0 - 34.0 pg   MCHC 36.7 (H) 30.0 - 36.0 g/dL   RDW 63.1 49.7 - 02.6 %   Platelets 204 150 - 400 K/uL   nRBC 0.0 0.0 - 0.2 %   Neutrophils Relative % 87 %   Neutro Abs 18.9 (H) 1.7 - 7.7 K/uL   Lymphocytes Relative 2 %   Lymphs Abs 0.5 (L) 0.7 - 4.0 K/uL   Monocytes Relative 10 %   Monocytes Absolute 2.1 (H) 0 - 1 K/uL   Eosinophils Relative 0 %   Eosinophils Absolute 0.1 0 - 0 K/uL   Basophils Relative 0 %   Basophils Absolute 0.0 0 - 0 K/uL   Immature Granulocytes 1 %   Abs Immature Granulocytes 0.23 (H) 0.00 - 0.07 K/uL  Lipase, blood  Result Value Ref Range   Lipase 39 11 - 51 U/L  Comprehensive metabolic panel  Result Value Ref Range   Sodium 136 135 - 145 mmol/L   Potassium 3.6 3.5 - 5.1 mmol/L   Chloride 102 98 - 111 mmol/L   CO2 23 22 - 32 mmol/L   Glucose, Bld 121 (H) 70 - 99 mg/dL   BUN 10 6 - 20 mg/dL   Creatinine, Ser 3.78 0.61 - 1.24 mg/dL   Calcium 9.0 8.9 - 58.8 mg/dL   Total Protein 7.2 6.5 - 8.1 g/dL   Albumin 3.8 3.5 - 5.0 g/dL   AST 25 15 - 41 U/L   ALT 24 0 - 44 U/L   Alkaline Phosphatase 54 38 - 126 U/L   Total Bilirubin 2.6 (H) 0.3 - 1.2 mg/dL   GFR calc non Af Amer >60 >60 mL/min   GFR calc Af Amer >60 >60 mL/min   Anion gap 11 5 - 15  Bilirubin, direct  Result Value Ref Range   Bilirubin,  Direct 0.7 (H) 0.0 - 0.2 mg/dL  CBC with Differential/Platelet  Result Value Ref Range   WBC 17.5 (H) 4.0 - 10.5 K/uL   RBC 4.23 4.22 - 5.81 MIL/uL   Hemoglobin 12.8 (L) 13.0 - 17.0 g/dL   HCT 50.2 (L) 39 - 52 %   MCV 85.1 80.0 - 100.0 fL   MCH 30.3 26.0 - 34.0 pg   MCHC 35.6 30.0 - 36.0 g/dL   RDW 77.4 12.8 - 78.6 %   Platelets 227 150 - 400 K/uL   nRBC 0.0 0.0 - 0.2 %   Neutrophils Relative % 84 %   Neutro Abs 14.7 (H) 1.7 - 7.7 K/uL   Lymphocytes Relative 3 %   Lymphs Abs 0.5 (L) 0.7 - 4.0 K/uL   Monocytes Relative 11 %   Monocytes Absolute 1.8 (H) 0 - 1 K/uL   Eosinophils Relative 1 %   Eosinophils Absolute 0.2 0 - 0 K/uL   Basophils Relative 0 %   Basophils Absolute 0.0 0 - 0 K/uL   Immature Granulocytes 1 %   Abs Immature Granulocytes 0.13 (H) 0.00 - 0.07 K/uL  Lipase, blood  Result Value Ref Range   Lipase 47 11 - 51 U/L  Basic metabolic panel  Result Value Ref Range   Sodium 138 135 - 145 mmol/L   Potassium 3.3 (L) 3.5 -  5.1 mmol/L   Chloride 103 98 - 111 mmol/L   CO2 25 22 - 32 mmol/L   Glucose, Bld 130 (H) 70 - 99 mg/dL   BUN 12 6 - 20 mg/dL   Creatinine, Ser 6.57 0.61 - 1.24 mg/dL   Calcium 8.8 (L) 8.9 - 10.3 mg/dL   GFR calc non Af Amer >60 >60 mL/min   GFR calc Af Amer >60 >60 mL/min   Anion gap 10 5 - 15  CBC with Differential/Platelet  Result Value Ref Range   WBC 15.9 (H) 4.0 - 10.5 K/uL   RBC 4.45 4.22 - 5.81 MIL/uL   Hemoglobin 13.5 13.0 - 17.0 g/dL   HCT 84.6 (L) 39 - 52 %   MCV 81.3 80.0 - 100.0 fL   MCH 30.3 26.0 - 34.0 pg   MCHC 37.3 (H) 30.0 - 36.0 g/dL   RDW 96.2 95.2 - 84.1 %   Platelets 276 150 - 400 K/uL   nRBC 0.0 0.0 - 0.2 %   Neutrophils Relative % 79 %   Neutro Abs 12.6 (H) 1.7 - 7.7 K/uL   Lymphocytes Relative 4 %   Lymphs Abs 0.7 0.7 - 4.0 K/uL   Monocytes Relative 13 %   Monocytes Absolute 2.1 (H) 0 - 1 K/uL   Eosinophils Relative 3 %   Eosinophils Absolute 0.4 0 - 0 K/uL   Basophils Relative 0 %   Basophils Absolute 0.1  0 - 0 K/uL   Immature Granulocytes 1 %   Abs Immature Granulocytes 0.14 (H) 0.00 - 0.07 K/uL  Magnesium  Result Value Ref Range   Magnesium 2.2 1.7 - 2.4 mg/dL  Comprehensive metabolic panel  Result Value Ref Range   Sodium 134 (L) 135 - 145 mmol/L   Potassium 3.1 (L) 3.5 - 5.1 mmol/L   Chloride 99 98 - 111 mmol/L   CO2 24 22 - 32 mmol/L   Glucose, Bld 136 (H) 70 - 99 mg/dL   BUN 11 6 - 20 mg/dL   Creatinine, Ser 3.24 0.61 - 1.24 mg/dL   Calcium 8.6 (L) 8.9 - 10.3 mg/dL   Total Protein 6.9 6.5 - 8.1 g/dL   Albumin 3.4 (L) 3.5 - 5.0 g/dL   AST 19 15 - 41 U/L   ALT 22 0 - 44 U/L   Alkaline Phosphatase 59 38 - 126 U/L   Total Bilirubin 2.0 (H) 0.3 - 1.2 mg/dL   GFR calc non Af Amer >60 >60 mL/min   GFR calc Af Amer >60 >60 mL/min   Anion gap 11 5 - 15  Magnesium  Result Value Ref Range   Magnesium 2.1 1.7 - 2.4 mg/dL  Phosphorus  Result Value Ref Range   Phosphorus 1.5 (L) 2.5 - 4.6 mg/dL  CBC  Result Value Ref Range   WBC 16.2 (H) 4.0 - 10.5 K/uL   RBC 4.33 4.22 - 5.81 MIL/uL   Hemoglobin 13.1 13.0 - 17.0 g/dL   HCT 40.1 (L) 39 - 52 %   MCV 83.8 80.0 - 100.0 fL   MCH 30.3 26.0 - 34.0 pg   MCHC 36.1 (H) 30.0 - 36.0 g/dL   RDW 02.7 25.3 - 66.4 %   Platelets 256 150 - 400 K/uL   nRBC 0.0 0.0 - 0.2 %  Lipase, blood  Result Value Ref Range   Lipase 45 11 - 51 U/L  Phosphorus  Result Value Ref Range   Phosphorus 2.0 (L) 2.5 - 4.6 mg/dL  Basic metabolic panel  Result Value Ref Range   Sodium 133 (L) 135 - 145 mmol/L   Potassium 2.9 (L) 3.5 - 5.1 mmol/L   Chloride 98 98 - 111 mmol/L   CO2 23 22 - 32 mmol/L   Glucose, Bld 103 (H) 70 - 99 mg/dL   BUN 10 6 - 20 mg/dL   Creatinine, Ser 2.44 0.61 - 1.24 mg/dL   Calcium 8.6 (L) 8.9 - 10.3 mg/dL   GFR calc non Af Amer >60 >60 mL/min   GFR calc Af Amer >60 >60 mL/min   Anion gap 12 5 - 15  CBC with Differential/Platelet  Result Value Ref Range   WBC 13.4 (H) 4.0 - 10.5 K/uL   RBC 4.45 4.22 - 5.81 MIL/uL   Hemoglobin  13.4 13.0 - 17.0 g/dL   HCT 01.0 (L) 39 - 52 %   MCV 81.6 80.0 - 100.0 fL   MCH 30.1 26.0 - 34.0 pg   MCHC 36.9 (H) 30.0 - 36.0 g/dL   RDW 27.2 53.6 - 64.4 %   Platelets 296 150 - 400 K/uL   nRBC 0.0 0.0 - 0.2 %   Neutrophils Relative % 69 %   Neutro Abs 9.4 (H) 1.7 - 7.7 K/uL   Lymphocytes Relative 7 %   Lymphs Abs 0.9 0.7 - 4.0 K/uL   Monocytes Relative 16 %   Monocytes Absolute 2.1 (H) 0 - 1 K/uL   Eosinophils Relative 4 %   Eosinophils Absolute 0.5 0 - 0 K/uL   Basophils Relative 1 %   Basophils Absolute 0.1 0 - 0 K/uL   Immature Granulocytes 3 %   Abs Immature Granulocytes 0.38 (H) 0.00 - 0.07 K/uL  Basic metabolic panel  Result Value Ref Range   Sodium 135 135 - 145 mmol/L   Potassium 2.9 (L) 3.5 - 5.1 mmol/L   Chloride 98 98 - 111 mmol/L   CO2 25 22 - 32 mmol/L   Glucose, Bld 94 70 - 99 mg/dL   BUN 13 6 - 20 mg/dL   Creatinine, Ser 0.34 0.61 - 1.24 mg/dL   Calcium 8.8 (L) 8.9 - 10.3 mg/dL   GFR calc non Af Amer >60 >60 mL/min   GFR calc Af Amer >60 >60 mL/min   Anion gap 12 5 - 15  Phosphorus  Result Value Ref Range   Phosphorus 2.7 2.5 - 4.6 mg/dL      Assessment & Plan:   Problem List Items Addressed This Visit    Recurrent major depression in full remission (HCC)    Prior major depression, in setting of anxiety/insomnia - now resolved in full remission On trazodone      Relevant Medications   traZODone (DESYREL) 100 MG tablet   ALPRAZolam (XANAX) 1 MG tablet   Insomnia    Poor sleep off Trazodone for 1 week - ran out of med  Previously Improved and and well controlled on current regimen trazodone + alprazolam Reviewed PDMP for past 2 years.  Plan: Refilled Xanax 1mg  at night [facility] PRN #30 +2 refills for 3 month supply Continue Trazodone - refill today      Relevant Medications   traZODone (DESYREL) 100 MG tablet   ALPRAZolam (XANAX) 1 MG tablet   Benign hypertension with CKD (chronic kidney disease), stage II - Primary    Controlled HTN.  Elevated today due to off med for 1 week needs refill - Home BP readings normal  Prior mild CKD-II, stable now    Plan:  1. Continue  current BP regimen - Amlodipine 10mg  daily - REFILL 2. Encourage improved lifestyle - low sodium diet, regular exercise 3. Continue monitor BP outside office, bring readings to next visit, if persistently >140/90 or new symptoms notify office sooner      Relevant Medications   amLODipine (NORVASC) 10 MG tablet   Anxiety    Controlled on Alprazolam Secondary w/ history of depression, insomnia      Relevant Medications   traZODone (DESYREL) 100 MG tablet   ALPRAZolam (XANAX) 1 MG tablet   Alcohol dependence in early full remission (HCC)    Other Visit Diagnoses    Needs flu shot       Relevant Orders   Flu Vaccine QUAD 36+ mos IM (Completed)   Essential hypertension       Relevant Medications   amLODipine (NORVASC) 10 MG tablet      Meds ordered this encounter  Medications  . traZODone (DESYREL) 100 MG tablet    Sig: Take 1 tablet (100 mg total) by mouth at bedtime.    Dispense:  30 tablet    Refill:  11  . amLODipine (NORVASC) 10 MG tablet    Sig: Take 1 tablet (10 mg total) by mouth daily.    Dispense:  30 tablet    Refill:  11  . ALPRAZolam (XANAX) 1 MG tablet    Sig: Take 1 tablet (1 mg total) by mouth at bedtime as needed for anxiety.    Dispense:  30 tablet    Refill:  2      Follow up plan: Return in about 6 weeks (around 04/15/2020) for Follow-up 5-6 weeks Annual Physical ([facility] before visit).  Future labs ordered for [facility] 4-6 weeks, printed, given to patient.  04/17/2020, DO [facility] 03/04/2020, 4:23 PM

## 2020-03-04 NOTE — Assessment & Plan Note (Signed)
Prior major depression, in setting of anxiety/insomnia - now resolved in full remission On trazodone

## 2020-03-04 NOTE — Assessment & Plan Note (Addendum)
Poor sleep off Trazodone for 1 week - ran out of med  Previously Improved and and well controlled on current regimen trazodone + alprazolam Reviewed PDMP for past 2 years.  Plan: Refilled Xanax 1mg  at night [facility]HS PRN #30 +2 refills for 3 month supply Continue Trazodone - refill today

## 2020-03-04 NOTE — Patient Instructions (Addendum)
Thank you for coming to the office today.  Refilled meds now - Trazodone and Amlodipine for 30 day with +11 refills.  Refilled Xanax 1mg  nightly as needed 30 pills +2 refills  DUE for FASTING BLOOD WORK (no food or drink after midnight before the lab appointment, only water or coffee without cream/sugar on the morning of)  [facility] - in about 4-6 weeks  For Lab Results, once available within 2-3 days of blood draw, you can can log in to MyChart online to view your results and a brief explanation. Also, we can discuss results at next follow-up visit.   Please schedule a Follow-up Appointment to: Return in about 6 weeks (around 04/15/2020) for Follow-up 5-6 weeks Annual Physical ([facility] before visit).  If you have any other questions or concerns, please feel free to call the office or send a message through MyChart. You may also schedule an earlier appointment if necessary.  Additionally, you may be receiving a survey about your experience at our office within a few days to 1 week by e-mail or mail. We value your feedback.  04/17/2020, DO [facility], [facility]

## 2020-03-04 NOTE — Assessment & Plan Note (Signed)
Controlled HTN. Elevated today due to off med for 1 week needs refill - Home BP readings normal  Prior mild CKD-II, stable now    Plan:  1. Continue current BP regimen - Amlodipine 10mg  daily - REFILL 2. Encourage improved lifestyle - low sodium diet, regular exercise 3. Continue monitor BP outside office, bring readings to next visit, if persistently >140/90 or new symptoms notify office sooner

## 2020-04-04 LAB — LIPID PANEL

## 2020-04-05 LAB — COMPREHENSIVE METABOLIC PANEL
ALT: 17 IU/L (ref 0–44)
AST: 18 IU/L (ref 0–40)
Albumin/Globulin Ratio: 2 (ref 1.2–2.2)
Albumin: 4.8 g/dL (ref 3.8–4.9)
Alkaline Phosphatase: 53 IU/L (ref 44–121)
BUN/Creatinine Ratio: 12 (ref 9–20)
BUN: 16 mg/dL (ref 6–24)
Bilirubin Total: 0.8 mg/dL (ref 0.0–1.2)
CO2: 28 mmol/L (ref 20–29)
Calcium: 10.2 mg/dL (ref 8.7–10.2)
Chloride: 101 mmol/L (ref 96–106)
Creatinine, Ser: 1.35 mg/dL — ABNORMAL HIGH (ref 0.76–1.27)
GFR calc Af Amer: 68 mL/min/{1.73_m2} (ref >59)
GFR calc non Af Amer: 59 mL/min/{1.73_m2} — ABNORMAL LOW (ref >59)
Globulin, Total: 2.4 g/dL (ref 1.5–4.5)
Glucose: 115 mg/dL — ABNORMAL HIGH (ref 65–99)
Potassium: 5 mmol/L (ref 3.5–5.2)
Sodium: 144 mmol/L (ref 134–144)
Total Protein: 7.2 g/dL (ref 6.0–8.5)

## 2020-04-05 LAB — CBC WITH DIFFERENTIAL/PLATELET
Basophils Absolute: 0.1 10*3/uL (ref 0.0–0.2)
Basos: 1 %
EOS (ABSOLUTE): 0.2 10*3/uL (ref 0.0–0.4)
Eos: 4 %
Hematocrit: 46.1 % (ref 37.5–51.0)
Hemoglobin: 15.8 g/dL (ref 13.0–17.7)
Immature Grans (Abs): 0 10*3/uL (ref 0.0–0.1)
Immature Granulocytes: 0 %
Lymphocytes Absolute: 1 10*3/uL (ref 0.7–3.1)
Lymphs: 17 %
MCH: 30.2 pg (ref 26.6–33.0)
MCHC: 34.3 g/dL (ref 31.5–35.7)
MCV: 88 fL (ref 79–97)
Monocytes Absolute: 0.8 10*3/uL (ref 0.1–0.9)
Monocytes: 13 %
Neutrophils Absolute: 3.8 10*3/uL (ref 1.4–7.0)
Neutrophils: 65 %
Platelets: 269 10*3/uL (ref 150–450)
RBC: 5.24 x10E6/uL (ref 4.14–5.80)
RDW: 12.7 % (ref 11.6–15.4)
WBC: 5.8 10*3/uL (ref 3.4–10.8)

## 2020-04-05 LAB — PSA: Prostate Specific Ag, Serum: 0.6 ng/mL (ref 0.0–4.0)

## 2020-04-05 LAB — LIPID PANEL
Chol/HDL Ratio: 3.7 ratio (ref 0.0–5.0)
Cholesterol, Total: 229 mg/dL — ABNORMAL HIGH (ref 100–199)
HDL: 62 mg/dL (ref >39)
LDL Chol Calc (NIH): 152 mg/dL — ABNORMAL HIGH (ref 0–99)
Triglycerides: 86 mg/dL (ref 0–149)
VLDL Cholesterol Cal: 15 mg/dL (ref 5–40)

## 2020-04-05 LAB — HEMOGLOBIN A1C
Est. average glucose Bld gHb Est-mCnc: 111 mg/dL
Hgb A1c MFr Bld: 5.5 % (ref 4.8–5.6)

## 2020-04-05 LAB — TSH: TSH: 0.608 u[IU]/mL (ref 0.450–4.500)

## 2020-04-05 LAB — VITAMIN D 25 HYDROXY (VIT D DEFICIENCY, FRACTURES): Vit D, 25-Hydroxy: 32.8 ng/mL (ref 30.0–100.0)

## 2020-04-11 ENCOUNTER — Other Ambulatory Visit: Payer: Self-pay

## 2020-04-11 ENCOUNTER — Encounter: Payer: Self-pay | Admitting: Family Medicine

## 2020-04-11 ENCOUNTER — Other Ambulatory Visit: Payer: Self-pay | Admitting: Family Medicine

## 2020-04-11 ENCOUNTER — Ambulatory Visit (INDEPENDENT_AMBULATORY_CARE_PROVIDER_SITE_OTHER): Payer: No Typology Code available for payment source | Admitting: Family Medicine

## 2020-04-11 VITALS — BP 132/85 | HR 76 | Temp 97.7°F | Resp 16 | Ht 69.0 in | Wt 167.6 lb

## 2020-04-11 DIAGNOSIS — F419 Anxiety disorder, unspecified: Secondary | ICD-10-CM

## 2020-04-11 DIAGNOSIS — I129 Hypertensive chronic kidney disease with stage 1 through stage 4 chronic kidney disease, or unspecified chronic kidney disease: Secondary | ICD-10-CM | POA: Diagnosis not present

## 2020-04-11 DIAGNOSIS — Z0000 Encounter for general adult medical examination without abnormal findings: Secondary | ICD-10-CM | POA: Diagnosis not present

## 2020-04-11 DIAGNOSIS — F3342 Major depressive disorder, recurrent, in full remission: Secondary | ICD-10-CM

## 2020-04-11 DIAGNOSIS — N1831 Chronic kidney disease, stage 3a: Secondary | ICD-10-CM | POA: Diagnosis not present

## 2020-04-11 DIAGNOSIS — F1021 Alcohol dependence, in remission: Secondary | ICD-10-CM

## 2020-04-11 DIAGNOSIS — E782 Mixed hyperlipidemia: Secondary | ICD-10-CM

## 2020-04-11 DIAGNOSIS — Z23 Encounter for immunization: Secondary | ICD-10-CM | POA: Diagnosis not present

## 2020-04-11 DIAGNOSIS — N400 Enlarged prostate without lower urinary tract symptoms: Secondary | ICD-10-CM

## 2020-04-11 DIAGNOSIS — N183 Chronic kidney disease, stage 3 unspecified: Secondary | ICD-10-CM

## 2020-04-11 NOTE — Assessment & Plan Note (Signed)
Elevated LDL up to 152, prior 120-130 range. Some poor dietary choices, fried foods Last lipid panel 03/2020 The 10-year ASCVD risk score Denman George DC Jr., et al., 2013) is: 6.7%  Plan: 1. Not indicated for statin therapy 2. Encourage improved lifestyle - low carb/cholesterol, reduce portion size, continue improving regular exercise  Check lipid yearly

## 2020-04-11 NOTE — Assessment & Plan Note (Addendum)
Improved PSA normal Off medication still

## 2020-04-11 NOTE — Assessment & Plan Note (Signed)
Remission Stable Alcohol free 

## 2020-04-11 NOTE — Assessment & Plan Note (Addendum)
See A&P HTN CKD Elevated Creatinine 1.3, GFR 59 Likely due to poor hydration, BC powder BP is controlled Repeat BMET Cr trend 3 months Reconsider BPH if still has any possible BOO Future reconsider Nephrology if indicated

## 2020-04-11 NOTE — Patient Instructions (Addendum)
Thank you for coming to the office today.  Elevated Creatinine, reduced kidney function.  Try to restart things with hydration water intake and limit goody powder that can harm kidneys.  Elevated cholesterol. Goal to limit fried fatty foods. No cholesterol medicine needed.  Recent Labs    04/04/20 0938  HGBA1C 5.5    DUE for NON fasting BLOOD WORK  SCHEDULE "Lab Only" visit in the morning at the clinic for lab draw in 3 MONTHS   - Make sure Lab Only appointment is at about 1 week before your next appointment, so that results will be available  For Lab Results, once available within 2-3 days of blood draw, you can can log in to MyChart online to view your results and a brief explanation. Also, we can discuss results at next follow-up visit.    Please schedule a Follow-up Appointment to: Return in about 3 months (around 07/12/2020) for 3 month non fasting lab only then 1 week later follow-up HTN Kidney result..  If you have any other questions or concerns, please feel free to call the office or send a message through MyChart. You may also schedule an earlier appointment if necessary.  Additionally, you may be receiving a survey about your experience at our office within a few days to 1 week by e-mail or mail. We value your feedback.  Saralyn Pilar, DO [facility], [place]

## 2020-04-11 NOTE — Progress Notes (Signed)
Subjective:    Patient ID: Brett Huber, male    DOB: Nov 13, 1965, 54 y.o.   MRN: [id number]  ZION TA is a 54 y.o. male presenting on 04/11/2020 for Annual Exam   HPI   Here for Annual Physical and Lab Review.  CHRONIC HTNCKD III BP improved now back on meds Current Meds -Amlodipine 10mg daily Reports good compliance, took meds today. Tolerating well, w/o complaints. Denies CP, dyspnea, HA, edema, dizziness / lightheadedness  HYPERLIPIDEMIA: - Reports no concerns. Last lipid panel 03/2020, elevated LDL 152 now attributed to fried foods poor diet Not on statin.  INSOMNIA, Chronic Anxiety Major Depression recurrent in remission See prior notes, has been stable on current med management now. He had been doing very well. Denies depression. Anxiety has been controlled. Sleep has improved on medicines, no changes lately until past 1 week ran out of med, some poor sleep without trazodone, still has 5 pills left of alprazolam. - taking Trazodone 100mg nightly - Taking Xanax 1mg = one whole 1mg pill 4-5 days a week before bed for insomnia on work days, and occasionally takes a half pill for a few nights before bed if weekend  PMH Alcohol dependence in remission - abstain from alcohol still.   Health Maintenance: UTD COVID vaccine, will bring 04/2020 copy of card next visit. UTD Flu Shot  Due for Tdap today, last in 2011  Colon CA Screening: Never had colonoscopy. Currently asymptomatic. No known family history of colon CA.  - Cologuard negative 05/22/2019 - next due 04/2022  Prostate CA Screening: No prior prostate CA screening Last PSA 0.6 (03/2020) last time was 0.4. Currently asymptomatic. No known family history of prostate CA.   Depression screen [facility] 2/9 04/11/2020 03/04/2020 04/13/2019  Decreased Interest 0 0 0  Down, Depressed, Hopeless 0 0 0  PHQ - 2 Score 0 0 0  Altered sleeping 0 0 0  Tired, decreased energy 0 0 0  Change in appetite 0 0 0    Feeling bad or failure about yourself  0 0 0  Trouble concentrating 0 0 0  Moving slowly or fidgety/restless 0 0 0  Suicidal thoughts 0 0 0  PHQ-9 Score 0 0 0  Difficult doing work/chores Not difficult at all Not difficult at all Not difficult at all  Some recent data might be hidden   GAD 7 : Generalized Anxiety Score 04/11/2020 03/04/2020 04/13/2019 04/05/2019  Nervous, Anxious, on Edge 0 0 0 0  Control/stop worrying 0 0 0 0  Worry too much - different things 0 0 0 1  Trouble relaxing 0 0 0 0  Restless 0 0 0 0  Easily annoyed or irritable 0 0 0 0  Afraid - awful might happen 0 0 0 1  Total GAD 7 Score 0 0 0 2  Anxiety Difficulty Not difficult at all Not difficult at all Not difficult at all Not difficult at all      Past Medical History:  Diagnosis Date  . Anxiety   . Chronic pain   . DDD (degenerative disc disease)   . Hyperlipidemia   . Hypertension   . Insomnia    Past Surgical History:  Procedure Laterality Date  . CERVICAL FUSION  2004  . CHOLECYSTECTOMY  2007  . neck and disc replacement    . SHOULDER ARTHROSCOPY WITH ROTATOR CUFF REPAIR AND SUBACROMIAL DECOMPRESSION Left 01/29/2013   Procedure: LEFT SHOULDER ARTHROSCOPY WITH ROTATOR CUFF REPAIR AND SUBACROMIAL DECOMPRESSION DISTAL CLAVICLE RESECTION;  Surgeon: 06/05/2019,  MD;  Location: [facility];  Service: Orthopedics;  Laterality: Left;   Social History   Socioeconomic History  . Marital status: Married    Spouse name: Not on file  . Number of children: Not on file  . Years of education: Not on file  . Highest education level: Not on file  Occupational History  . Not on file  Tobacco Use  . Smoking status: Never Smoker  . Smokeless tobacco: Current User    Types: Chew  Substance and Sexual Activity  . Alcohol use: Yes    Comment: occ  . Drug use: No  . Sexual activity: Not on file  Other Topics Concern  . Not on file  Social History Narrative  . Not on file   Social  Determinants of Health   Financial Resource Strain:   . Difficulty of Paying Living Expenses: Not on file  Food Insecurity:   . Worried About Programme researcher, broadcasting/film/video in the Last Year: Not on file  . Ran Out of Food in the Last Year: Not on file  Transportation Needs:   . Lack of Transportation (Medical): Not on file  . Lack of Transportation (Non-Medical): Not on file  Physical Activity:   . Days of Exercise per Week: Not on file  . Minutes of Exercise per Session: Not on file  Stress:   . Feeling of Stress : Not on file  Social Connections:   . Frequency of Communication with Friends and Family: Not on file  . Frequency of Social Gatherings with Friends and Family: Not on file  . Attends Religious Services: Not on file  . Active Member of Clubs or Organizations: Not on file  . Attends Banker Meetings: Not on file  . Marital Status: Not on file  Intimate Partner Violence:   . Fear of Current or Ex-Partner: Not on file  . Emotionally Abused: Not on file  . Physically Abused: Not on file  . Sexually Abused: Not on file   Family History  Problem Relation Age of Onset  . Diabetes Mother   . Stroke Father   . Prostate cancer Neg Hx   . Kidney cancer Neg Hx   . Bladder Cancer Neg Hx    Current Outpatient Medications on File Prior to Visit  Medication Sig  . acetaminophen (TYLENOL) 325 MG tablet Take 2 tablets (650 mg total) by mouth every 6 (six) hours as needed for mild pain, moderate pain or headache.  . ALPRAZolam (XANAX) 1 MG tablet Take 1 tablet (1 mg total) by mouth at bedtime as needed for anxiety.  Marland Kitchen amLODipine (NORVASC) 10 MG tablet Take 1 tablet (10 mg total) by mouth daily.  . Multiple Vitamin (MULTIVITAMIN WITH MINERALS) TABS tablet Take 1 tablet by mouth daily.  . traZODone (DESYREL) 100 MG tablet Take 1 tablet (100 mg total) by mouth at bedtime.  . triamcinolone ointment (KENALOG) 0.5 % Apply 1 application topically 2 (two) times daily. 1-2 weeks, for  hands.   No current facility-administered medications on file prior to visit.    Review of Systems  Constitutional: Negative for activity change, appetite change, chills, diaphoresis, fatigue and fever.  HENT: Negative for congestion and hearing loss.   Eyes: Negative for visual disturbance.  Respiratory: Negative for apnea, cough, chest tightness, shortness of breath and wheezing.   Cardiovascular: Negative for chest pain, palpitations and leg swelling.  Gastrointestinal: Negative for abdominal pain, constipation, diarrhea, nausea and vomiting.  Endocrine: Negative for  cold intolerance.  Genitourinary: Negative for difficulty urinating, dysuria, frequency and hematuria.  Musculoskeletal: Negative for arthralgias and neck pain.  Skin: Negative for rash.  Allergic/Immunologic: Negative for environmental allergies.  Neurological: Negative for dizziness, weakness, light-headedness, numbness and headaches.  Hematological: Negative for adenopathy.  Psychiatric/Behavioral: Negative for behavioral problems, dysphoric mood and sleep disturbance.   Per HPI unless specifically indicated above     Objective:    BP 132/85   Pulse 76   Temp 97.7 F (36.5 C) (Temporal)   Resp 16   Ht 5\' 9"  (1.753 m)   Wt 167 lb 9.6 oz (76 kg)   SpO2 98%   BMI 24.75 kg/m   Wt Readings from Last 3 Encounters:  04/11/20 167 lb 9.6 oz (76 kg)  03/04/20 172 lb (78 kg)  06/01/19 173 lb (78.5 kg)    Physical Exam Vitals and nursing note reviewed.  Constitutional:      General: He is not in acute distress.    Appearance: He is well-developed. He is not diaphoretic.     Comments: Well-appearing, comfortable, cooperative  HENT:     Head: Normocephalic and atraumatic.  Eyes:     General:        Right eye: No discharge.        Left eye: No discharge.     Conjunctiva/sclera: Conjunctivae normal.     Pupils: Pupils are equal, round, and reactive to light.  Neck:     Thyroid: No thyromegaly.    Cardiovascular:     Rate and Rhythm: Normal rate and regular rhythm.     Heart sounds: Normal heart sounds. No murmur heard.   Pulmonary:     Effort: Pulmonary effort is normal. No respiratory distress.     Breath sounds: Normal breath sounds. No wheezing or rales.  Abdominal:     General: Bowel sounds are normal. There is no distension.     Palpations: Abdomen is soft. There is no mass.     Tenderness: There is no abdominal tenderness.  Musculoskeletal:        General: No tenderness. Normal range of motion.     Cervical back: Normal range of motion and neck supple.     Right lower leg: No edema.     Left lower leg: No edema.     Comments: Upper / Lower Extremities: - Normal muscle tone, strength bilateral upper extremities 5/5, lower extremities 5/5  Lymphadenopathy:     Cervical: No cervical adenopathy.  Skin:    General: Skin is warm and dry.     Findings: No erythema or rash.  Neurological:     Mental Status: He is alert and oriented to person, place, and time.     Comments: Distal sensation intact to light touch all extremities  Psychiatric:        Behavior: Behavior normal.     Comments: Well groomed, good eye contact, normal speech and thoughts       Results for orders placed or performed in visit on 03/04/20  Hemoglobin A1c  Result Value Ref Range   Hgb A1c MFr Bld 5.5 4.8 - 5.6 %   Est. average glucose Bld gHb Est-mCnc 111 mg/dL  CBC with Differential/Platelet  Result Value Ref Range   WBC 5.8 3.4 - 10.8 x10E3/uL   RBC 5.24 4.14 - 5.80 x10E6/uL   Hemoglobin 15.8 13.0 - 17.7 g/dL   Hematocrit 05/04/20 47.0 - 51.0 %   MCV 88 79 - 97 fL   MCH  30.2 26.6 - 33.0 pg   MCHC 34.3 31 - 35 g/dL   RDW 16.1 09.6 - 04.5 %   Platelets 269 150 - 450 x10E3/uL   Neutrophils 65 Not Estab. %   Lymphs 17 Not Estab. %   Monocytes 13 Not Estab. %   Eos 4 Not Estab. %   Basos 1 Not Estab. %   Neutrophils Absolute 3.8 1 - 7 x10E3/uL   Lymphocytes Absolute 1.0 0 - 3 x10E3/uL    Monocytes Absolute 0.8 0 - 0 x10E3/uL   EOS (ABSOLUTE) 0.2 0.0 - 0.4 x10E3/uL   Basophils Absolute 0.1 0 - 0 x10E3/uL   Immature Granulocytes 0 Not Estab. %   Immature Grans (Abs) 0.0 0.0 - 0.1 x10E3/uL  Lipid panel  Result Value Ref Range   Cholesterol, Total 229 (H) 100 - 199 mg/dL   Triglycerides 86 0 - 149 mg/dL   HDL 62 >40 mg/dL   VLDL Cholesterol Cal 15 5 - 40 mg/dL   LDL Chol Calc (NIH) 981 (H) 0 - 99 mg/dL   Chol/HDL Ratio 3.7 0.0 - 5.0 ratio  PSA  Result Value Ref Range   Prostate Specific Ag, Serum 0.6 0.0 - 4.0 ng/mL  Comprehensive metabolic panel  Result Value Ref Range   Glucose 115 (H) 65 - 99 mg/dL   BUN 16 6 - 24 mg/dL   Creatinine, Ser 1.91 (H) 0.76 - 1.27 mg/dL   GFR calc non Af Amer 59 (L) >59 mL/min/1.73   GFR calc Af Amer 68 >59 mL/min/1.73   BUN/Creatinine Ratio 12 9 - 20   Sodium 144 134 - 144 mmol/L   Potassium 5.0 3.5 - 5.2 mmol/L   Chloride 101 96 - 106 mmol/L   CO2 28 20 - 29 mmol/L   Calcium 10.2 8.7 - 10.2 mg/dL   Total Protein 7.2 6.0 - 8.5 g/dL   Albumin 4.8 3.8 - 4.9 g/dL   Globulin, Total 2.4 1.5 - 4.5 g/dL   Albumin/Globulin Ratio 2.0 1.2 - 2.2   Bilirubin Total 0.8 0.0 - 1.2 mg/dL   Alkaline Phosphatase 53 44 - 121 IU/L   AST 18 0 - 40 IU/L   ALT 17 0 - 44 IU/L  VITAMIN D 25 Hydroxy (Vit-D Deficiency, Fractures)  Result Value Ref Range   Vit D, 25-Hydroxy 32.8 30.0 - 100.0 ng/mL  TSH  Result Value Ref Range   TSH 0.608 0.450 - 4.500 uIU/mL      Assessment & Plan:   Problem List Items Addressed This Visit    Recurrent major depression in full remission (HCC)    Remission PHQ 0 Continue current therapy      Hyperlipidemia    Elevated LDL up to 152, prior 120-130 range. Some poor dietary choices, fried foods Last lipid panel 03/2020 The 10-year ASCVD risk score Denman George DC Jr., et al., 2013) is: 6.7%  Plan: 1. Not indicated for statin therapy 2. Encourage improved lifestyle - low carb/cholesterol, reduce portion size,  continue improving regular exercise  Check lipid yearly      CKD (chronic kidney disease), stage III (HCC)    See A&P HTN CKD Elevated Creatinine 1.3, GFR 59 Likely due to poor hydration, BC powder BP is controlled Repeat BMET Cr trend 3 months Reconsider BPH if still has any possible BOO Future reconsider Nephrology if indicated      BPH (benign prostatic hyperplasia)    Improved PSA normal Off medication still      Benign hypertension with  CKD (chronic kidney disease) stage III (HCC)    Controlled HTN now back on meds - Home BP readings normal  Elevated creatinine w/ CKD-III at this time, likely poor hydration by his report and BC powder    Plan:  1. Continue current BP regimen - Amlodipine 10mg  daily 2. Encourage improved lifestyle - low sodium diet, regular exercise 3. Continue monitor BP outside office, bring readings to next visit, if persistently >140/90 or new symptoms notify office sooner  Improve hydration, stop or reduce BC powder re-check BMET 3 montns      Anxiety    Controlled on Alprazolam Secondary w/ history of depression, insomnia      Alcohol dependence in remission (HCC)    Remission Stable Alcohol free       Other Visit Diagnoses    Annual physical exam    -  Primary   Need for diphtheria-tetanus-pertussis (Tdap) vaccine       Relevant Orders   Tdap vaccine greater than or equal to 7yo IM (Completed)      Updated Health Maintenance information - Tdap today - need updated dates on COVID vaccine, patient will notify us Reviewed recent lab results with patient Encouraged improvement to lifestyle with diet and exercise - Goal of weight loss   No orders of the defined types were placed in this encounter.   Follow up plan: Return in about 3 months (around 07/12/2020) for 3 month non fasting lab only then 1 week later follow-up HTN Kidney result..  Future BMET lab in 3 months   Saralyn [name] Ridley Dileo, DO [facility] 04/11/2020, 9:44 AM

## 2020-04-11 NOTE — Assessment & Plan Note (Signed)
Controlled on Alprazolam Secondary w/ history of depression, insomnia 

## 2020-04-11 NOTE — Assessment & Plan Note (Signed)
Controlled HTN now back on meds - Home BP readings normal  Elevated creatinine w/ CKD-III at this time, likely poor hydration by his report and BC powder    Plan:  1. Continue current BP regimen - Amlodipine 10mg  daily 2. Encourage improved lifestyle - low sodium diet, regular exercise 3. Continue monitor BP outside office, bring readings to next visit, if persistently >140/90 or new symptoms notify office sooner  Improve hydration, stop or reduce BC powder re-check BMET 3 montns

## 2020-04-11 NOTE — Assessment & Plan Note (Signed)
Remission PHQ 0 Continue current therapy

## 2020-06-06 ENCOUNTER — Other Ambulatory Visit: Payer: Self-pay | Admitting: Family Medicine

## 2020-06-06 DIAGNOSIS — F5101 Primary insomnia: Secondary | ICD-10-CM

## 2020-06-06 NOTE — Telephone Encounter (Signed)
Requested medication (s) are due for refill today:   Provider to determine  Requested medication (s) are on the active medication list:   Yes  Future visit scheduled:   Yes   Last ordered: 03/04/2020 #30, 2refills  Non delegated refill   Requested Prescriptions  Pending Prescriptions Disp Refills   ALPRAZolam (XANAX) 1 MG tablet [Pharmacy Med Name: ALPRAZolam 1 MG Oral Tablet] 30 tablet 0    Sig: TAKE 1 TABLET BY MOUTH AT BEDTIME AS NEEDED FOR ANXIETY      Not Delegated - Psychiatry:  Anxiolytics/Hypnotics Failed - 06/06/2020  7:10 AM      Failed - This refill cannot be delegated      Failed - Urine Drug Screen completed in last 360 days      Passed - Valid encounter within last 6 months    Recent Outpatient Visits           1 month ago Annual physical exam   [facility] Smitty Cords, DO   3 months ago Benign hypertension with CKD (chronic kidney disease), stage II   [facility], Netta Neat, DO   12 months ago Alcohol-induced acute pancreatitis without infection or necrosis   [facility], Netta Neat, DO   1 year ago Annual physical exam   [facility] Smitty Cords, DO   1 year ago Strain of right shoulder, initial encounter   [facility] Smitty Cords, DO       Future Appointments             In 1 month Althea Charon, Netta Neat, DO [facility]

## 2020-07-04 ENCOUNTER — Telehealth: Payer: Self-pay | Admitting: Family Medicine

## 2020-07-04 ENCOUNTER — Other Ambulatory Visit: Payer: No Typology Code available for payment source

## 2020-07-04 DIAGNOSIS — I129 Hypertensive chronic kidney disease with stage 1 through stage 4 chronic kidney disease, or unspecified chronic kidney disease: Secondary | ICD-10-CM

## 2020-07-04 DIAGNOSIS — N182 Chronic kidney disease, stage 2 (mild): Secondary | ICD-10-CM

## 2020-07-04 NOTE — Telephone Encounter (Signed)
Ordered for [facility] and left message for the patient that it was released if he prefers he does not need to come by the office their system can see it.

## 2020-07-04 NOTE — Telephone Encounter (Signed)
Copied from CRM [id number]. Topic: General - Other >> Jul 04, 2020  8:16 AM Gwenlyn Fudge wrote: Reason for CRM: PT calling and is requesting to have his lab orders written for [facility] that he can come to the office and pick up. Please advise.

## 2020-07-05 LAB — BASIC METABOLIC PANEL
BUN/Creatinine Ratio: 13 (ref 9–20)
BUN: 15 mg/dL (ref 6–24)
CO2: 25 mmol/L (ref 20–29)
Calcium: 10.1 mg/dL (ref 8.7–10.2)
Chloride: 103 mmol/L (ref 96–106)
Creatinine, Ser: 1.18 mg/dL (ref 0.76–1.27)
GFR calc Af Amer: 79 mL/min/{1.73_m2} (ref >59)
GFR calc non Af Amer: 69 mL/min/{1.73_m2} (ref >59)
Glucose: 108 mg/dL — ABNORMAL HIGH (ref 65–99)
Potassium: 3.9 mmol/L (ref 3.5–5.2)
Sodium: 141 mmol/L (ref 134–144)

## 2020-07-11 ENCOUNTER — Ambulatory Visit: Payer: No Typology Code available for payment source | Admitting: Family Medicine

## 2020-07-11 ENCOUNTER — Other Ambulatory Visit: Payer: Self-pay

## 2020-07-11 ENCOUNTER — Encounter: Payer: Self-pay | Admitting: Family Medicine

## 2020-07-11 VITALS — BP 126/81 | HR 79 | Ht 69.0 in | Wt 173.0 lb

## 2020-07-11 DIAGNOSIS — I129 Hypertensive chronic kidney disease with stage 1 through stage 4 chronic kidney disease, or unspecified chronic kidney disease: Secondary | ICD-10-CM | POA: Diagnosis not present

## 2020-07-11 DIAGNOSIS — F1021 Alcohol dependence, in remission: Secondary | ICD-10-CM | POA: Diagnosis not present

## 2020-07-11 DIAGNOSIS — F5101 Primary insomnia: Secondary | ICD-10-CM

## 2020-07-11 DIAGNOSIS — F3342 Major depressive disorder, recurrent, in full remission: Secondary | ICD-10-CM | POA: Diagnosis not present

## 2020-07-11 DIAGNOSIS — N182 Chronic kidney disease, stage 2 (mild): Secondary | ICD-10-CM

## 2020-07-11 DIAGNOSIS — F419 Anxiety disorder, unspecified: Secondary | ICD-10-CM

## 2020-07-11 MED ORDER — ALPRAZOLAM 1 MG PO TABS: ORAL_TABLET | ORAL | 5 refills | Status: DC

## 2020-07-11 NOTE — Assessment & Plan Note (Signed)
Remission PHQ 1 Continue current therapy

## 2020-07-11 NOTE — Assessment & Plan Note (Signed)
Remission Stable Alcohol free

## 2020-07-11 NOTE — Assessment & Plan Note (Signed)
Controlled on Alprazolam Secondary w/ history of depression, insomnia

## 2020-07-11 NOTE — Patient Instructions (Addendum)
Thank you for coming to the office today.  Keep up the good work! Stay well hydrated. Mix of water and gatorade is a good plan.  Refilled xanax 6 months, let me know when ready for refill  Send copy of covid card to mychart  DUE for FASTING BLOOD WORK (no food or drink after midnight before the lab appointment, only water or coffee without cream/sugar on the morning of)  Message me 1-2 weeks prior to when you need blood drawn from [facility] - we can place orders at that time.  For Lab Results, once available within 2-3 days of blood draw, you can can log in to MyChart online to view your results and a brief explanation. Also, we can discuss results at next follow-up visit.   Please schedule a Follow-up Appointment to: Return in about 9 months (around 04/10/2021) for 9 month fasting labs at [facility] - then 1 week later Annual Physical.  If you have any other questions or concerns, please feel free to call the office or send a message through MyChart. You may also schedule an earlier appointment if necessary.  Additionally, you may be receiving a survey about your experience at our office within a few days to 1 week by e-mail or mail. We value your feedback.  Saralyn Pilar, DO [facility], [place]

## 2020-07-11 NOTE — Assessment & Plan Note (Signed)
Controlled insomnia On Trazodone, Xanax  Plan: Refilled Xanax 1mg  at night [facility] PRN #30 +5 refills Continue Trazodone

## 2020-07-11 NOTE — Progress Notes (Signed)
Subjective:    Patient ID: Brett Huber, male    DOB: [birth year], 55 y.o.   MRN: [id number]  Brett Huber is a 55 y.o. male presenting on 07/11/2020 for Hypertension   HPI   CHRONIC HTNCKD II - Last visit with me 03/2020, for annual physical addressed same problem, treated with improved hydration, repeat lab, see prior notes for background information. - Interval update with chemistry showed improvement in Creatinine from 1.34 to 1.18improved hydration 3 bottles water daily and now inc to 4 with some gatorade - Today patient reports doing well - Home BP improved Current Meds -Amlodipine 10mg daily Reports good compliance, took meds today. Tolerating well, w/o complaints. Denies CP, dyspnea, HA, edema, dizziness / lightheadedness  INSOMNIA, Chronic Anxiety Major Depression recurrent in remission See prior notes, has been stable on current med management now. He had been doing very well. Denies depression. Anxiety has been controlled. Sleep has improved on medicines - taking Trazodone 100mg nightly - Taking Xanax 1mg = one whole 1mg pill 4-5 days a week before bed for insomnia on work days, and occasionally takes a half pill for a few nights before bed if weekend - Needs refill Xanax today  Alcohol Dependence in Remission Currently alcohol afree  Health Maintenance: UTD on COVID vaccine and booster, will send copy of card to mychart.  Depression screen [facility] 2/9 07/11/2020 04/11/2020 03/04/2020  Decreased Interest 0 0 0  Down, Depressed, Hopeless 0 0 0  PHQ - 2 Score 0 0 0  Altered sleeping 0 0 0  Tired, decreased energy 1 0 0  Change in appetite 0 0 0  Feeling bad or failure about yourself  0 0 0  Trouble concentrating 0 0 0  Moving slowly or fidgety/restless 0 0 0  Suicidal thoughts 0 0 0  PHQ-9 Score 1 0 0  Difficult doing work/chores Not difficult at all Not difficult at all Not difficult at all  Some recent data might be hidden   GAD 7 : Generalized  Anxiety Score 07/11/2020 04/11/2020 03/04/2020 04/13/2019  Nervous, Anxious, on Edge 0 0 0 0  Control/stop worrying 0 0 0 0  Worry too much - different things 1 0 0 0  Trouble relaxing 0 0 0 0  Restless 0 0 0 0  Easily annoyed or irritable 0 0 0 0  Afraid - awful might happen 1 0 0 0  Total GAD 7 Score 2 0 0 0  Anxiety Difficulty Not difficult at all Not difficult at all Not difficult at all Not difficult at all     Social History   Tobacco Use  . Smoking status: Never Smoker  . Smokeless tobacco: Current User    Types: Chew  Substance Use Topics  . Alcohol use: Yes    Comment: occ  . Drug use: No    Review of Systems Per HPI unless specifically indicated above     Objective:    BP 126/81   Pulse 79   Ht 5\' 9"  (1.753 m)   Wt 173 lb (78.5 kg)   SpO2 99%   BMI 25.55 kg/m   Wt Readings from Last 3 Encounters:  07/11/20 173 lb (78.5 kg)  04/11/20 167 lb 9.6 oz (76 kg)  03/04/20 172 lb (78 kg)    Physical Exam Vitals and nursing note reviewed.  Constitutional:      General: He is not in acute distress.    Appearance: He is well-developed and well-nourished. He is not diaphoretic.  Comments: Well-appearing, comfortable, cooperative  HENT:     Head: Normocephalic and atraumatic.     Mouth/Throat:     Mouth: Oropharynx is clear and moist.  Eyes:     General:        Right eye: No discharge.        Left eye: No discharge.     Conjunctiva/sclera: Conjunctivae normal.  Cardiovascular:     Rate and Rhythm: Normal rate.  Pulmonary:     Effort: Pulmonary effort is normal.  Musculoskeletal:        General: No edema.  Skin:    General: Skin is warm and dry.     Findings: No erythema or rash.  Neurological:     Mental Status: He is alert and oriented to person, place, and time.  Psychiatric:        Mood and Affect: Mood and affect normal.        Behavior: Behavior normal.     Comments: Well groomed, good eye contact, normal speech and thoughts       Results  for orders placed or performed in visit on 07/04/20  Basic Metabolic Panel (BMET)  Result Value Ref Range   Glucose 108 (H) 65 - 99 mg/dL   BUN 15 6 - 24 mg/dL   Creatinine, Ser 1.61 0.76 - 1.27 mg/dL   GFR calc non Af Amer 69 >59 mL/min/1.73   GFR calc Af Amer 79 >59 mL/min/1.73   BUN/Creatinine Ratio 13 9 - 20   Sodium 141 134 - 144 mmol/L   Potassium 3.9 3.5 - 5.2 mmol/L   Chloride 103 96 - 106 mmol/L   CO2 25 20 - 29 mmol/L   Calcium 10.1 8.7 - 10.2 mg/dL      Assessment & Plan:   Problem List Items Addressed This Visit    Recurrent major depression in full remission (HCC)    Remission PHQ 1 Continue current therapy      Relevant Medications   ALPRAZolam (XANAX) 1 MG tablet   Primary insomnia    Controlled insomnia On Trazodone, Xanax  Plan: Refilled Xanax 1mg  at night [facility]HS PRN #30 +5 refills Continue Trazodone      Relevant Medications   ALPRAZolam (XANAX) 1 MG tablet   Benign hypertension with CKD (chronic kidney disease), stage II - Primary    Controlled HTN - Home BP readings normal  Improved hydration. Improved creatinine, CKD-II range now    Plan:  1. Continue current BP regimen - Amlodipine 10mg  daily 2. Encourage improved lifestyle - low sodium diet, regular exercise 3. Continue monitor BP outside office, bring readings to next visit, if persistently >140/90 or new symptoms notify office sooner  Improve hydration, stop or reduce BC powder re-check BMET 3 montns      Anxiety    Controlled on Alprazolam Secondary w/ history of depression, insomnia      Relevant Medications   ALPRAZolam (XANAX) 1 MG tablet   Alcohol dependence in remission (HCC)    Remission Stable Alcohol free         Meds ordered this encounter  Medications  . ALPRAZolam (XANAX) 1 MG tablet    Sig: TAKE 1 TABLET BY MOUTH AT BEDTIME AS NEEDED FOR ANXIETY    Dispense:  30 tablet    Refill:  5      Follow up plan: Return in about 9 months (around 04/10/2021) for 9  month fasting labs at [facility] - then 1 week later Annual Physical.  Need  future [facility] orders. He will notify us  Saralyn Pilar, DO [facility] 07/11/2020, 9:02 AM

## 2020-07-11 NOTE — Assessment & Plan Note (Addendum)
Controlled HTN - Home BP readings normal  Improved hydration. Improved creatinine, CKD-II range now    Plan:  1. Continue current BP regimen - Amlodipine 10mg  daily 2. Encourage improved lifestyle - low sodium diet, regular exercise 3. Continue monitor BP outside office, bring readings to next visit, if persistently >140/90 or new symptoms notify office sooner  Improve hydration, stop or reduce BC powder re-check BMET 3 montns

## 2021-02-06 ENCOUNTER — Other Ambulatory Visit: Payer: Self-pay | Admitting: Family Medicine

## 2021-02-06 DIAGNOSIS — F5101 Primary insomnia: Secondary | ICD-10-CM

## 2021-02-06 NOTE — Telephone Encounter (Signed)
Requested medication (s) are due for refill today:  no  Requested medication (s) are on the active medication list:  yes  Last refill:  12/23/2020  Future visit scheduled:  no  Notes to clinic:  this refill cannot be delegated    Requested Prescriptions  Pending Prescriptions Disp Refills   ALPRAZolam (XANAX) 1 MG tablet [Pharmacy Med Name: ALPRAZolam 1 MG Oral Tablet] 30 tablet 0    Sig: TAKE 1 TABLET BY MOUTH AT BEDTIME AS NEEDED FOR ANXIETY     Not Delegated - Psychiatry:  Anxiolytics/Hypnotics Failed - 02/06/2021 11:07 AM      Failed - This refill cannot be delegated      Failed - Urine Drug Screen completed in last 360 days      Failed - Valid encounter within last 6 months    Recent Outpatient Visits           7 months ago Benign hypertension with CKD (chronic kidney disease), stage II   [facility] [place], Netta Neat, DO   10 months ago Annual physical exam   [facility] Smitty Cords, DO   11 months ago Benign hypertension with CKD (chronic kidney disease), stage II   [facility] [place], Netta Neat, DO   1 year ago Alcohol-induced acute pancreatitis without infection or necrosis   [facility] Smitty Cords, DO   1 year ago Annual physical exam   [facility] [place], Netta Neat, DO

## 2021-02-20 ENCOUNTER — Other Ambulatory Visit: Payer: Self-pay | Admitting: Family Medicine

## 2021-02-20 DIAGNOSIS — F5101 Primary insomnia: Secondary | ICD-10-CM

## 2021-02-20 NOTE — Telephone Encounter (Signed)
Requested medication (s) are due for refill today: Yes  Requested medication (s) are on the active medication list: Yes  Last refill:  03/04/20  Future visit scheduled: No  Notes to clinic:  Unable to refill per protocol, cannot delegate.      Requested Prescriptions  Pending Prescriptions Disp Refills   traZODone (DESYREL) 100 MG tablet [Pharmacy Med Name: traZODone HCl 100 MG Oral Tablet] 30 tablet 0    Sig: TAKE 1 TABLET BY MOUTH AT BEDTIME     Psychiatry: Antidepressants - Serotonin Modulator Failed - 02/20/2021 10:14 AM      Failed - Valid encounter within last 6 months    Recent Outpatient Visits           7 months ago Benign hypertension with CKD (chronic kidney disease), stage II   [facility] [place], Netta Neat, DO   10 months ago Annual physical exam   [facility] Smitty Cords, DO   11 months ago Benign hypertension with CKD (chronic kidney disease), stage II   [facility] [place], Netta Neat, DO   1 year ago Alcohol-induced acute pancreatitis without infection or necrosis   [facility] Smitty Cords, DO   1 year ago Annual physical exam   [facility] [place], Netta Neat, DO              Passed - Completed PHQ-2 or PHQ-9 in the last 360 days

## 2021-03-11 ENCOUNTER — Other Ambulatory Visit: Payer: Self-pay | Admitting: Family Medicine

## 2021-03-11 DIAGNOSIS — I10 Essential (primary) hypertension: Secondary | ICD-10-CM

## 2021-03-11 NOTE — Telephone Encounter (Signed)
Patient needs an office visit for further refills 

## 2021-04-08 ENCOUNTER — Other Ambulatory Visit: Payer: Self-pay | Admitting: Internal Medicine

## 2021-04-08 DIAGNOSIS — F5101 Primary insomnia: Secondary | ICD-10-CM

## 2021-04-09 NOTE — Telephone Encounter (Signed)
Courtesy refill. Patient needs office visit for further refills. Requested Prescriptions  Pending Prescriptions Disp Refills  . traZODone (DESYREL) 100 MG tablet [Pharmacy Med Name: traZODone HCl 100 MG Oral Tablet] 30 tablet 0    Sig: TAKE 1 TABLET BY MOUTH AT BEDTIME     Psychiatry: Antidepressants - Serotonin Modulator Failed - 04/08/2021  7:59 PM      Failed - Valid encounter within last 6 months    Recent Outpatient Visits          9 months ago Benign hypertension with CKD (chronic kidney disease), stage II   [facility], Netta Neat, DO   12 months ago Annual physical exam   [facility] Smitty Cords, DO   1 year ago Benign hypertension with CKD (chronic kidney disease), stage II   [facility] [place], Netta Neat, DO   1 year ago Alcohol-induced acute pancreatitis without infection or necrosis   [facility] Smitty Cords, DO   1 year ago Annual physical exam   [facility] [place], Netta Neat, DO             Passed - Completed PHQ-2 or PHQ-9 in the last 360 days

## 2021-05-09 ENCOUNTER — Other Ambulatory Visit: Payer: Self-pay | Admitting: Family Medicine

## 2021-05-09 DIAGNOSIS — I10 Essential (primary) hypertension: Secondary | ICD-10-CM

## 2021-05-09 DIAGNOSIS — F5101 Primary insomnia: Secondary | ICD-10-CM

## 2021-05-09 NOTE — Telephone Encounter (Signed)
Requested medications are due for refill today yes  Requested medications are on the active medication list yes  Last refill 04/09/21  Last visit 07/11/20  Future visit scheduled no  Notes to clinic Has already had a curtesy refill and there is no upcoming appointment scheduled. Requested Prescriptions  Pending Prescriptions Disp Refills   traZODone (DESYREL) 100 MG tablet [Pharmacy Med Name: traZODone HCl 100 MG Oral Tablet] 30 tablet 0    Sig: TAKE 1 TABLET BY MOUTH AT BEDTIME     Psychiatry: Antidepressants - Serotonin Modulator Failed - 05/09/2021  3:13 AM      Failed - Valid encounter within last 6 months    Recent Outpatient Visits           10 months ago Benign hypertension with CKD (chronic kidney disease), stage II   [facility] [place], Netta Neat, DO   1 year ago Annual physical exam   [facility] Smitty Cords, DO   1 year ago Benign hypertension with CKD (chronic kidney disease), stage II   [facility] [place], Netta Neat, DO   1 year ago Alcohol-induced acute pancreatitis without infection or necrosis   [facility] [place], Netta Neat, DO   2 years ago Annual physical exam   [facility] [place], Alexander J, DO              Passed - Completed PHQ-2 or PHQ-9 in the last 360 days       amLODipine (NORVASC) 10 MG tablet [Pharmacy Med Name: amLODIPine Besylate 10 MG Oral Tablet] 30 tablet 0    Sig: TAKE 1 TABLET BY MOUTH ONCE DAILY PATIENT  NEEDS  OFFICE  VISIT  FOR  FURTHER  REFILLS     Cardiovascular:  Calcium Channel Blockers Failed - 05/09/2021  3:13 AM      Failed - Valid encounter within last 6 months    Recent Outpatient Visits           10 months ago Benign hypertension with CKD (chronic kidney disease), stage II   [facility] [place], Netta Neat, DO   1 year ago Annual physical exam   [facility]  Smitty Cords, DO   1 year ago Benign hypertension with CKD (chronic kidney disease), stage II   [facility] [place], Netta Neat, DO   1 year ago Alcohol-induced acute pancreatitis without infection or necrosis   [facility] [place], Netta Neat, DO   2 years ago Annual physical exam   [facility] Smitty Cords, DO              Passed - Last BP in normal range    BP Readings from Last 1 Encounters:  07/11/20 126/81

## 2021-05-12 ENCOUNTER — Telehealth: Payer: Self-pay

## 2021-05-12 DIAGNOSIS — F3342 Major depressive disorder, recurrent, in full remission: Secondary | ICD-10-CM

## 2021-05-12 DIAGNOSIS — I129 Hypertensive chronic kidney disease with stage 1 through stage 4 chronic kidney disease, or unspecified chronic kidney disease: Secondary | ICD-10-CM

## 2021-05-12 DIAGNOSIS — Z0000 Encounter for general adult medical examination without abnormal findings: Secondary | ICD-10-CM

## 2021-05-12 DIAGNOSIS — R7309 Other abnormal glucose: Secondary | ICD-10-CM

## 2021-05-12 DIAGNOSIS — F5101 Primary insomnia: Secondary | ICD-10-CM

## 2021-05-12 DIAGNOSIS — N400 Enlarged prostate without lower urinary tract symptoms: Secondary | ICD-10-CM

## 2021-05-12 DIAGNOSIS — E782 Mixed hyperlipidemia: Secondary | ICD-10-CM

## 2021-05-12 NOTE — Telephone Encounter (Signed)
Copied from CRM [id number]. Topic: General - Other >> May 11, 2021  3:41 PM Pawlus, Maxine Glenn A wrote: Reason for CRM: Pt wanted to know if Dr Kirtland Bouchard could place lab orders for him, pt scheduled an OV on Monday 11/21 and wanted to know if he could get labs done on Friday, please advise.

## 2021-05-12 NOTE — Telephone Encounter (Signed)
Sure. Lab orders are in he can come in on Friday 11/18 for labs - he should try to schedule lab only apt to pick a time.  Saralyn Pilar, DO [facility] 05/12/2021, 5:36 PM

## 2021-05-13 NOTE — Telephone Encounter (Signed)
Called and left a message to let me know what time he wants to come in for labs on Friday

## 2021-05-14 ENCOUNTER — Other Ambulatory Visit: Payer: Self-pay

## 2021-05-14 DIAGNOSIS — E782 Mixed hyperlipidemia: Secondary | ICD-10-CM

## 2021-05-14 DIAGNOSIS — N400 Enlarged prostate without lower urinary tract symptoms: Secondary | ICD-10-CM

## 2021-05-14 DIAGNOSIS — R7309 Other abnormal glucose: Secondary | ICD-10-CM

## 2021-05-14 DIAGNOSIS — I129 Hypertensive chronic kidney disease with stage 1 through stage 4 chronic kidney disease, or unspecified chronic kidney disease: Secondary | ICD-10-CM

## 2021-05-14 DIAGNOSIS — Z0000 Encounter for general adult medical examination without abnormal findings: Secondary | ICD-10-CM

## 2021-05-15 ENCOUNTER — Other Ambulatory Visit: Payer: No Typology Code available for payment source

## 2021-05-15 ENCOUNTER — Other Ambulatory Visit: Payer: Self-pay

## 2021-05-16 LAB — LIPID PANEL
Cholesterol: 218 mg/dL — ABNORMAL HIGH (ref <200)
HDL: 67 mg/dL (ref >=40)
LDL Cholesterol (Calc): 134 mg/dL (calc) — ABNORMAL HIGH
Non-HDL Cholesterol (Calc): 151 mg/dL (calc) — ABNORMAL HIGH (ref <130)
Total CHOL/HDL Ratio: 3.3 (calc) (ref <5.0)
Triglycerides: 75 mg/dL (ref <150)

## 2021-05-16 LAB — CBC WITH DIFFERENTIAL/PLATELET
Absolute Monocytes: 617 cells/uL (ref 200–950)
Basophils Absolute: 31 cells/uL (ref 0–200)
Basophils Relative: 0.6 %
Eosinophils Absolute: 133 cells/uL (ref 15–500)
Eosinophils Relative: 2.6 %
HCT: 46.5 % (ref 38.5–50.0)
Hemoglobin: 16 g/dL (ref 13.2–17.1)
Lymphs Abs: 959 cells/uL (ref 850–3900)
MCH: 30.4 pg (ref 27.0–33.0)
MCHC: 34.4 g/dL (ref 32.0–36.0)
MCV: 88.4 fL (ref 80.0–100.0)
MPV: 10.6 fL (ref 7.5–12.5)
Monocytes Relative: 12.1 %
Neutro Abs: 3361 cells/uL (ref 1500–7800)
Neutrophils Relative %: 65.9 %
Platelets: 214 10*3/uL (ref 140–400)
RBC: 5.26 10*6/uL (ref 4.20–5.80)
RDW: 12.8 % (ref 11.0–15.0)
Total Lymphocyte: 18.8 %
WBC: 5.1 10*3/uL (ref 3.8–10.8)

## 2021-05-16 LAB — COMPLETE METABOLIC PANEL WITH GFR
AG Ratio: 1.8 (calc) (ref 1.0–2.5)
ALT: 11 U/L (ref 9–46)
AST: 14 U/L (ref 10–35)
Albumin: 4.4 g/dL (ref 3.6–5.1)
Alkaline phosphatase (APISO): 43 U/L (ref 35–144)
BUN: 15 mg/dL (ref 7–25)
CO2: 30 mmol/L (ref 20–32)
Calcium: 10.1 mg/dL (ref 8.6–10.3)
Chloride: 102 mmol/L (ref 98–110)
Creat: 1.26 mg/dL (ref 0.70–1.30)
Globulin: 2.4 g/dL (calc) (ref 1.9–3.7)
Glucose, Bld: 143 mg/dL — ABNORMAL HIGH (ref 65–99)
Potassium: 4.7 mmol/L (ref 3.5–5.3)
Sodium: 140 mmol/L (ref 135–146)
Total Bilirubin: 0.9 mg/dL (ref 0.2–1.2)
Total Protein: 6.8 g/dL (ref 6.1–8.1)
eGFR: 67 mL/min/{1.73_m2} (ref >=60)

## 2021-05-16 LAB — PSA: PSA: 0.59 ng/mL (ref <=4.00)

## 2021-05-16 LAB — HEMOGLOBIN A1C
Hgb A1c MFr Bld: 5.2 % of total Hgb (ref <5.7)
Mean Plasma Glucose: 103 mg/dL
eAG (mmol/L): 5.7 mmol/L

## 2021-05-18 ENCOUNTER — Other Ambulatory Visit: Payer: Self-pay

## 2021-05-18 ENCOUNTER — Ambulatory Visit: Payer: No Typology Code available for payment source | Admitting: Family Medicine

## 2021-05-18 ENCOUNTER — Encounter: Payer: Self-pay | Admitting: Family Medicine

## 2021-05-18 ENCOUNTER — Other Ambulatory Visit: Payer: Self-pay | Admitting: Family Medicine

## 2021-05-18 VITALS — BP 128/74 | HR 88 | Ht 69.0 in | Wt 162.8 lb

## 2021-05-18 DIAGNOSIS — N400 Enlarged prostate without lower urinary tract symptoms: Secondary | ICD-10-CM

## 2021-05-18 DIAGNOSIS — E782 Mixed hyperlipidemia: Secondary | ICD-10-CM | POA: Diagnosis not present

## 2021-05-18 DIAGNOSIS — F5101 Primary insomnia: Secondary | ICD-10-CM

## 2021-05-18 DIAGNOSIS — F3342 Major depressive disorder, recurrent, in full remission: Secondary | ICD-10-CM | POA: Diagnosis not present

## 2021-05-18 DIAGNOSIS — F1021 Alcohol dependence, in remission: Secondary | ICD-10-CM

## 2021-05-18 DIAGNOSIS — I129 Hypertensive chronic kidney disease with stage 1 through stage 4 chronic kidney disease, or unspecified chronic kidney disease: Secondary | ICD-10-CM | POA: Diagnosis not present

## 2021-05-18 DIAGNOSIS — N182 Chronic kidney disease, stage 2 (mild): Secondary | ICD-10-CM

## 2021-05-18 DIAGNOSIS — Z23 Encounter for immunization: Secondary | ICD-10-CM

## 2021-05-18 DIAGNOSIS — R7309 Other abnormal glucose: Secondary | ICD-10-CM

## 2021-05-18 DIAGNOSIS — Z0000 Encounter for general adult medical examination without abnormal findings: Secondary | ICD-10-CM

## 2021-05-18 DIAGNOSIS — F419 Anxiety disorder, unspecified: Secondary | ICD-10-CM

## 2021-05-18 MED ORDER — ALPRAZOLAM 1 MG PO TABS: ORAL_TABLET | ORAL | 2 refills | Status: DC

## 2021-05-18 NOTE — Assessment & Plan Note (Signed)
LDL >130 The 10-year ASCVD risk score (Arnett DK, et al., 2019) is: 6.1%  Plan: 1. Not indicated for statin therapy 2. Encourage improved lifestyle - low carb/cholesterol, reduce portion size, continue improving regular exercise  Check lipid yearly

## 2021-05-18 NOTE — Progress Notes (Signed)
Subjective:    Patient ID: Brett Huber, male    DOB: 04/28/1966, 55 y.o.   MRN: [id number]  Brett Huber is a 55 y.o. male presenting on 05/18/2021 for Insomnia   HPI  CHRONIC HTN CKD II Recent refill on BP meds was out for 1 week Improving hydration and lifestyle - Today patient reports doing well - Home BP improved Current Meds - Amlodipine $RemoveBefo'10mg'ftkWroxhKCW$  daily Reports good compliance, took meds today. Tolerating well, w/o complaints. Denies CP, dyspnea, HA, edema, dizziness / lightheadedness   INSOMNIA, Chronic Anxiety Major Depression recurrent in remission See prior notes, has been stable on current med management now. He had been doing very well. Denies depression. Anxiety has been controlled. Sleep has improved on medicines - taking Trazodone $RemoveBeforeDE'100mg'faYjsWvEetUkzqN$  nightly - Taking Xanax $RemoveBefo'1mg'jBDVUomtjfg$  = one whole $RemoveB'1mg'pwZdaWmO$  pill 4-5 days a week before bed for insomnia on work days, and occasionally takes a half pill for a few nights before bed if weekend - Needs refill Xanax today  HYPERLIPIDEMIA: - Reports no concerns. Last lipid panel 04/2021, controlled except mild elevated LDL 130s Not on medication    Alcohol Dependence in Remission Currently alcohol free   Health Maintenance:  PSA 0.59 negative.  Future Shingrix.  Due for Flu Shot, will receive today   Due for updated COVID19 booster when ready at pharmacy  Depression screen [facility] 2/9 05/18/2021 07/11/2020 04/11/2020  Decreased Interest 0 0 0  Down, Depressed, Hopeless 0 0 0  PHQ - 2 Score 0 0 0  Altered sleeping 1 0 0  Tired, decreased energy 1 1 0  Change in appetite 0 0 0  Feeling bad or failure about yourself  0 0 0  Trouble concentrating 0 0 0  Moving slowly or fidgety/restless 0 0 0  Suicidal thoughts 0 0 0  PHQ-9 Score 2 1 0  Difficult doing work/chores Not difficult at all Not difficult at all Not difficult at all  Some recent data might be hidden    Past Medical History:  Diagnosis Date   Anxiety    Chronic  pain    DDD (degenerative disc disease)    Hyperlipidemia    Hypertension    Insomnia    Past Surgical History:  Procedure Laterality Date   CERVICAL FUSION  2004   CHOLECYSTECTOMY  2007   neck and disc replacement     SHOULDER ARTHROSCOPY WITH ROTATOR CUFF REPAIR AND SUBACROMIAL DECOMPRESSION Left 01/29/2013   Procedure: LEFT SHOULDER ARTHROSCOPY WITH ROTATOR CUFF REPAIR AND SUBACROMIAL DECOMPRESSION DISTAL CLAVICLE RESECTION;  Surgeon: Nita Sells, MD;  Location: [place];  Service: Orthopedics;  Laterality: Left;   Social History   Socioeconomic History   Marital status: Married    Spouse name: Not on file   Number of children: Not on file   Years of education: Not on file   Highest education level: Not on file  Occupational History   Not on file  Tobacco Use   Smoking status: Never   Smokeless tobacco: Current    Types: Chew  Substance and Sexual Activity   Alcohol use: Yes    Comment: occ   Drug use: No   Sexual activity: Not on file  Other Topics Concern   Not on file  Social History Narrative   Not on file   Social Determinants of Health   Financial Resource Strain: Not on file  Food Insecurity: Not on file  Transportation Needs: Not on file  Physical Activity:  Not on file  Stress: Not on file  Social Connections: Not on file  Intimate Partner Violence: Not on file   Family History  Problem Relation Age of Onset   Diabetes Mother    Stroke Father    Prostate cancer Neg Hx    Kidney cancer Neg Hx    Bladder Cancer Neg Hx    Current Outpatient Medications on File Prior to Visit  Medication Sig   amLODipine (NORVASC) 10 MG tablet TAKE 1 TABLET BY MOUTH ONCE DAILY PATIENT  NEEDS  OFFICE  VISIT  FOR  FURTHER  REFILLS   Multiple Vitamin (MULTIVITAMIN WITH MINERALS) TABS tablet Take 1 tablet by mouth daily.   traZODone (DESYREL) 100 MG tablet TAKE 1 TABLET BY MOUTH AT BEDTIME   triamcinolone ointment (KENALOG) 0.5 % Apply 1  application topically 2 (two) times daily. 1-2 weeks, for hands.   acetaminophen (TYLENOL) 325 MG tablet Take 2 tablets (650 mg total) by mouth every 6 (six) hours as needed for mild pain, moderate pain or headache. (Patient not taking: Reported on 07/11/2020)   No current facility-administered medications on file prior to visit.    Review of Systems  Constitutional:  Negative for activity change, appetite change, chills, diaphoresis, fatigue and fever.  HENT:  Negative for congestion and hearing loss.   Eyes:  Negative for visual disturbance.  Respiratory:  Negative for cough, chest tightness, shortness of breath and wheezing.   Cardiovascular:  Negative for chest pain, palpitations and leg swelling.  Gastrointestinal:  Negative for abdominal pain, constipation, diarrhea, nausea and vomiting.  Genitourinary:  Negative for dysuria, frequency and hematuria.  Musculoskeletal:  Negative for arthralgias and neck pain.  Skin:  Negative for rash.  Neurological:  Negative for dizziness, weakness, light-headedness, numbness and headaches.  Hematological:  Negative for adenopathy.  Psychiatric/Behavioral:  Negative for behavioral problems, dysphoric mood and sleep disturbance.   Per HPI unless specifically indicated above     Objective:    BP 128/74 (BP Location: Left Arm, Cuff Size: Normal)   Pulse 88   Ht $R'5\' 9"'gC$  (1.753 m)   Wt 162 lb 12.8 oz (73.8 kg)   SpO2 98%   BMI 24.04 kg/m   Wt Readings from Last 3 Encounters:  05/18/21 162 lb 12.8 oz (73.8 kg)  07/11/20 173 lb (78.5 kg)  04/11/20 167 lb 9.6 oz (76 kg)    Physical Exam Vitals and nursing note reviewed.  Constitutional:      General: He is not in acute distress.    Appearance: He is well-developed. He is not diaphoretic.     Comments: Well-appearing, comfortable, cooperative  HENT:     Head: Normocephalic and atraumatic.  Eyes:     General:        Right eye: No discharge.        Left eye: No discharge.      Conjunctiva/sclera: Conjunctivae normal.     Pupils: Pupils are equal, round, and reactive to light.  Neck:     Thyroid: No thyromegaly.  Cardiovascular:     Rate and Rhythm: Normal rate and regular rhythm.     Pulses: Normal pulses.     Heart sounds: Normal heart sounds. No murmur heard. Pulmonary:     Effort: Pulmonary effort is normal. No respiratory distress.     Breath sounds: Normal breath sounds. No wheezing or rales.  Abdominal:     General: Bowel sounds are normal. There is no distension.     Palpations: Abdomen is  soft. There is no mass.     Tenderness: There is no abdominal tenderness.  Musculoskeletal:        General: No tenderness. Normal range of motion.     Cervical back: Normal range of motion and neck supple.     Comments: Upper / Lower Extremities: - Normal muscle tone, strength bilateral upper extremities 5/5, lower extremities 5/5  Lymphadenopathy:     Cervical: No cervical adenopathy.  Skin:    General: Skin is warm and dry.     Findings: No erythema or rash.  Neurological:     Mental Status: He is alert and oriented to person, place, and time.     Comments: Distal sensation intact to light touch all extremities  Psychiatric:        Mood and Affect: Mood normal.        Behavior: Behavior normal.        Thought Content: Thought content normal.     Comments: Well groomed, good eye contact, normal speech and thoughts   Results for orders placed or performed in visit on 05/14/21  PSA  Result Value Ref Range   PSA 0.59 < OR = 4.00 ng/mL  Hemoglobin A1c  Result Value Ref Range   Hgb A1c MFr Bld 5.2 <5.7 % of total Hgb   Mean Plasma Glucose 103 mg/dL   eAG (mmol/L) 5.7 mmol/L  CBC with Differential/Platelet  Result Value Ref Range   WBC 5.1 3.8 - 10.8 Thousand/uL   RBC 5.26 4.20 - 5.80 Million/uL   Hemoglobin 16.0 13.2 - 17.1 g/dL   HCT 46.5 38.5 - 50.0 %   MCV 88.4 80.0 - 100.0 fL   MCH 30.4 27.0 - 33.0 pg   MCHC 34.4 32.0 - 36.0 g/dL   RDW 12.8 11.0  - 15.0 %   Platelets 214 140 - 400 Thousand/uL   MPV 10.6 7.5 - 12.5 fL   Neutro Abs 3,361 1,500 - 7,800 cells/uL   Lymphs Abs 959 850 - 3,900 cells/uL   Absolute Monocytes 617 200 - 950 cells/uL   Eosinophils Absolute 133 15 - 500 cells/uL   Basophils Absolute 31 0 - 200 cells/uL   Neutrophils Relative % 65.9 %   Total Lymphocyte 18.8 %   Monocytes Relative 12.1 %   Eosinophils Relative 2.6 %   Basophils Relative 0.6 %  Lipid panel  Result Value Ref Range   Cholesterol 218 (H) <200 mg/dL   HDL 67 > OR = 40 mg/dL   Triglycerides 75 <150 mg/dL   LDL Cholesterol (Calc) 134 (H) mg/dL (calc)   Total CHOL/HDL Ratio 3.3 <5.0 (calc)   Non-HDL Cholesterol (Calc) 151 (H) <130 mg/dL (calc)  COMPLETE METABOLIC PANEL WITH GFR  Result Value Ref Range   Glucose, Bld 143 (H) 65 - 99 mg/dL   BUN 15 7 - 25 mg/dL   Creat 1.26 0.70 - 1.30 mg/dL   eGFR 67 > OR = 60 mL/min/1.11m2   BUN/Creatinine Ratio NOT APPLICABLE 6 - 22 (calc)   Sodium 140 135 - 146 mmol/L   Potassium 4.7 3.5 - 5.3 mmol/L   Chloride 102 98 - 110 mmol/L   CO2 30 20 - 32 mmol/L   Calcium 10.1 8.6 - 10.3 mg/dL   Total Protein 6.8 6.1 - 8.1 g/dL   Albumin 4.4 3.6 - 5.1 g/dL   Globulin 2.4 1.9 - 3.7 g/dL (calc)   AG Ratio 1.8 1.0 - 2.5 (calc)   Total Bilirubin 0.9 0.2 - 1.2 mg/dL  Alkaline phosphatase (APISO) 43 35 - 144 U/L   AST 14 10 - 35 U/L   ALT 11 9 - 46 U/L      Assessment & Plan:   Problem List Items Addressed This Visit     Recurrent major depression in full remission ([place])    Remission Continue current therapy      Relevant Medications   ALPRAZolam (XANAX) 1 MG tablet   Primary insomnia    Controlled insomnia On Trazodone, Xanax  Plan: Refilled Xanax $RemoveBefore'1mg'DawtdtfrOgeGh$  at night QHS PRN #30 +2 refills Continue Trazodone      Relevant Medications   ALPRAZolam (XANAX) 1 MG tablet   Hyperlipidemia    LDL >130 The 10-year ASCVD risk score (Arnett DK, et al., 2019) is: 6.1%  Plan: 1. Not indicated for statin  therapy 2. Encourage improved lifestyle - low carb/cholesterol, reduce portion size, continue improving regular exercise  Check lipid yearly      BPH (benign prostatic hyperplasia)    Controlled PSA normal Off medication still      Benign hypertension with CKD (chronic kidney disease), stage II - Primary    Controlled HTN - Home BP readings normal  Improved hydration. Improved creatinine, CKD-II range now    Plan:  1. Continue current BP regimen - Amlodipine $RemoveBefo'10mg'FLoOdAwhlZB$  daily 2. Encourage improved lifestyle - low sodium diet, regular exercise 3. Continue monitor BP outside office, bring readings to next visit, if persistently >140/90 or new symptoms notify office sooner  Improve hydration monitor Cr level      Anxiety   Relevant Medications   ALPRAZolam (XANAX) 1 MG tablet   Alcohol dependence in remission ([place])   Other Visit Diagnoses     Needs flu shot       Relevant Orders   Flu Vaccine QUAD 81mo+IM (Fluarix, Fluzone & Alfiuria Quad PF) (Completed)       Updated Health Maintenance information Reviewed recent lab results with patient Encouraged improvement to lifestyle with diet and exercise Goal of weight loss   Meds ordered this encounter  Medications   ALPRAZolam (XANAX) 1 MG tablet    Sig: TAKE 1 TABLET BY MOUTH AT BEDTIME AS NEEDED FOR ANXIETY    Dispense:  30 tablet    Refill:  2      Follow up plan: Return in about 1 year (around 05/18/2022) for 1 year fasting lab only then 1 week later Annual Physical.   Future labs 04/2022  Nobie Putnam, [facility] 05/18/2021, 9:33 AM

## 2021-05-18 NOTE — Assessment & Plan Note (Signed)
Controlled HTN - Home BP readings normal  Improved hydration. Improved creatinine, CKD-II range now    Plan:  1. Continue current BP regimen - Amlodipine 10mg  daily 2. Encourage improved lifestyle - low sodium diet, regular exercise 3. Continue monitor BP outside office, bring readings to next visit, if persistently >140/90 or new symptoms notify office sooner  Improve hydration monitor Cr level

## 2021-05-18 NOTE — Patient Instructions (Addendum)
Thank you for coming to the office today.  Recommend COVID19 booster updated omicron variant when ready at pharmacy.  Flu Shot today.  Recommend to start taking Tylenol Extra Strength 500mg  tabs - take 1 to 2 tabs per dose (max 1000mg ) every 6-8 hours for pain (take regularly, don't skip a dose for next 7 days), max 24 hour daily dose is 6 tablets or 3000mg . In the future you can repeat the same everyday Tylenol course for 1-2 weeks at a time.   DUE for FASTING BLOOD WORK (no food or drink after midnight before the lab appointment, only water or coffee without cream/sugar on the morning of)  SCHEDULE "Lab Only" visit in the morning at the clinic for lab draw in 1 YEAR  - Make sure Lab Only appointment is at about 1 week before your next appointment, so that results will be available  For Lab Results, once available within 2-3 days of blood draw, you can can log in to MyChart online to view your results and a brief explanation. Also, we can discuss results at next follow-up visit.   Please schedule a Follow-up Appointment to: Return in about 1 year (around 05/18/2022) for 1 year fasting lab only then 1 week later Annual Physical.  If you have any other questions or concerns, please feel free to call the office or send a message through MyChart. You may also schedule an earlier appointment if necessary.  Additionally, you may be receiving a survey about your experience at our office within a few days to 1 week by e-mail or mail. We value your feedback.  , DO [facility], 05/20/2022

## 2021-05-18 NOTE — Assessment & Plan Note (Signed)
Controlled insomnia On Trazodone, Xanax  Plan: Refilled Xanax 1mg  at night QHS PRN #30 +2 refills Continue Trazodone

## 2021-05-18 NOTE — Assessment & Plan Note (Addendum)
Remission Continue current therapy 

## 2021-05-18 NOTE — Assessment & Plan Note (Signed)
Controlled PSA normal Off medication still 

## 2021-08-24 ENCOUNTER — Other Ambulatory Visit: Payer: Self-pay | Admitting: Family Medicine

## 2021-08-24 DIAGNOSIS — F5101 Primary insomnia: Secondary | ICD-10-CM

## 2021-08-25 NOTE — Telephone Encounter (Signed)
Requested medications are due for refill today.  yes  Requested medications are on the active medications list.  yes  Last refill. 05/18/2021 330 2 refills  Future visit scheduled.   no  Notes to clinic.  Refill not delegated.    Requested Prescriptions  Pending Prescriptions Disp Refills   ALPRAZolam (XANAX) 1 MG tablet [Pharmacy Med Name: ALPRAZolam 1 MG Oral Tablet] 30 tablet 0    Sig: TAKE 1 TABLET BY MOUTH AT BEDTIME AS NEEDED FOR ANXIETY     Not Delegated - Psychiatry: Anxiolytics/Hypnotics 2 Failed - 08/24/2021  6:55 PM      Failed - This refill cannot be delegated      Failed - Urine Drug Screen completed in last 360 days      Passed - Patient is not pregnant      Passed - Valid encounter within last 6 months    Recent Outpatient Visits           3 months ago Benign hypertension with CKD (chronic kidney disease), stage II   [facility] [place], Devonne Doughty, DO   1 year ago Benign hypertension with CKD (chronic kidney disease), stage II   [place], DO   1 year ago Annual physical exam   [facility] Olin Hauser, DO   1 year ago Benign hypertension with CKD (chronic kidney disease), stage II   [place], DO   2 years ago Alcohol-induced acute pancreatitis without infection or necrosis   [place], Devonne Doughty, DO

## 2021-11-04 ENCOUNTER — Other Ambulatory Visit: Payer: Self-pay | Admitting: Family Medicine

## 2021-11-04 DIAGNOSIS — I10 Essential (primary) hypertension: Secondary | ICD-10-CM

## 2021-11-04 DIAGNOSIS — F5101 Primary insomnia: Secondary | ICD-10-CM

## 2021-11-05 NOTE — Telephone Encounter (Signed)
Requested Prescriptions  ?Pending Prescriptions Disp Refills  ?? traZODone (DESYREL) 100 MG tablet [Pharmacy Med Name: traZODone HCl 100 MG Oral Tablet] 30 tablet 0  ?  Sig: TAKE 1 TABLET BY MOUTH AT BEDTIME  ?  ? Psychiatry: Antidepressants - Serotonin Modulator Passed - 11/04/2021  3:23 PM  ?  ?  Passed - Completed PHQ-2 or PHQ-9 in the last 360 days  ?  ?  Passed - Valid encounter within last 6 months  ?  Recent Outpatient Visits   ?      ? 5 months ago Benign hypertension with CKD (chronic kidney disease), stage II  ? [place], DO  ? 1 year ago Benign hypertension with CKD (chronic kidney disease), stage II  ? [place], DO  ? 1 year ago Annual physical exam  ? [place], DO  ? 1 year ago Benign hypertension with CKD (chronic kidney disease), stage II  ? [place], DO  ? 2 years ago Alcohol-induced acute pancreatitis without infection or necrosis  ? [place], DO  ?  ?  ? ?  ?  ?  ?? amLODipine (NORVASC) 10 MG tablet [Pharmacy Med Name: amLODIPine Besylate 10 MG Oral Tablet] 30 tablet 0  ?  Sig: TAKE 1 TABLET BY MOUTH ONCE DAILY . APPOINTMENT REQUIRED FOR FUTURE REFILLS  ?  ? Cardiovascular: Calcium Channel Blockers 2 Passed - 11/04/2021  3:23 PM  ?  ?  Passed - Last BP in normal range  ?  BP Readings from Last 1 Encounters:  ?05/18/21 128/74  ?   ?  ?  Passed - Last Heart Rate in normal range  ?  Pulse Readings from Last 1 Encounters:  ?05/18/21 88  ?   ?  ?  Passed - Valid encounter within last 6 months  ?  Recent Outpatient Visits   ?      ? 5 months ago Benign hypertension with CKD (chronic kidney disease), stage II  ? [place], DO  ? 1 year ago Benign hypertension with CKD (chronic kidney disease), stage II  ? [place], DO  ? 1 year ago Annual physical exam  ? [place], DO  ? 1 year ago Benign hypertension with CKD (chronic kidney disease), stage II  ? [place], DO  ? 2 years ago Alcohol-induced acute pancreatitis without infection or necrosis  ? [place], DO  ?  ?  ? ?  ?  ?  ? ? ?

## 2021-11-07 ENCOUNTER — Other Ambulatory Visit: Payer: Self-pay | Admitting: Family Medicine

## 2021-11-07 DIAGNOSIS — I10 Essential (primary) hypertension: Secondary | ICD-10-CM

## 2021-11-07 DIAGNOSIS — F5101 Primary insomnia: Secondary | ICD-10-CM

## 2021-11-10 NOTE — Telephone Encounter (Signed)
Requested medication (s) are due for refill today:   No for both ? ?Requested medication (s) are on the active medication list:   Yes for both ? ?Future visit scheduled:   No   Called to schedule appt however doesn't have an answering machine, unable to leave message ? ? ?Last ordered: Trazodone and amlodipine 11/05/2021 #30, 0 refills ? ?Returned because 30 day courtesy refill has been given.   Needs an appt.  Provider to review for further refills.  ? ?Requested Prescriptions  ?Pending Prescriptions Disp Refills  ? traZODone (DESYREL) 100 MG tablet [Pharmacy Med Name: traZODone HCl 100 MG Oral Tablet] 90 tablet 0  ?  Sig: TAKE 1 TABLET BY MOUTH AT BEDTIME  ?  ? Psychiatry: Antidepressants - Serotonin Modulator Passed - 11/07/2021 11:45 AM  ?  ?  Passed - Completed PHQ-2 or PHQ-9 in the last 360 days  ?  ?  Passed - Valid encounter within last 6 months  ?  Recent Outpatient Visits   ? ?      ? 5 months ago Benign hypertension with CKD (chronic kidney disease), stage II  ? [place], DO  ? 1 year ago Benign hypertension with CKD (chronic kidney disease), stage II  ? [place], DO  ? 1 year ago Annual physical exam  ? [place], DO  ? 1 year ago Benign hypertension with CKD (chronic kidney disease), stage II  ? [place], DO  ? 2 years ago Alcohol-induced acute pancreatitis without infection or necrosis  ? [place], DO  ? ?  ?  ? ? ?  ?  ?  ? amLODipine (NORVASC) 10 MG tablet [Pharmacy Med Name: amLODIPine Besylate 10 MG Oral Tablet] 90 tablet 0  ?  Sig: TAKE 1 TABLET BY MOUTH ONCE DAILY . APPOINTMENT REQUIRED FOR FUTURE REFILLS  ?  ? Cardiovascular: Calcium Channel Blockers 2 Passed - 11/07/2021 11:45 AM  ?  ?  Passed - Last BP in normal range  ?  BP Readings from Last 1 Encounters:  ?05/18/21 128/74  ?   ?   ?  Passed - Last Heart Rate in normal range  ?  Pulse Readings from Last 1 Encounters:  ?05/18/21 88  ?   ?  ?  Passed - Valid encounter within last 6 months  ?  Recent Outpatient Visits   ? ?      ? 5 months ago Benign hypertension with CKD (chronic kidney disease), stage II  ? [place], DO  ? 1 year ago Benign hypertension with CKD (chronic kidney disease), stage II  ? [place], DO  ? 1 year ago Annual physical exam  ? [place], DO  ? 1 year ago Benign hypertension with CKD (chronic kidney disease), stage II  ? [place], DO  ? 2 years ago Alcohol-induced acute pancreatitis without infection or necrosis  ? [place], DO  ? ?  ?  ? ? ?  ?  ?  ? ?

## 2021-11-10 NOTE — Telephone Encounter (Signed)
Needs appointment

## 2021-12-04 ENCOUNTER — Other Ambulatory Visit: Payer: Self-pay | Admitting: Family Medicine

## 2021-12-04 DIAGNOSIS — F5101 Primary insomnia: Secondary | ICD-10-CM

## 2021-12-04 NOTE — Telephone Encounter (Signed)
Requested medication (s) are due for refill today - yes  Requested medication (s) are on the active medication list -yes  Future visit scheduled -no  Last refill: 08/25/21 #30 2RF  Notes to clinic: non delegated Rx  Requested Prescriptions  Pending Prescriptions Disp Refills   ALPRAZolam (XANAX) 1 MG tablet [Pharmacy Med Name: ALPRAZolam 1 MG Oral Tablet] 30 tablet 0    Sig: TAKE 1 TABLET BY MOUTH AT BEDTIME AS NEEDED FOR ANXIETY     Not Delegated - Psychiatry: Anxiolytics/Hypnotics 2 Failed - 12/04/2021  8:58 AM      Failed - This refill cannot be delegated      Failed - Urine Drug Screen completed in last 360 days      Failed - Valid encounter within last 6 months    Recent Outpatient Visits           6 months ago Benign hypertension with CKD (chronic kidney disease), stage II   [facility] Olin Hauser, DO   1 year ago Benign hypertension with CKD (chronic kidney disease), stage II   [place], Devonne Doughty, DO   1 year ago Annual physical exam   [facility] Olin Hauser, DO   1 year ago Benign hypertension with CKD (chronic kidney disease), stage II   [place], Devonne Doughty, DO   2 years ago Alcohol-induced acute pancreatitis without infection or necrosis   [place], Devonne Doughty, DO              Passed - Patient is not pregnant         Requested Prescriptions  Pending Prescriptions Disp Refills   ALPRAZolam (XANAX) 1 MG tablet [Pharmacy Med Name: ALPRAZolam 1 MG Oral Tablet] 30 tablet 0    Sig: TAKE 1 TABLET BY MOUTH AT BEDTIME AS NEEDED FOR ANXIETY     Not Delegated - Psychiatry: Anxiolytics/Hypnotics 2 Failed - 12/04/2021  8:58 AM      Failed - This refill cannot be delegated      Failed - Urine Drug Screen completed in last 360 days      Failed - Valid encounter within last 6 months    Recent Outpatient Visits            6 months ago Benign hypertension with CKD (chronic kidney disease), stage II   [facility] [place], Devonne Doughty, DO   1 year ago Benign hypertension with CKD (chronic kidney disease), stage II   [place], DO   1 year ago Annual physical exam   [facility] Olin Hauser, DO   1 year ago Benign hypertension with CKD (chronic kidney disease), stage II   [place], DO   2 years ago Alcohol-induced acute pancreatitis without infection or necrosis   [place], Devonne Doughty, [place] - Patient is not pregnant

## 2022-03-09 ENCOUNTER — Other Ambulatory Visit: Payer: Self-pay | Admitting: Family Medicine

## 2022-03-09 DIAGNOSIS — F5101 Primary insomnia: Secondary | ICD-10-CM

## 2022-03-09 DIAGNOSIS — I10 Essential (primary) hypertension: Secondary | ICD-10-CM

## 2022-03-10 NOTE — Telephone Encounter (Signed)
Needs appt- Called pt  but unable to LM - sent pt note in MyChart to make appt.   Meds needs refilled Amlodipine 11/10/21 #90  Alprazolam 12/04/21 #30 2 RF  Trazofone: 03/10/22 #90

## 2022-05-14 ENCOUNTER — Ambulatory Visit (INDEPENDENT_AMBULATORY_CARE_PROVIDER_SITE_OTHER): Payer: BC Managed Care – PPO | Admitting: Family Medicine

## 2022-05-14 ENCOUNTER — Encounter: Payer: Self-pay | Admitting: Family Medicine

## 2022-05-14 VITALS — BP 132/72 | HR 86 | Ht 69.0 in | Wt 149.0 lb

## 2022-05-14 DIAGNOSIS — N1831 Chronic kidney disease, stage 3a: Secondary | ICD-10-CM

## 2022-05-14 DIAGNOSIS — Z23 Encounter for immunization: Secondary | ICD-10-CM

## 2022-05-14 DIAGNOSIS — Z0000 Encounter for general adult medical examination without abnormal findings: Secondary | ICD-10-CM

## 2022-05-14 DIAGNOSIS — I10 Essential (primary) hypertension: Secondary | ICD-10-CM

## 2022-05-14 DIAGNOSIS — F1020 Alcohol dependence, uncomplicated: Secondary | ICD-10-CM

## 2022-05-14 DIAGNOSIS — F5101 Primary insomnia: Secondary | ICD-10-CM

## 2022-05-14 DIAGNOSIS — F3342 Major depressive disorder, recurrent, in full remission: Secondary | ICD-10-CM

## 2022-05-14 DIAGNOSIS — N400 Enlarged prostate without lower urinary tract symptoms: Secondary | ICD-10-CM | POA: Diagnosis not present

## 2022-05-14 DIAGNOSIS — I129 Hypertensive chronic kidney disease with stage 1 through stage 4 chronic kidney disease, or unspecified chronic kidney disease: Secondary | ICD-10-CM

## 2022-05-14 DIAGNOSIS — E782 Mixed hyperlipidemia: Secondary | ICD-10-CM | POA: Diagnosis not present

## 2022-05-14 DIAGNOSIS — N182 Chronic kidney disease, stage 2 (mild): Secondary | ICD-10-CM

## 2022-05-14 MED ORDER — AMLODIPINE BESYLATE 10 MG PO TABS: 10.0000 mg | ORAL_TABLET | Freq: Every day | ORAL | 3 refills | Status: DC

## 2022-05-14 MED ORDER — TRAZODONE HCL 100 MG PO TABS: 100.0000 mg | ORAL_TABLET | Freq: Every day | ORAL | 3 refills | Status: DC

## 2022-05-14 MED ORDER — ALPRAZOLAM 1 MG PO TABS: ORAL_TABLET | ORAL | 2 refills | Status: DC

## 2022-05-14 NOTE — Progress Notes (Signed)
Subjective:    Patient ID: Brett Huber, male    DOB: Nov 09, 1965, 56 y.o.   MRN: [id number]  Brett Huber is a 56 y.o. male presenting on 05/14/2022 for Hypertension, Insomnia, and Annual Exam   HPI  Here for Annual Physical and future labs.  Unintentional Weight loss Eating a lot almonds He is keeping fluids regularly water and gatorade Normally has small amount breakfast or lunch, and usually eats more later PM  CHRONIC HTN CKD II Improving hydration and lifestyle - Today patient reports doing well - Home BP improved Current Meds - Amlodipine 74m daily Reports good compliance, took meds today. Tolerating well, w/o complaints. Denies CP, dyspnea, HA, edema, dizziness / lightheadedness   INSOMNIA, Chronic Anxiety Major Depression recurrent in remission See prior notes, has been stable on current med management now. He had been doing very well. Denies depression. Anxiety has been controlled. Sleep has improved on medicines - taking Trazodone 1070mnightly - Taking Xanax 63m33m one whole 63mg42mll 4-5 days a week before bed for insomnia on work days, and occasionally takes a half pill for a few nights before bed if weekend - Needs refill Xanax today   HYPERLIPIDEMIA: - Reports no concerns. Last lipid panel 04/2021, controlled except mild elevated LDL 130s Not on medication Due for re order   Alcohol Dependence uncomplicated Resumed drinking, small amount 2-3 drinks 1-2 times a week now, not every day     Health Maintenance:   PSA 0.59 negative.   Future Shingrix.   Due for Flu Shot, will receive today       05/14/2022   10:52 AM 05/18/2021    9:19 AM 07/11/2020    9:04 AM  Depression screen PHQ 2/9  Decreased Interest 0 0 0  Down, Depressed, Hopeless 0 0 0  PHQ - 2 Score 0 0 0  Altered sleeping 0 1 0  Tired, decreased energy _0 Change in appetite 1 0 0  Feeling bad or failure about yourself  0 0 0  Trouble concentrating 0 0 0  Moving  slowly or fidgety/restless 0 0 0  Suicidal thoughts 0 0 0  PHQ-9 Score _1 Difficult doing work/chores Not difficult at all Not difficult at all Not difficult at all    Past Medical History:  Diagnosis Date   Anxiety    Chronic pain    DDD (degenerative disc disease)    Hyperlipidemia    Hypertension    Insomnia    Past Surgical History:  Procedure Laterality Date   CERVICAL FUSION  2004   CHOLECYSTECTOMY  2007   neck and disc replacement     SHOULDER ARTHROSCOPY WITH ROTATOR CUFF REPAIR AND SUBACROMIAL DECOMPRESSION Left 01/29/2013   Procedure: LEFT SHOULDER ARTHROSCOPY WITH ROTATOR CUFF REPAIR AND SUBACROMIAL DECOMPRESSION DISTAL CLAVICLE RESECTION;  Surgeon: JustNita Sells;  [facility];  Laterality: Left;   Social History   Socioeconomic History   Marital status: Married    Spouse name: Not on file   Number of children: Not on file   Years of education: Not on file   Highest education level: Not on file  Occupational History   Not on file  Tobacco Use   Smoking status: Never   Smokeless tobacco: Current    Types: Chew  Substance and Sexual Activity   Alcohol use: Yes    Comment: occ   Drug use: No  Sexual activity: Not on file  Other Topics Concern   Not on file  Social History Narrative   Not on file   Social Determinants of Health   Financial Resource Strain: Not on file  Food Insecurity: Not on file  Transportation Needs: Not on file  Physical Activity: Not on file  Stress: Not on file  Social Connections: Not on file  Intimate Partner Violence: Not on file   Family History  Problem Relation Age of Onset   Diabetes Mother    Stroke Father    Prostate cancer Neg Hx    Kidney cancer Neg Hx    Bladder Cancer Neg Hx    Current Outpatient Medications on File Prior to Visit  Medication Sig   Multiple Vitamin (MULTIVITAMIN WITH MINERALS) TABS tablet Take 1 tablet by mouth daily.    triamcinolone ointment (KENALOG) 0.5 % Apply 1 application topically 2 (two) times daily. 1-2 weeks, for hands.   acetaminophen (TYLENOL) 325 MG tablet Take 2 tablets (650 mg total) by mouth every 6 (six) hours as needed for mild pain, moderate pain or headache. (Patient not taking: Reported on 07/11/2020)   No current facility-administered medications on file prior to visit.    Review of Systems  Constitutional:  Negative for activity change, appetite change, chills, diaphoresis, fatigue and fever.  HENT:  Negative for congestion and hearing loss.   Eyes:  Negative for visual disturbance.  Respiratory:  Negative for cough, chest tightness, shortness of breath and wheezing.   Cardiovascular:  Negative for chest pain, palpitations and leg swelling.  Gastrointestinal:  Negative for abdominal pain, constipation, diarrhea, nausea and vomiting.  Genitourinary:  Negative for dysuria, frequency and hematuria.  Musculoskeletal:  Negative for arthralgias and neck pain.  Skin:  Negative for rash.  Neurological:  Negative for dizziness, weakness, light-headedness, numbness and headaches.  Hematological:  Negative for adenopathy.  Psychiatric/Behavioral:  Negative for behavioral problems, dysphoric mood and sleep disturbance.    Per HPI unless specifically indicated above      Objective:    BP 132/72   Pulse 86   Ht _0  (1.753 m)   Wt 149 lb (67.6 kg)   SpO2 98%   BMI 22.00 kg/m   Wt Readings from Last 3 Encounters:  05/14/22 149 lb (67.6 kg)  05/18/21 162 lb 12.8 oz (73.8 kg)  07/11/20 173 lb (78.5 kg)    Physical Exam Vitals and nursing note reviewed.  Constitutional:      General: He is not in acute distress.    Appearance: He is well-developed. He is not diaphoretic.     Comments: Well-appearing, comfortable, cooperative  HENT:     Head: Normocephalic and atraumatic.  Eyes:     General:        Right eye: No discharge.        Left eye: No discharge.     Conjunctiva/sclera:  Conjunctivae normal.     Pupils: Pupils are equal, round, and reactive to light.  Neck:     Thyroid: No thyromegaly.     Vascular: No carotid bruit.  Cardiovascular:     Rate and Rhythm: Normal rate and regular rhythm.     Pulses: Normal pulses.     Heart sounds: Normal heart sounds. No murmur heard. Pulmonary:     Effort: Pulmonary effort is normal. No respiratory distress.     Breath sounds: Normal breath sounds. No wheezing or rales.  Abdominal:     General: Bowel sounds are normal. There  is no distension.     Palpations: Abdomen is soft. There is no mass.     Tenderness: There is no abdominal tenderness.  Musculoskeletal:        General: No tenderness. Normal range of motion.     Cervical back: Normal range of motion and neck supple.     Right lower leg: No edema.     Left lower leg: No edema.     Comments: Upper / Lower Extremities: - Normal muscle tone, strength bilateral upper extremities 5/5, lower extremities 5/5  Lymphadenopathy:     Cervical: No cervical adenopathy.  Skin:    General: Skin is warm and dry.     Findings: No erythema or rash.  Neurological:     Mental Status: He is alert and oriented to person, place, and time.     Comments: Distal sensation intact to light touch all extremities  Psychiatric:        Mood and Affect: Mood normal.        Behavior: Behavior normal.        Thought Content: Thought content normal.     Comments: Well groomed, good eye contact, normal speech and thoughts      Results for orders placed or performed in visit on 05/14/21  PSA  Result Value Ref Range   PSA 0.59 < OR = 4.00 ng/mL  Hemoglobin A1c  Result Value Ref Range   Hgb A1c MFr Bld 5.2 <5.7 % of total Hgb   Mean Plasma Glucose 103 mg/dL   eAG (mmol/L) 5.7 mmol/L  CBC with Differential/Platelet  Result Value Ref Range   WBC 5.1 3.8 - 10.8 Thousand/uL   RBC 5.26 4.20 - 5.80 Million/uL   Hemoglobin 16.0 13.2 - 17.1 g/dL   HCT 46.5 38.5 - 50.0 %   MCV 88.4 80.0 -  100.0 fL   MCH 30.4 27.0 - 33.0 pg   MCHC 34.4 32.0 - 36.0 g/dL   RDW 12.8 11.0 - 15.0 %   Platelets 214 140 - 400 Thousand/uL   MPV 10.6 7.5 - 12.5 fL   Neutro Abs 3,361 1,500 - 7,800 cells/uL   Lymphs Abs 959 850 - 3,900 cells/uL   Absolute Monocytes 617 200 - 950 cells/uL   Eosinophils Absolute 133 15 - 500 cells/uL   Basophils Absolute 31 0 - 200 cells/uL   Neutrophils Relative % 65.9 %   Total Lymphocyte 18.8 %   Monocytes Relative 12.1 %   Eosinophils Relative 2.6 %   Basophils Relative 0.6 %  Lipid panel  Result Value Ref Range   Cholesterol 218 (H) <200 mg/dL   HDL 67 > OR = 40 mg/dL   Triglycerides 75 <150 mg/dL   LDL Cholesterol (Calc) 134 (H) mg/dL (calc)   Total CHOL/HDL Ratio 3.3 <5.0 (calc)   Non-HDL Cholesterol (Calc) 151 (H) <130 mg/dL (calc)  COMPLETE METABOLIC PANEL WITH GFR  Result Value Ref Range   Glucose, Bld 143 (H) 65 - 99 mg/dL   BUN 15 7 - 25 mg/dL   Creat 1.26 0.70 - 1.30 mg/dL   eGFR 67 > OR = 60 mL/min/1.38m   BUN/Creatinine Ratio NOT APPLICABLE 6 - 22 (calc)   Sodium 140 135 - 146 mmol/L   Potassium 4.7 3.5 - 5.3 mmol/L   Chloride 102 98 - 110 mmol/L   CO2 30 20 - 32 mmol/L   Calcium 10.1 8.6 - 10.3 mg/dL   Total Protein 6.8 6.1 - 8.1 g/dL   Albumin 4.4  3.6 - 5.1 g/dL   Globulin 2.4 1.9 - 3.7 g/dL (calc)   AG Ratio 1.8 1.0 - 2.5 (calc)   Total Bilirubin 0.9 0.2 - 1.2 mg/dL   Alkaline phosphatase (APISO) 43 35 - 144 U/L   AST 14 10 - 35 U/L   ALT 11 9 - 46 U/L      Assessment & Plan:   Problem List Items Addressed This Visit     Alcohol dependence, uncomplicated ([place])    Still drinking alcohol, reduced amount Not in remission Discussed likely contributing factor for weight loss Encourage nutritional approach w/ ensure, folic acid, vitamin B1, improve nutrition overall      Benign hypertension with CKD (chronic kidney disease), stage II    Well-controlled HTN Home BP readings Complication with CKDII   Plan:  1. Continue  current BP regimen Amlodipine 20m 2. Encourage improved lifestyle - low sodium diet, regular exercise 3. Continue monitor BP outside office, bring readings to next visit, if persistently >140/90 or new symptoms notify office sooner      Relevant Medications   amLODipine (NORVASC) 10 MG tablet   Other Relevant Orders   COMPLETE METABOLIC PANEL WITH GFR   CBC with Differential/Platelet   BPH (benign prostatic hyperplasia)    Controlled PSA normal Off medication still      Relevant Orders   PSA   CKD (chronic kidney disease), stage III ([place]    See A&P HTN CKD II Elevated Creatinine 1.3 to 1.5 baseline previously Likely due to poor hydration, BC powder BP is controlled Reconsider BPH if still has any possible BOO Future reconsider Nephrology if indicated  Return for labs next week      Relevant Orders   COMPLETE METABOLIC PANEL WITH GFR   CBC with Differential/Platelet   Hyperlipidemia    LDL >130 The 10-year ASCVD risk score (Arnett DK, et al., 2019) is: 7.1%  Plan: 1. Not indicated for statin therapy 2. Encourage improved lifestyle - low carb/cholesterol, reduce portion size, continue improving regular exercise  Check lipid yearly upcoming lab      Relevant Medications   amLODipine (NORVASC) 10 MG tablet   Other Relevant Orders   COMPLETE METABOLIC PANEL WITH GFR   Lipid panel   Primary insomnia    Controlled insomnia On Trazodone, Xanax  Plan: Refilled Xanax 180mat night QHS PRN #30 +2 refills Continue Trazodone refills      Relevant Medications   ALPRAZolam (XANAX) 1 MG tablet   traZODone (DESYREL) 100 MG tablet   Recurrent major depression in full remission ([place]   Remission Continue current therapy      Relevant Medications   ALPRAZolam (XANAX) 1 MG tablet   traZODone (DESYREL) 100 MG tablet   Other Relevant Orders   COMPLETE METABOLIC PANEL WITH GFR   Other Visit Diagnoses     Annual physical exam    -  Primary   Relevant Orders    COMPLETE METABOLIC PANEL WITH GFR   CBC with Differential/Platelet   Lipid panel   Hemoglobin A1c   PSA   TSH   Essential hypertension       Relevant Medications   amLODipine (NORVASC) 10 MG tablet   Needs flu shot       Relevant Orders   Flu Vaccine QUAD 49m59mo (Fluarix, Fluzone & Alfiuria Quad PF) (Completed)       Updated Health Maintenance information Reviewed recent lab results with patient Encouraged improvement to lifestyle with diet and exercise Goal  of weight loss  Flu Shot today.  I recommend Colonoscopy Let me know if you would like to schedule this I can refer Usually to [facility] or [facility] in [place] or [place]. Message me   Otherwise can repeat Cologuard if you prefer. But then next time would do Colonoscopy  Try to increase calorie intake and we can see if the weight goes up. Likely related to some alcohol try to limit further Recommend OTC Vitamin Thiamine B1 and Folic Acid   Meds ordered this encounter  Medications   ALPRAZolam (XANAX) 1 MG tablet    Sig: TAKE 1 TABLET BY MOUTH AT BEDTIME AS NEEDED FOR ANXIETY    Dispense:  30 tablet    Refill:  2   amLODipine (NORVASC) 10 MG tablet    Sig: Take 1 tablet (10 mg total) by mouth daily.    Dispense:  90 tablet    Refill:  3   traZODone (DESYREL) 100 MG tablet    Sig: Take 1 tablet (100 mg total) by mouth at bedtime.    Dispense:  90 tablet    Refill:  3      Follow up plan: Return in about 5 days (around 05/19/2022) for 11/22 AM fasting lab only.  Nobie Putnam, DO [facility] 05/14/2022, 10:59 AM

## 2022-05-14 NOTE — Assessment & Plan Note (Signed)
LDL >130 The 10-year ASCVD risk score (Arnett DK, et al., 2019) is: 7.1%  Plan: 1. Not indicated for statin therapy 2. Encourage improved lifestyle - low carb/cholesterol, reduce portion size, continue improving regular exercise  Check lipid yearly upcoming lab

## 2022-05-14 NOTE — Assessment & Plan Note (Signed)
See A&P HTN CKD II Elevated Creatinine 1.3 to 1.5 baseline previously Likely due to poor hydration, BC powder BP is controlled Reconsider BPH if still has any possible BOO Future reconsider Nephrology if indicated  Return for labs next week

## 2022-05-14 NOTE — Assessment & Plan Note (Signed)
Controlled PSA normal Off medication still

## 2022-05-14 NOTE — Assessment & Plan Note (Signed)
Remission Continue current therapy

## 2022-05-14 NOTE — Assessment & Plan Note (Signed)
Controlled insomnia On Trazodone, Xanax  Plan: Refilled Xanax 1mg  at night QHS PRN #30 +2 refills Continue Trazodone refills

## 2022-05-14 NOTE — Patient Instructions (Addendum)
Thank you for coming to the office today.  Flu Shot today.  I recommend Colonoscopy Let me know if you would like to schedule this I can refer Usually to [facility] or [facility] in [place] or [place]. Message me   Otherwise can repeat Cologuard if you prefer. But then next time would do Colonoscopy  Try to increase calorie intake and we can see if the weight goes up. Likely related to some alcohol try to limit further Recommend OTC Vitamin Thiamine B1 and Folic Acid   DUE for FASTING BLOOD WORK (no food or drink after midnight before the lab appointment, only water or coffee without cream/sugar on the morning of)  SCHEDULE "Lab Only" visit in the morning at the clinic for lab draw in 05/19/22 - Make sure Lab Only appointment is at about 1 week before your next appointment, so that results will be available  For Lab Results, once available within 2-3 days of blood draw, you can can log in to MyChart online to view your results and a brief explanation. Also, we can discuss results at next follow-up visit.    Please schedule a Follow-up Appointment to: Return in about 5 days (around 05/19/2022) for 11/22 AM fasting lab only.  If you have any other questions or concerns, please feel free to call the office or send a message through MyChart. You may also schedule an earlier appointment if necessary.  Additionally, you may be receiving a survey about your experience at our office within a few days to 1 week by e-mail or mail. We value your feedback.  Saralyn Pilar, DO [facility], [place]

## 2022-05-14 NOTE — Assessment & Plan Note (Signed)
Well-controlled HTN Home BP readings Complication with CKDII   Plan:  1. Continue current BP regimen Amlodipine 10mg  2. Encourage improved lifestyle - low sodium diet, regular exercise 3. Continue monitor BP outside office, bring readings to next visit, if persistently >140/90 or new symptoms notify office sooner

## 2022-05-14 NOTE — Assessment & Plan Note (Signed)
Still drinking alcohol, reduced amount Not in remission Discussed likely contributing factor for weight loss Encourage nutritional approach w/ ensure, folic acid, vitamin B1, improve nutrition overall

## 2022-05-18 ENCOUNTER — Other Ambulatory Visit: Payer: Self-pay

## 2022-05-18 DIAGNOSIS — F3342 Major depressive disorder, recurrent, in full remission: Secondary | ICD-10-CM

## 2022-05-18 DIAGNOSIS — I129 Hypertensive chronic kidney disease with stage 1 through stage 4 chronic kidney disease, or unspecified chronic kidney disease: Secondary | ICD-10-CM

## 2022-05-18 DIAGNOSIS — N400 Enlarged prostate without lower urinary tract symptoms: Secondary | ICD-10-CM

## 2022-05-18 DIAGNOSIS — Z0000 Encounter for general adult medical examination without abnormal findings: Secondary | ICD-10-CM

## 2022-05-18 DIAGNOSIS — E782 Mixed hyperlipidemia: Secondary | ICD-10-CM

## 2022-05-18 DIAGNOSIS — N1831 Chronic kidney disease, stage 3a: Secondary | ICD-10-CM

## 2022-05-19 ENCOUNTER — Other Ambulatory Visit: Payer: BC Managed Care – PPO

## 2022-05-19 DIAGNOSIS — I129 Hypertensive chronic kidney disease with stage 1 through stage 4 chronic kidney disease, or unspecified chronic kidney disease: Secondary | ICD-10-CM | POA: Diagnosis not present

## 2022-05-19 DIAGNOSIS — E782 Mixed hyperlipidemia: Secondary | ICD-10-CM | POA: Diagnosis not present

## 2022-05-19 DIAGNOSIS — N400 Enlarged prostate without lower urinary tract symptoms: Secondary | ICD-10-CM | POA: Diagnosis not present

## 2022-05-19 DIAGNOSIS — N182 Chronic kidney disease, stage 2 (mild): Secondary | ICD-10-CM | POA: Diagnosis not present

## 2022-05-20 LAB — CBC WITH DIFFERENTIAL/PLATELET
Absolute Monocytes: 522 cells/uL (ref 200–950)
Basophils Absolute: 42 cells/uL (ref 0–200)
Basophils Relative: 0.9 %
Eosinophils Absolute: 113 cells/uL (ref 15–500)
Eosinophils Relative: 2.4 %
HCT: 43.6 % (ref 38.5–50.0)
Hemoglobin: 15.2 g/dL (ref 13.2–17.1)
Lymphs Abs: 813 cells/uL — ABNORMAL LOW (ref 850–3900)
MCH: 31.3 pg (ref 27.0–33.0)
MCHC: 34.9 g/dL (ref 32.0–36.0)
MCV: 89.7 fL (ref 80.0–100.0)
MPV: 10.5 fL (ref 7.5–12.5)
Monocytes Relative: 11.1 %
Neutro Abs: 3210 cells/uL (ref 1500–7800)
Neutrophils Relative %: 68.3 %
Platelets: 232 10*3/uL (ref 140–400)
RBC: 4.86 10*6/uL (ref 4.20–5.80)
RDW: 12.4 % (ref 11.0–15.0)
Total Lymphocyte: 17.3 %
WBC: 4.7 10*3/uL (ref 3.8–10.8)

## 2022-05-20 LAB — HEMOGLOBIN A1C
Hgb A1c MFr Bld: 5.4 % of total Hgb (ref <5.7)
Mean Plasma Glucose: 108 mg/dL
eAG (mmol/L): 6 mmol/L

## 2022-05-20 LAB — COMPLETE METABOLIC PANEL WITH GFR
AG Ratio: 2.2 (calc) (ref 1.0–2.5)
ALT: 12 U/L (ref 9–46)
AST: 16 U/L (ref 10–35)
Albumin: 4.6 g/dL (ref 3.6–5.1)
Alkaline phosphatase (APISO): 42 U/L (ref 35–144)
BUN: 13 mg/dL (ref 7–25)
CO2: 29 mmol/L (ref 20–32)
Calcium: 9.8 mg/dL (ref 8.6–10.3)
Chloride: 102 mmol/L (ref 98–110)
Creat: 1.16 mg/dL (ref 0.70–1.30)
Globulin: 2.1 g/dL (calc) (ref 1.9–3.7)
Glucose, Bld: 107 mg/dL — ABNORMAL HIGH (ref 65–99)
Potassium: 4 mmol/L (ref 3.5–5.3)
Sodium: 141 mmol/L (ref 135–146)
Total Bilirubin: 1.1 mg/dL (ref 0.2–1.2)
Total Protein: 6.7 g/dL (ref 6.1–8.1)
eGFR: 73 mL/min/{1.73_m2} (ref >=60)

## 2022-05-20 LAB — LIPID PANEL
Cholesterol: 213 mg/dL — ABNORMAL HIGH (ref <200)
HDL: 82 mg/dL (ref >=40)
LDL Cholesterol (Calc): 114 mg/dL (calc) — ABNORMAL HIGH
Non-HDL Cholesterol (Calc): 131 mg/dL (calc) — ABNORMAL HIGH (ref <130)
Total CHOL/HDL Ratio: 2.6 (calc) (ref <5.0)
Triglycerides: 73 mg/dL (ref <150)

## 2022-05-20 LAB — TSH: TSH: 0.79 mIU/L (ref 0.40–4.50)

## 2022-05-20 LAB — PSA: PSA: 0.53 ng/mL (ref <=4.00)

## 2022-08-11 ENCOUNTER — Ambulatory Visit: Payer: Self-pay

## 2022-08-11 NOTE — Telephone Encounter (Signed)
  Chief Complaint: pink eye  Symptoms: L eye redness, burning, drainage, blurry vision at times Frequency: started today but pt has had head congestion x 10 days  Pertinent Negatives: NA Disposition: [] ED /[] Urgent Care (no appt availability in office) / [x] Appointment(In office/virtual)/ []  [facility]/ [] Home Care/ [] Refused Recommended Disposition /[] [facility]/ []  Follow-up with PCP Additional Notes: pt has been treating cold sx OTC and now L eye started this morning, pt has OV scheduled tomorrow at 0820 with Dr. Raliegh Ip. Care advice given and pt verbalized understanding.   Summary: Pink eye, swelling   Pt has swollen eye shut, possible pink eye from daughter         Reason for Disposition  [1] Eye with yellow or green discharge, or eyelashes stick together AND [2] NO PCP standing order to call in antibiotic eye drops   (Exception: [place]; continue triage.)  Answer Assessment - Initial Assessment Questions 1. EYE DISCHARGE: "Is the discharge in one or both eyes?" "What color is it?" "How much is there?" "When did the discharge start?"      L eye  2. REDNESS OF SCLERA: "Is the redness in one or both eyes?" "When did the redness start?"      redness 3. EYELIDS: "Are the eyelids red or swollen?" If Yes, ask: "How much?"      Yes some  4. VISION: "Is there any difficulty seeing clearly?"      Blurry at times  5. PAIN: "Is there any pain? If Yes, ask: "How bad is it?" (Scale 1-10; or mild, moderate, severe)    - MILD (1-3): doesn't interfere with normal activities     - MODERATE (4-7): interferes with normal activities or awakens from sleep    - SEVERE (8-10): excruciating pain, unable to do any normal activities       Moderate  7. OTHER SYMPTOMS: "Do you have any other symptoms?" (e.g., fever, runny nose, cough)     Head congestion 10 days  Protocols used: Eye - Pus or Discharge-A-AH

## 2022-08-12 ENCOUNTER — Ambulatory Visit (INDEPENDENT_AMBULATORY_CARE_PROVIDER_SITE_OTHER): Payer: BC Managed Care – PPO | Admitting: Family Medicine

## 2022-08-12 ENCOUNTER — Encounter: Payer: Self-pay | Admitting: Family Medicine

## 2022-08-12 VITALS — BP 110/80 | HR 99 | Ht 69.0 in | Wt 152.4 lb

## 2022-08-12 DIAGNOSIS — R051 Acute cough: Secondary | ICD-10-CM

## 2022-08-12 DIAGNOSIS — J0110 Acute frontal sinusitis, unspecified: Secondary | ICD-10-CM | POA: Diagnosis not present

## 2022-08-12 DIAGNOSIS — R0981 Nasal congestion: Secondary | ICD-10-CM

## 2022-08-12 LAB — POC COVID19 BINAXNOW: SARS Coronavirus 2 Ag: NEGATIVE

## 2022-08-12 LAB — POC INFLUENZA A&B (BINAX/QUICKVUE)
Influenza A, POC: NEGATIVE
Influenza B, POC: NEGATIVE

## 2022-08-12 MED ORDER — PREDNISONE 10 MG PO TABS: ORAL_TABLET | ORAL | 0 refills | Status: DC

## 2022-08-12 MED ORDER — AMOXICILLIN-POT CLAVULANATE 875-125 MG PO TABS: 1.0000 | ORAL_TABLET | Freq: Two times a day (BID) | ORAL | 0 refills | Status: DC

## 2022-08-12 NOTE — Patient Instructions (Addendum)
Thank you for coming to the office today.   1. It sounds like you have a Sinusitis (Bacterial Infection) - this most likely started as an Upper Respiratory Virus that has settled into an infection. Allergies can also cause this. - Start Augmentin 1 pill twice daily (breakfast and dinner, with food and plenty of water) for 10 days, complete entire course, do not stop early even if feeling better - Use OTC cough congestion meds - Mucinex - Congestion draining down throat can cause irritation. May try warm herbal tea with honey, cough drops - Can take Tylenol or Ibuprofen as needed for fevers - May continue over the counter cold medicine as you are, I would not use any decongestant or mucinex longer than 7 days.  If you develop persistent fever >101F for at least 3 consecutive days, headaches with sinus pain or pressure or persistent earache, please schedule a follow-up evaluation within next few days to week.  Steroid prednisone only if shortness of breath worsening cough  Please schedule a Follow-up Appointment to: Return if symptoms worsen or fail to improve.  If you have any other questions or concerns, please feel free to call the office or send a message through [facility]. You may also schedule an earlier appointment if necessary.  Additionally, you may be receiving a survey about your experience at our office within a few days to 1 week by e-mail or mail. We value your feedback.  Nobie Putnam, DO [place]

## 2022-08-12 NOTE — Progress Notes (Signed)
Subjective:    Patient ID: Brett Huber, male    DOB: 02/20/1966, 57 y.o.   MRN: [id number]  Brett Huber is a 57 y.o. male presenting on 08/12/2022 for Conjunctivitis, Headache, Nasal Congestion, and Cough   HPI  Sinusitis 8-10 day head cold Sinus congestion, drainage Sick contact Admits some dyspnea and cough Denies any fever aches chills  Conjunctivitis symptoms L eye only drainage red irritation L eye pink eye, has eye drops already for pink eye      05/14/2022   10:52 AM 05/18/2021    9:19 AM 07/11/2020    9:04 AM  Depression screen PHQ 2/9  Decreased Interest 0 0 0  Down, Depressed, Hopeless 0 0 0  PHQ - 2 Score 0 0 0  Altered sleeping 0 1 0  Tired, decreased energy 1 1 1  $ Change in appetite 1 0 0  Feeling bad or failure about yourself  0 0 0  Trouble concentrating 0 0 0  Moving slowly or fidgety/restless 0 0 0  Suicidal thoughts 0 0 0  PHQ-9 Score 2 2 1  $ Difficult doing work/chores Not difficult at all Not difficult at all Not difficult at all    Social History   Tobacco Use   Smoking status: Never   Smokeless tobacco: Current    Types: Chew  Substance Use Topics   Alcohol use: Yes    Comment: occ   Drug use: No    Review of Systems Per HPI unless specifically indicated above     Objective:    BP 110/80   Pulse 99   Ht 5' 9"$  (1.753 m)   Wt 152 lb 6.4 oz (69.1 kg)   SpO2 99%   BMI 22.51 kg/m   Wt Readings from Last 3 Encounters:  08/12/22 152 lb 6.4 oz (69.1 kg)  05/14/22 149 lb (67.6 kg)  05/18/21 162 lb 12.8 oz (73.8 kg)    Physical Exam Vitals and nursing note reviewed.  Constitutional:      General: He is not in acute distress.    Appearance: Normal appearance. He is well-developed. He is not diaphoretic.     Comments: Well-appearing, comfortable, cooperative  HENT:     Head: Normocephalic and atraumatic.  Eyes:     General:        Right eye: No discharge.        Left eye: No discharge.     Extraocular  Movements: Extraocular movements intact.     Conjunctiva/sclera: Conjunctivae normal.     Comments: L eye conjunctival injection redness, some residual drainage  Neck:     Thyroid: No thyromegaly.  Cardiovascular:     Rate and Rhythm: Normal rate and regular rhythm.     Pulses: Normal pulses.     Heart sounds: Normal heart sounds. No murmur heard. Pulmonary:     Effort: Pulmonary effort is normal. No respiratory distress.     Breath sounds: Normal breath sounds. No wheezing or rales.  Musculoskeletal:        General: Normal range of motion.     Cervical back: Normal range of motion and neck supple.  Lymphadenopathy:     Cervical: No cervical adenopathy.  Skin:    General: Skin is warm and dry.     Findings: No erythema or rash.  Neurological:     Mental Status: He is alert and oriented to person, place, and time. Mental status is at baseline.  Psychiatric:  Mood and Affect: Mood normal.        Behavior: Behavior normal.        Thought Content: Thought content normal.     Comments: Well groomed, good eye contact, normal speech and thoughts    Results for orders placed or performed in visit on 05/18/22  TSH  Result Value Ref Range   TSH 0.79 0.40 - 4.50 mIU/L  PSA  Result Value Ref Range   PSA 0.53 < OR = 4.00 ng/mL  Hemoglobin A1c  Result Value Ref Range   Hgb A1c MFr Bld 5.4 <5.7 % of total Hgb   Mean Plasma Glucose 108 mg/dL   eAG (mmol/L) 6.0 mmol/L  Lipid panel  Result Value Ref Range   Cholesterol 213 (H) <200 mg/dL   HDL 82 > OR = 40 mg/dL   Triglycerides 73 <150 mg/dL   LDL Cholesterol (Calc) 114 (H) mg/dL (calc)   Total CHOL/HDL Ratio 2.6 <5.0 (calc)   Non-HDL Cholesterol (Calc) 131 (H) <130 mg/dL (calc)  CBC with Differential/Platelet  Result Value Ref Range   WBC 4.7 3.8 - 10.8 Thousand/uL   RBC 4.86 4.20 - 5.80 Million/uL   Hemoglobin 15.2 13.2 - 17.1 g/dL   HCT 43.6 38.5 - 50.0 %   MCV 89.7 80.0 - 100.0 fL   MCH 31.3 27.0 - 33.0 pg   MCHC 34.9  32.0 - 36.0 g/dL   RDW 12.4 11.0 - 15.0 %   Platelets 232 140 - 400 Thousand/uL   MPV 10.5 7.5 - 12.5 fL   Neutro Abs 3,210 1,500 - 7,800 cells/uL   Lymphs Abs 813 (L) 850 - 3,900 cells/uL   Absolute Monocytes 522 200 - 950 cells/uL   Eosinophils Absolute 113 15 - 500 cells/uL   Basophils Absolute 42 0 - 200 cells/uL   Neutrophils Relative % 68.3 %   Total Lymphocyte 17.3 %   Monocytes Relative 11.1 %   Eosinophils Relative 2.4 %   Basophils Relative 0.9 %  COMPLETE METABOLIC PANEL WITH GFR  Result Value Ref Range   Glucose, Bld 107 (H) 65 - 99 mg/dL   BUN 13 7 - 25 mg/dL   Creat 1.16 0.70 - 1.30 mg/dL   eGFR 73 > OR = 60 mL/min/1.36m   BUN/Creatinine Ratio SEE NOTE: 6 - 22 (calc)   Sodium 141 135 - 146 mmol/L   Potassium 4.0 3.5 - 5.3 mmol/L   Chloride 102 98 - 110 mmol/L   CO2 29 20 - 32 mmol/L   Calcium 9.8 8.6 - 10.3 mg/dL   Total Protein 6.7 6.1 - 8.1 g/dL   Albumin 4.6 3.6 - 5.1 g/dL   Globulin 2.1 1.9 - 3.7 g/dL (calc)   AG Ratio 2.2 1.0 - 2.5 (calc)   Total Bilirubin 1.1 0.2 - 1.2 mg/dL   Alkaline phosphatase (APISO) 42 35 - 144 U/L   AST 16 10 - 35 U/L   ALT 12 9 - 46 U/L      Assessment & Plan:   Problem List Items Addressed This Visit   None Visit Diagnoses     Acute non-recurrent frontal sinusitis    -  Primary   Relevant Medications   amoxicillin-clavulanate (AUGMENTIN) 875-125 MG tablet   predniSONE (DELTASONE) 10 MG tablet   Acute cough       Relevant Medications   predniSONE (DELTASONE) 10 MG tablet   Other Relevant Orders   POC COVID-19   POC Influenza A&B (Binax test)   Nasal congestion  Relevant Orders   POC COVID-19   POC Influenza A&B (Binax test)       Sinusitis (Bacterial Infection) - this most likely started as an Upper Respiratory Virus that has settled into an infection. Allergies can also cause this. - Start Augmentin 1 pill twice daily (breakfast and dinner, with food and plenty of water) for 10 days, complete entire  course, do not stop early even if feeling better - Use OTC cough congestion meds - Mucinex - Congestion draining down throat can cause irritation. May try warm herbal tea with honey, cough drops - Can take Tylenol or Ibuprofen as needed for fevers - May continue over the counter cold medicine as you are, I would not use any decongestant or mucinex longer than 7 days.   Meds ordered this encounter  Medications   amoxicillin-clavulanate (AUGMENTIN) 875-125 MG tablet    Sig: Take 1 tablet by mouth 2 (two) times daily.    Dispense:  20 tablet    Refill:  0   predniSONE (DELTASONE) 10 MG tablet    Sig: Take 6 tabs with breakfast Day 1, 5 tabs Day 2, 4 tabs Day 3, 3 tabs Day 4, 2 tabs Day 5, 1 tab Day 6.    Dispense:  21 tablet    Refill:  0      Follow up plan: Return if symptoms worsen or fail to improve.   Nobie Putnam, DO [facility] 08/12/2022, 8:50 AM

## 2022-09-13 ENCOUNTER — Other Ambulatory Visit: Payer: Self-pay | Admitting: Family Medicine

## 2022-09-13 DIAGNOSIS — F5101 Primary insomnia: Secondary | ICD-10-CM

## 2022-09-14 NOTE — Telephone Encounter (Signed)
Requested medication (s) are due for refill today: yes  Requested medication (s) are on the active medication list: yes  Last refill:  05/14/22 #30 2 refills  Future visit scheduled: no  Notes to clinic:  not delegated per protocol. Do you want to refill Rx?     Requested Prescriptions  Pending Prescriptions Disp Refills   ALPRAZolam (XANAX) 1 MG tablet [Pharmacy Med Name: ALPRAZolam 1 MG Oral Tablet] 30 tablet 0    Sig: TAKE 1 TABLET BY MOUTH AT BEDTIME AS NEEDED FOR ANXIETY     Not Delegated - Psychiatry: Anxiolytics/Hypnotics 2 Failed - 09/13/2022  7:43 PM      Failed - This refill cannot be delegated      Failed - Urine Drug Screen completed in last 360 days      Passed - Patient is not pregnant      Passed - Valid encounter within last 6 months    Recent Outpatient Visits           1 month ago Acute non-recurrent frontal sinusitis   [facility] [place], Devonne Doughty, DO   4 months ago Annual physical exam   [facility] Hauser, DO   1 year ago Benign hypertension with CKD (chronic kidney disease), stage II   [place], DO   2 years ago Benign hypertension with CKD (chronic kidney disease), stage II   [place], DO   2 years ago Annual physical exam   [place], Devonne Doughty, [place]

## 2022-12-14 ENCOUNTER — Other Ambulatory Visit: Payer: Self-pay | Admitting: Family Medicine

## 2022-12-14 DIAGNOSIS — F5101 Primary insomnia: Secondary | ICD-10-CM

## 2022-12-14 NOTE — Telephone Encounter (Signed)
Requested medication (s) are due for refill today: Due 12/15/22  Requested medication (s) are on the active medication list: yes    Last refill: 09/14/22   #30  2 refills  Future visit scheduled no  Notes to clinic:Not delegated, please review. Thank you.  Requested Prescriptions  Pending Prescriptions Disp Refills   ALPRAZolam (XANAX) 1 MG tablet [Pharmacy Med Name: ALPRAZolam 1 MG Oral Tablet] 30 tablet 0    Sig: TAKE 1 TABLET BY MOUTH AT BEDTIME AS NEEDED FOR ANXIETY     Not Delegated - Psychiatry: Anxiolytics/Hypnotics 2 Failed - 12/14/2022  4:28 PM      Failed - This refill cannot be delegated      Failed - Urine Drug Screen completed in last 360 days      Passed - Patient is not pregnant      Passed - Valid encounter within last 6 months    Recent Outpatient Visits           4 months ago Acute non-recurrent frontal sinusitis   [place] [facility] [place], Netta Neat, DO   7 months ago Annual physical exam   [place] [facility] Smitty Cords, DO   1 year ago Benign hypertension with CKD (chronic kidney disease), stage II   [place] [facility] [place], Netta Neat, DO   2 years ago Benign hypertension with CKD (chronic kidney disease), stage II   [place] [facility] [place], Netta Neat, DO   2 years ago Annual physical exam   [place] [facility] [place], Netta Neat, [place]

## 2023-02-24 ENCOUNTER — Other Ambulatory Visit: Payer: Self-pay | Admitting: Family Medicine

## 2023-02-24 DIAGNOSIS — F5101 Primary insomnia: Secondary | ICD-10-CM

## 2023-02-25 NOTE — Telephone Encounter (Signed)
Requested Prescriptions  Refused Prescriptions Disp Refills   traZODone (DESYREL) 100 MG tablet [Pharmacy Med Name: traZODone HCl 100 MG Oral Tablet] 90 tablet 0    Sig: TAKE 1 TABLET BY MOUTH AT BEDTIME     Psychiatry: Antidepressants - Serotonin Modulator Failed - 02/24/2023  6:43 AM      Failed - Valid encounter within last 6 months    Recent Outpatient Visits           6 months ago Acute non-recurrent frontal sinusitis   [place] Smitty Cords, DO   9 months ago Annual physical exam   [place] [facility] Smit[facility] Cords, DO   1 year ago Benign hypertension with CKD (chronic kidney disease), stage II   [place] [facility] [place], Netta Neat, DO   2 years ago Benign hypertension with CKD (chronic kidney disease), stage II   [place] [facility] [place], Netta Neat, DO   2 years ago Annual physical exam   [place] [facility] [place], Netta Neat, DO              Passed - Completed PHQ-2 or PHQ-9 in the last 360 days

## 2023-03-15 ENCOUNTER — Other Ambulatory Visit: Payer: Self-pay | Admitting: Family Medicine

## 2023-03-15 DIAGNOSIS — F5101 Primary insomnia: Secondary | ICD-10-CM

## 2023-03-17 NOTE — Telephone Encounter (Signed)
Requested medication (s) are due for refill today -yes  Requested medication (s) are on the active medication list -yes  Future visit scheduled -yes  Last refill: 12/15/22 #30 2RF  Notes to clinic: non delegated Rx  Requested Prescriptions  Pending Prescriptions Disp Refills   ALPRAZolam (XANAX) 1 MG tablet [Pharmacy Med Name: ALPRAZolam 1 MG Oral Tablet] 30 tablet 0    Sig: TAKE 1 TABLET BY MOUTH AT BEDTIME AS NEEDED FOR ANXIETY     Not Delegated - Psychiatry: Anxiolytics/Hypnotics 2 Failed - 03/15/2023  7:41 PM      Failed - This refill cannot be delegated      Failed - Urine Drug Screen completed in last 360 days      Failed - Valid encounter within last 6 months    Recent Outpatient Visits           7 months ago Acute non-recurrent frontal sinusitis   [place] [facility] Smitty Cords, DO   10 months ago Annual physical exam   [place] [facility] Smit[facility] C[facility], DO   1 year ago Benign hypertension with CKD (chronic kidney disease), stage II   [place] [facility] [place], Netta Neat, DO   2 years ago Benign hypertension with CKD (chronic kidney disease), stage II   [place] [facility] [place], Netta Neat, DO   2 years ago Annual physical exam   [place] [facility] Althea Charon, Netta Neat, DO       Future Appointments             In 3 months Althea Charon, Netta Neat, DO [place] [facility], [facility]            Passed - Patient is not pregnant         Requested Prescriptions  Pending Prescriptions Disp Refills   ALPRAZolam (XANAX) 1 MG tablet [Pharmacy Med Name: ALPRAZolam 1 MG Oral Tablet] 30 tablet 0    Sig: TAKE 1 TABLET BY MOUTH AT BEDTIME AS NEEDED FOR ANXIETY     Not Delegated - Psychiatry: Anxiolytics/Hypnotics 2 Failed - 03/15/2023  7:41 PM      Failed - This refill cannot be delegated      Failed - Urine  Drug Screen completed in last 360 days      Failed - Valid encounter within last 6 months    Recent Outpatient Visits           7 months ago Acute non-recurrent frontal sinusitis   [place] [facility] Smitty Cords, DO   10 months ago Annual physical exam   [place] [facility] Smitty Cords, DO   1 year ago Benign hypertension with CKD (chronic kidney disease), stage II   [place] [facility] [place], Netta Neat, DO   2 years ago Benign hypertension with CKD (chronic kidney disease), stage II   [place] [facility] [place], Netta Neat, DO   2 years ago Annual physical exam   [place] [facility] Smitty Cords, DO       Future Appointments             In 3 months Althea Charon, Netta Neat, DO [place] [facility], [facility]            Passed - Patient is not pregnant

## 2023-03-21 ENCOUNTER — Ambulatory Visit: Payer: Self-pay

## 2023-03-21 NOTE — Telephone Encounter (Signed)
Chief Complaint: Rash Symptoms: red small bumps covering bilateral legs, back and stomach, itchy Frequency: constant onset yesterday Pertinent Negatives: Patient denies poison ivy exposure Disposition: [] ED /[x] Urgent Care (no appt availability in office) / [] Appointment(In office/virtual)/ []  [place] [facility]/ [] Home Care/ [] Refused Recommended Disposition /[] [facility]/ []  Follow-up with PCP Additional Notes: Patient reports a rash of red small bumps covering bilateral legs, the lower back and most of the abdominal area. Onset yesterday and getting worse. Patient does not know what is causing the rash. Patient stated his wife told him it looks like chicken pox but he had chicken pox as a child. Patient reports the rash is itchy 7/10. Patient has not tried over the counter antihistamine or anti itch cream yet. Care advice given and patient stated he will try Benadryl and to see if it improves his symptoms, if not he will go to urgent care as a walk-in. No appointments available in office.  Reason for Disposition  Mild widespread rash  (Exception: Heat rash lasting 3 days or less.)  Answer Assessment - Initial Assessment Questions 1. APPEARANCE of RASH: "Describe the rash." (e.g., spots, blisters, raised areas, skin peeling, scaly)     Red small bumps all over 2. SIZE: "How big are the spots?" (e.g., tip of pen, eraser, coin; inches, centimeters)     Tip of pen 3. LOCATION: "Where is the rash located?"     Bilateral legs, up my back, stomach 4. COLOR: "What color is the rash?" (Note: It is difficult to assess rash color in people with darker-colored skin. When this situation occurs, simply ask the caller to describe what they see.)     Red  5. ONSET: "When did the rash begin?"     Yesterday 6. FEVER: "Do you have a fever?" If Yes, ask: "What is your temperature, how was it measured, and when did it start?"     No 7. ITCHING: "Does the rash itch?" If Yes, ask: "How bad is the  itch?" (Scale 1-10; or mild, moderate, severe)     Itching, 7/10 8. CAUSE: "What do you think is causing the rash?"     I don't know 9. MEDICINE FACTORS: "Have you started any new medicines within the last 2 weeks?" (e.g., antibiotics)      No 10. OTHER SYMPTOMS: "Do you have any other symptoms?" (e.g., dizziness, headache, sore throat, joint pain)       No  Protocols used: Rash or Redness - [facility]

## 2023-04-22 ENCOUNTER — Other Ambulatory Visit: Payer: Self-pay | Admitting: Internal Medicine

## 2023-04-22 DIAGNOSIS — F5101 Primary insomnia: Secondary | ICD-10-CM

## 2023-04-25 NOTE — Telephone Encounter (Signed)
Requested medication (s) are due for refill today: yes  Requested medication (s) are on the active medication list: yes  Last refill:  03/17/23 #30  Future visit scheduled: yes  Notes to clinic:  med not delegated to NT to RF   Requested Prescriptions  Pending Prescriptions Disp Refills   ALPRAZolam (XANAX) 1 MG tablet [Pharmacy Med Name: ALPRAZolam 1 MG Oral Tablet] 30 tablet 0    Sig: TAKE 1 TABLET BY MOUTH AT BEDTIME AS NEEDED FOR ANXIETY     Not Delegated - Psychiatry: Anxiolytics/Hypnotics 2 Failed - 04/22/2023 10:32 AM      Failed - This refill cannot be delegated      Failed - Urine Drug Screen completed in last 360 days      Failed - Valid encounter within last 6 months    Recent Outpatient Visits           8 months ago Acute non-recurrent frontal sinusitis   [place]mit[facility], DO   11 months ago Annual physical exam   [place] [facility] Smit[facility], DO   1 year ago Benign hypertension with CKD (chronic kidney disease), stage II   [place] [facility] [place], Netta Neat, DO   2 years ago Benign hypertension with CKD (chronic kidney disease), stage II   [place] [facility] [place], Netta Neat, DO   3 years ago Annual physical exam   [place] [facility] Smitty Cords, DO       Future Appointments             In 1 month Althea Charon, Netta Neat, DO  [facility], [facility]            Passed - Patient is not pregnant

## 2023-05-27 ENCOUNTER — Other Ambulatory Visit: Payer: Self-pay | Admitting: Family Medicine

## 2023-05-27 DIAGNOSIS — I10 Essential (primary) hypertension: Secondary | ICD-10-CM

## 2023-05-31 NOTE — Telephone Encounter (Signed)
Courtesy refill. Patient must keep upcoming office visit for further refills. Requested Prescriptions  Pending Prescriptions Disp Refills   amLODipine (NORVASC) 10 MG tablet [Pharmacy Med Name: amLODIPine Besylate 10 MG Oral Tablet] 30 tablet 0    Sig: Take 1 tablet by mouth once daily     Cardiovascular: Calcium Channel Blockers 2 Failed - 05/27/2023  6:49 AM      Failed - Valid encounter within last 6 months    Recent Outpatient Visits           9 months ago Acute non-recurrent frontal sinusitis   [place] [facility] Char[facility], Netta Neat, DO   1 year ago Annual physical exam   [place] [facility] Smit[facility] C[facility], DO   2 years ago Benign hypertension with CKD (chronic kidney disease), stage II   [place] [facility] [place], Netta Neat, DO   2 years ago Benign hypertension with CKD (chronic kidney disease), stage II   [place] [facility] [place], Netta Neat, DO   3 years ago Annual physical exam   [place] [facility] Smitty Cords, DO       Future Appointments             In 2 weeks Althea Charon, Netta Neat, DO  [facility]            Passed - Last BP in normal range    BP Readings from Last 1 Encounters:  08/12/22 110/80         Passed - Last Heart Rate in normal range    Pulse Readings from Last 1 Encounters:  08/12/22 99

## 2023-06-15 ENCOUNTER — Other Ambulatory Visit: Payer: Self-pay | Admitting: Family Medicine

## 2023-06-15 DIAGNOSIS — F5101 Primary insomnia: Secondary | ICD-10-CM

## 2023-06-15 NOTE — Telephone Encounter (Signed)
Upcoming appointment 06/28/23-Courtesy 30 day Rx given Requested Prescriptions  Pending Prescriptions Disp Refills   traZODone (DESYREL) 100 MG tablet [Pharmacy Med Name: traZODone HCl 100 MG Oral Tablet] 90 tablet 0    Sig: TAKE 1 TABLET BY MOUTH AT BEDTIME     Psychiatry: Antidepressants - Serotonin Modulator Failed - 06/15/2023  2:20 PM      Failed - Completed PHQ-2 or PHQ-9 in the last 360 days      Failed - Valid encounter within last 6 months    Recent Outpatient Visits           10 months ago Acute non-recurrent frontal sinusitis   [place] [facility] Char[facility], Netta Neat, DO   1 year ago Annual physical exam   [place] [facility] Smitty Cords, DO   2 years ago Benign hypertension with CKD (chronic kidney disease), stage II   [place] [facility] [place], Netta Neat, DO   2 years ago Benign hypertension with CKD (chronic kidney disease), stage II   [place] [facility] [place], Netta Neat, DO   3 years ago Annual physical exam    [facility] Smitty Cords, DO       Future Appointments             In 1 week Althea Charon, Netta Neat, DO  [facility], [facility]

## 2023-06-17 ENCOUNTER — Encounter: Payer: BC Managed Care – PPO | Admitting: Family Medicine

## 2023-06-28 ENCOUNTER — Encounter: Payer: Self-pay | Admitting: Family Medicine

## 2023-06-28 ENCOUNTER — Ambulatory Visit (INDEPENDENT_AMBULATORY_CARE_PROVIDER_SITE_OTHER): Payer: BC Managed Care – PPO | Admitting: Family Medicine

## 2023-06-28 VITALS — BP 132/74 | HR 78 | Ht 69.0 in | Wt 159.0 lb

## 2023-06-28 DIAGNOSIS — Z0000 Encounter for general adult medical examination without abnormal findings: Secondary | ICD-10-CM

## 2023-06-28 DIAGNOSIS — I10 Essential (primary) hypertension: Secondary | ICD-10-CM

## 2023-06-28 DIAGNOSIS — Z1211 Encounter for screening for malignant neoplasm of colon: Secondary | ICD-10-CM

## 2023-06-28 DIAGNOSIS — Z23 Encounter for immunization: Secondary | ICD-10-CM

## 2023-06-28 DIAGNOSIS — Z125 Encounter for screening for malignant neoplasm of prostate: Secondary | ICD-10-CM | POA: Diagnosis not present

## 2023-06-28 DIAGNOSIS — E782 Mixed hyperlipidemia: Secondary | ICD-10-CM

## 2023-06-28 DIAGNOSIS — F3342 Major depressive disorder, recurrent, in full remission: Secondary | ICD-10-CM

## 2023-06-28 DIAGNOSIS — I129 Hypertensive chronic kidney disease with stage 1 through stage 4 chronic kidney disease, or unspecified chronic kidney disease: Secondary | ICD-10-CM

## 2023-06-28 DIAGNOSIS — F5101 Primary insomnia: Secondary | ICD-10-CM

## 2023-06-28 DIAGNOSIS — N182 Chronic kidney disease, stage 2 (mild): Secondary | ICD-10-CM

## 2023-06-28 DIAGNOSIS — N400 Enlarged prostate without lower urinary tract symptoms: Secondary | ICD-10-CM | POA: Diagnosis not present

## 2023-06-28 MED ORDER — TRAZODONE HCL 100 MG PO TABS: 100.0000 mg | ORAL_TABLET | Freq: Every day | ORAL | 3 refills | Status: DC

## 2023-06-28 MED ORDER — AMLODIPINE BESYLATE 10 MG PO TABS: 10.0000 mg | ORAL_TABLET | Freq: Every day | ORAL | 3 refills | Status: DC

## 2023-06-28 NOTE — Patient Instructions (Addendum)
 Thank you for coming to the office today.  Labs ordered today  ----------------  Ordered the Cologuard (home kit) test for colon cancer screening. Stay tuned for further updates.  It will be shipped to you directly. If not received in 2-4 weeks, call us  or the company.   If you send it back and no results are received in 2-4 weeks, call us  or the company as well!   Colon Cancer Screening: - For all adults age 59+ routine colon cancer screening is highly recommended.     - Recent guidelines from American Cancer Society recommend starting age of 25 - Early detection of colon cancer is important, because often there are no warning signs or symptoms, also if found early usually it can be cured. Late stage is hard to treat.   - If Cologuard is NEGATIVE, then it is good for 3 years before next due - If Cologuard is POSITIVE, then it is strongly advised to get a Colonoscopy, which allows the GI doctor to locate the source of the cancer or polyp (even very early stage) and treat it by removing it. ------------------------- Follow instructions to collect sample, you may call the company for any help or questions, 24/7 telephone support at [phone].   You have been referred for a Coronary Calcium Score Cardiac CT Scan. This is a screening test for patients aged 56-50+ with cardiovascular risk factors or who are healthy but would be interested in Cardiovascular Screening for heart disease. Even if there is a family history of heart disease, this imaging can be useful. Typically it can be done every 5+ years or at a different timeline we agree on  The scan will look at the chest and mainly focus on the heart and identify early signs of calcium build up or blockages within the heart arteries. It is not 100% accurate for identifying blockages or heart disease, but it is useful to help us  predict who may have some early changes or be at risk in the future for a heart attack or cardiovascular  problem.  The results are reviewed by a Cardiologist and they will document the results. It should become available on MyChart. Typically the results are divided into percentiles based on other patients of the same demographic and age. So it will compare your risk to others similar to you. If you have a higher score >99 or higher percentile >75%tile, it is recommended to consider Statin cholesterol therapy and or referral to Cardiologist. I will try to help explain your results and if we have questions we can contact the Cardiologist.  You will be contacted for scheduling. Usually it is done at any imaging facility through [facility], [facility] or [facility].  The cost is $99 flat fee total and it does not go through insurance, so no authorization is required.  --------------------  DUE for FASTING BLOOD WORK (no food or drink after midnight before the lab appointment, only water or coffee without cream/sugar on the morning of)  SCHEDULE Lab Only visit in the morning at the clinic for lab draw in 10 Month  - Make sure Lab Only appointment is at about 1 week before your next appointment, so that results will be available  For Lab Results, once available within 2-3 days of blood draw, you can can log in to MyChart online to view your results and a brief explanation. Also, we can discuss results at next follow-up visit.   Please schedule a Follow-up Appointment to:  Return in about 10 months (around 04/27/2024) for 10 month fasting lab then 1 week later Annual Physical.  If you have any other questions or concerns, please feel free to call the office or send a message through MyChart. You may also schedule an earlier appointment if necessary.  Additionally, you may be receiving a survey about your experience at our office within a few days to 1 week by e-mail or mail. We value your feedback.  Marsa Officer, DO [facility], [place]

## 2023-06-28 NOTE — Progress Notes (Signed)
 Subjective:    Patient ID: Brett Huber, male    DOB: October 06, 1965, 57 y.o.   MRN: [id number]  Brett Huber is a 57 y.o. male presenting on 06/28/2023 for Hypertension and Annual Exam   HPI  Discussed the use of AI scribe software for clinical note transcription with the patient, who gave verbal consent to proceed.  History of Present Illness         Here for Annual Physical and future labs.   Improved weight gain, he attempted to gain some weight and was successful up 10 lbs and feels better, at 159 lbs. Healthy BMI   CHRONIC HTN CKD II Improving hydration and lifestyle - Today patient reports doing well - Home BP improved Current Meds - Amlodipine  10mg  daily Reports good compliance, took meds today. Tolerating well, w/o complaints. Denies CP, dyspnea, HA, edema, dizziness / lightheadedness   INSOMNIA, Chronic Anxiety Major Depression recurrent in remission See prior notes, has been stable on current med management now. He had been doing very well. Denies depression. Anxiety has been controlled. Sleep has improved on medicines - taking Trazodone  100mg  nightly, needs refill - Taking Xanax  1mg  = one whole 1mg  pill 4-5 days a week before bed for insomnia on work days, and occasionally takes a half pill for a few nights before bed if weekend The patient is currently managing the pain with ibuprofen and is exploring non-pharmacological interventions, such as a specialized pillow to alleviate the discomfort. Usually awaken due to pain with shoulders / neuropathy   HYPERLIPIDEMIA: - Reports no concerns. Last lipid panel 2023, controlled except mild elevated LDL 114 Not on statin therapy    Alcohol Dependence uncomplicated Resumed drinking, small amount 2-3 drinks 1-2 times a week now, not every day     Health Maintenance:   Cologuard, last done 05/22/19, negative, repeat due now 3+ years. Ordered today.  PSA 0.59 negative.   Future Shingrix at pharmacy    Flu Shot today     06/28/2023    9:27 AM 05/14/2022   10:52 AM 05/18/2021    9:19 AM  Depression screen PHQ 2/9  Decreased Interest 0 0 0  Down, Depressed, Hopeless 0 0 0  PHQ - 2 Score 0 0 0  Altered sleeping 1 0 1  Tired, decreased energy 1 1 1   Change in appetite 0 1 0  Feeling bad or failure about yourself  0 0 0  Trouble concentrating 0 0 0  Moving slowly or fidgety/restless 0 0 0  Suicidal thoughts 0 0 0  PHQ-9 Score 2 2 2   Difficult doing work/chores  Not difficult at all Not difficult at all       06/28/2023    9:27 AM 05/14/2022   10:52 AM 05/18/2021    9:19 AM 07/11/2020    9:05 AM  GAD 7 : Generalized Anxiety Score  Nervous, Anxious, on Edge 0 1 0 0  Control/stop worrying 0 0 0 0  Worry too much - different things 0 0 0 1  Trouble relaxing 0 1 0 0  Restless 0 0 0 0  Easily annoyed or irritable 0 1 0 0  Afraid - awful might happen 0 0 0 1  Total GAD 7 Score 0 3 0 2  Anxiety Difficulty  Not difficult at all Not difficult at all Not difficult at all     Past Medical History:  Diagnosis Date   Anxiety    Chronic pain    DDD (degenerative  disc disease)    Hyperlipidemia    Hypertension    Insomnia    Past Surgical History:  Procedure Laterality Date   CERVICAL FUSION  2004   CHOLECYSTECTOMY  2007   neck and disc replacement     SHOULDER ARTHROSCOPY WITH ROTATOR CUFF REPAIR AND SUBACROMIAL DECOMPRESSION Left 01/29/2013   Procedure: LEFT SHOULDER ARTHROSCOPY WITH ROTATOR CUFF REPAIR AND SUBACROMIAL DECOMPRESSION DISTAL CLAVICLE RESECTION;  Surgeon: Eva Elsie Herring, MD;  Location: [facility];  Service: Orthopedics;  Laterality: Left;   Social History   Socioeconomic History   Marital status: Married    Spouse name: Not on file   Number of children: Not on file   Years of education: Not on file   Highest education level: Not on file  Occupational History   Not on file  Tobacco Use   Smoking status: Never   Smokeless  tobacco: Current    Types: Chew  Substance and Sexual Activity   Alcohol use: Yes    Comment: occ   Drug use: No   Sexual activity: Not on file  Other Topics Concern   Not on file  Social History Narrative   Not on file   Social Drivers of Health   Financial Resource Strain: Not on file  Food Insecurity: Not on file  Transportation Needs: Not on file  Physical Activity: Not on file  Stress: Not on file  Social Connections: Not on file  Intimate Partner Violence: Not on file   Family History  Problem Relation Age of Onset   Diabetes Mother    Stroke Father    Prostate cancer Neg Hx    Kidney cancer Neg Hx    Bladder Cancer Neg Hx    Current Outpatient Medications on File Prior to Visit  Medication Sig   acetaminophen  (TYLENOL ) 325 MG tablet Take 2 tablets (650 mg total) by mouth every 6 (six) hours as needed for mild pain, moderate pain or headache.   ALPRAZolam  (XANAX ) 1 MG tablet TAKE 1 TABLET BY MOUTH AT BEDTIME AS NEEDED FOR ANXIETY   Multiple Vitamin (MULTIVITAMIN WITH MINERALS) TABS tablet Take 1 tablet by mouth daily.   triamcinolone  ointment (KENALOG ) 0.5 % Apply 1 application topically 2 (two) times daily. 1-2 weeks, for hands. (Patient not taking: Reported on 06/28/2023)   No current facility-administered medications on file prior to visit.    Review of Systems  Constitutional:  Negative for activity change, appetite change, chills, diaphoresis, fatigue and fever.  HENT:  Negative for congestion and hearing loss.   Eyes:  Negative for visual disturbance.  Respiratory:  Negative for cough, chest tightness, shortness of breath and wheezing.   Cardiovascular:  Negative for chest pain, palpitations and leg swelling.  Gastrointestinal:  Negative for abdominal pain, constipation, diarrhea, nausea and vomiting.  Genitourinary:  Negative for dysuria, frequency and hematuria.  Musculoskeletal:  Negative for arthralgias and neck pain.  Skin:  Negative for rash.   Neurological:  Negative for dizziness, weakness, light-headedness, numbness and headaches.  Hematological:  Negative for adenopathy.  Psychiatric/Behavioral:  Negative for behavioral problems, dysphoric mood and sleep disturbance.    Per HPI unless specifically indicated above     Objective:    BP 132/74 (BP Location: Left Arm, Cuff Size: Normal)   Pulse 78   Ht 5' 9 (1.753 m)   Wt 159 lb (72.1 kg)   SpO2 92%   BMI 23.48 kg/m   Wt Readings from Last 3 Encounters:  06/28/23 159  lb (72.1 kg)  08/12/22 152 lb 6.4 oz (69.1 kg)  05/14/22 149 lb (67.6 kg)    Physical Exam Vitals and nursing note reviewed.  Constitutional:      General: He is not in acute distress.    Appearance: He is well-developed. He is not diaphoretic.     Comments: Well-appearing, comfortable, cooperative  HENT:     Head: Normocephalic and atraumatic.  Eyes:     General:        Right eye: No discharge.        Left eye: No discharge.     Conjunctiva/sclera: Conjunctivae normal.     Pupils: Pupils are equal, round, and reactive to light.  Neck:     Thyroid: No thyromegaly.     Vascular: No carotid bruit.  Cardiovascular:     Rate and Rhythm: Normal rate and regular rhythm.     Pulses: Normal pulses.     Heart sounds: Normal heart sounds. No murmur heard. Pulmonary:     Effort: Pulmonary effort is normal. No respiratory distress.     Breath sounds: Normal breath sounds. No wheezing or rales.  Abdominal:     General: Bowel sounds are normal. There is no distension.     Palpations: Abdomen is soft. There is no mass.     Tenderness: There is no abdominal tenderness.  Musculoskeletal:        General: No tenderness. Normal range of motion.     Cervical back: Normal range of motion and neck supple.     Right lower leg: No edema.     Left lower leg: No edema.     Comments: Upper / Lower Extremities: - Normal muscle tone, strength bilateral upper extremities 5/5, lower extremities 5/5  Lymphadenopathy:      Cervical: No cervical adenopathy.  Skin:    General: Skin is warm and dry.     Findings: No erythema or rash.  Neurological:     Mental Status: He is alert and oriented to person, place, and time.     Comments: Distal sensation intact to light touch all extremities  Psychiatric:        Mood and Affect: Mood normal.        Behavior: Behavior normal.        Thought Content: Thought content normal.     Comments: Well groomed, good eye contact, normal speech and thoughts         Assessment & Plan:   Problem List Items Addressed This Visit     Benign hypertension with CKD (chronic kidney disease), stage II   Relevant Medications   amLODipine  (NORVASC ) 10 MG tablet   Other Relevant Orders   CBC with Differential/Platelet   COMPLETE METABOLIC PANEL WITH GFR   CT CARDIAC SCORING (SELF PAY ONLY)   BPH (benign prostatic hyperplasia)   Relevant Orders   PSA   Hyperlipidemia   Relevant Medications   amLODipine  (NORVASC ) 10 MG tablet   Other Relevant Orders   Lipid panel   COMPLETE METABOLIC PANEL WITH GFR   CT CARDIAC SCORING (SELF PAY ONLY)   Primary insomnia   Relevant Medications   traZODone  (DESYREL ) 100 MG tablet   Recurrent major depression in full remission (HCC)   Relevant Medications   traZODone  (DESYREL ) 100 MG tablet   Other Visit Diagnoses       Annual physical exam    -  Primary   Relevant Orders   Lipid panel   Hemoglobin A1c  CBC with Differential/Platelet   COMPLETE METABOLIC PANEL WITH GFR   PSA   TSH     Flu vaccine need       Relevant Orders   Flu vaccine trivalent PF, 6mos and older(Flulaval,Afluria,Fluarix,Fluzone) (Completed)     Screening for colon cancer       Relevant Orders   Cologuard     Prostate cancer screening       Relevant Orders   PSA     Essential hypertension       Relevant Medications   amLODipine  (NORVASC ) 10 MG tablet        Updated Health Maintenance information Fasting lab only today, ordered. Encouraged  improvement to lifestyle with diet and exercise Goal of weight loss   Vaccination Status Flu shot administered today -Recommend patient to check cost coverage for shingles vaccine and consider receiving it at a pharmacy.  Colon Cancer Screening Previous Cologuard test in 2020. Discussed the need for a repeat test. -Order Cologuard test.  Cardiovascular Health Discussed the potential for a CT scan of the heart to assess for coronary artery disease. Patient expressed interest in the scan. -Order CT scan of the heart.  Neuropathy Patient reports neuropathy causing sleep disturbances. Discussed potential interventions, including different nerve pills and use of a specialized pillow. Patient expressed interest in trying the pillow. -Advise patient to continue current management and consider other interventions if symptoms persist.  Follow-up Discussed scheduling the next annual physical in October. -Schedule next annual physical in October 2025.         Orders Placed This Encounter  Procedures   CT CARDIAC SCORING (SELF PAY ONLY)    Standing Status:   Future    Expiration Date:   06/27/2024    Preferred imaging location?:   [facility]   Flu vaccine trivalent PF, 6mos and older(Flulaval,Afluria,Fluarix,Fluzone)   Cologuard   Lipid panel    Has the patient fasted?:   Yes   Hemoglobin A1c   CBC with Differential/Platelet   COMPLETE METABOLIC PANEL WITH GFR   PSA   TSH    Meds ordered this encounter  Medications   amLODipine  (NORVASC ) 10 MG tablet    Sig: Take 1 tablet (10 mg total) by mouth daily.    Dispense:  90 tablet    Refill:  3   traZODone  (DESYREL ) 100 MG tablet    Sig: Take 1 tablet (100 mg total) by mouth at bedtime.    Dispense:  90 tablet    Refill:  3     Follow up plan: Return in about 10 months (around 04/27/2024) for 10 month fasting lab then 1 week later Annual Physical.  He will message in advance for [facility] orders  Marsa Officer, DO [facility] 06/28/2023, 9:47 AM

## 2023-06-29 LAB — LIPID PANEL
Cholesterol: 272 mg/dL — ABNORMAL HIGH (ref <200)
HDL: 92 mg/dL (ref >=40)
LDL Cholesterol (Calc): 161 mg/dL — ABNORMAL HIGH
Non-HDL Cholesterol (Calc): 180 mg/dL — ABNORMAL HIGH (ref <130)
Total CHOL/HDL Ratio: 3 (calc) (ref <5.0)
Triglycerides: 84 mg/dL (ref <150)

## 2023-06-29 LAB — COMPLETE METABOLIC PANEL WITH GFR
AG Ratio: 1.9 (calc) (ref 1.0–2.5)
ALT: 15 U/L (ref 9–46)
AST: 17 U/L (ref 10–35)
Albumin: 4.9 g/dL (ref 3.6–5.1)
Alkaline phosphatase (APISO): 53 U/L (ref 35–144)
BUN: 21 mg/dL (ref 7–25)
CO2: 29 mmol/L (ref 20–32)
Calcium: 10.1 mg/dL (ref 8.6–10.3)
Chloride: 102 mmol/L (ref 98–110)
Creat: 1.13 mg/dL (ref 0.70–1.30)
Globulin: 2.6 g/dL (ref 1.9–3.7)
Glucose, Bld: 112 mg/dL — ABNORMAL HIGH (ref 65–99)
Potassium: 4.3 mmol/L (ref 3.5–5.3)
Sodium: 143 mmol/L (ref 135–146)
Total Bilirubin: 1 mg/dL (ref 0.2–1.2)
Total Protein: 7.5 g/dL (ref 6.1–8.1)
eGFR: 75 mL/min/{1.73_m2} (ref >=60)

## 2023-06-29 LAB — HEMOGLOBIN A1C
Hgb A1c MFr Bld: 5.4 %{Hb}
Mean Plasma Glucose: 108 mg/dL
eAG (mmol/L): 6 mmol/L

## 2023-06-29 LAB — CBC WITH DIFFERENTIAL/PLATELET
Absolute Lymphocytes: 917 {cells}/uL (ref 850–3900)
Absolute Monocytes: 722 {cells}/uL (ref 200–950)
Basophils Absolute: 52 {cells}/uL (ref 0–200)
Basophils Relative: 0.8 %
Eosinophils Absolute: 124 {cells}/uL (ref 15–500)
Eosinophils Relative: 1.9 %
HCT: 45.4 % (ref 38.5–50.0)
Hemoglobin: 15.6 g/dL (ref 13.2–17.1)
MCH: 30.8 pg (ref 27.0–33.0)
MCHC: 34.4 g/dL (ref 32.0–36.0)
MCV: 89.5 fL (ref 80.0–100.0)
MPV: 10.6 fL (ref 7.5–12.5)
Monocytes Relative: 11.1 %
Neutro Abs: 4687 {cells}/uL (ref 1500–7800)
Neutrophils Relative %: 72.1 %
Platelets: 244 Thousand/uL (ref 140–400)
RBC: 5.07 Million/uL (ref 4.20–5.80)
RDW: 12.8 % (ref 11.0–15.0)
Total Lymphocyte: 14.1 %
WBC: 6.5 Thousand/uL (ref 3.8–10.8)

## 2023-06-29 LAB — PSA: PSA: 0.56 ng/mL (ref <=4.00)

## 2023-06-29 LAB — TSH: TSH: 1.43 m[IU]/L (ref 0.40–4.50)

## 2023-07-08 ENCOUNTER — Ambulatory Visit
Admission: RE | Admit: 2023-07-08 | Discharge: 2023-07-08 | Disposition: A | Discharge status: Home / Self Care | Payer: No Typology Code available for payment source | Source: Ambulatory Visit | Attending: Family Medicine | Admitting: Family Medicine

## 2023-07-08 DIAGNOSIS — N182 Chronic kidney disease, stage 2 (mild): Secondary | ICD-10-CM | POA: Insufficient documentation

## 2023-07-08 DIAGNOSIS — E782 Mixed hyperlipidemia: Secondary | ICD-10-CM | POA: Insufficient documentation

## 2023-07-08 DIAGNOSIS — I129 Hypertensive chronic kidney disease with stage 1 through stage 4 chronic kidney disease, or unspecified chronic kidney disease: Secondary | ICD-10-CM | POA: Insufficient documentation

## 2023-07-19 ENCOUNTER — Encounter: Payer: Self-pay | Admitting: Family Medicine

## 2023-10-28 ENCOUNTER — Other Ambulatory Visit: Payer: Self-pay | Admitting: Family Medicine

## 2023-10-28 DIAGNOSIS — F5101 Primary insomnia: Secondary | ICD-10-CM

## 2023-10-31 NOTE — Telephone Encounter (Signed)
 Requested medication (s) are due for refill today: yes  Requested medication (s) are on the active medication list: yes  Last refill:  04/25/23  Future visit scheduled: yes  Notes to clinic:  Unable to refill per protocol, cannot delegate.      Requested Prescriptions  Pending Prescriptions Disp Refills   ALPRAZolam  (XANAX ) 1 MG tablet [Pharmacy Med Name: ALPRAZolam  1 MG Oral Tablet] 30 tablet 0    Sig: TAKE 1 TABLET BY MOUTH AT BEDTIME AS NEEDED FOR ANXIETY     Not Delegated - Psychiatry: Anxiolytics/Hypnotics 2 Failed - 10/31/2023  4:28 PM      Failed - This refill cannot be delegated      Failed - Urine Drug Screen completed in last 360 days      Failed - Valid encounter within last 6 months    Recent Outpatient Visits   None     Future Appointments             In 5 months Karamalegos, Kayleen Party, DO [facility], [facility]            Passed - Patient is not pregnant

## 2024-04-13 ENCOUNTER — Ambulatory Visit (INDEPENDENT_AMBULATORY_CARE_PROVIDER_SITE_OTHER): Admitting: Family Medicine

## 2024-04-13 ENCOUNTER — Encounter: Payer: Self-pay | Admitting: Family Medicine

## 2024-04-13 ENCOUNTER — Other Ambulatory Visit: Payer: Self-pay | Admitting: Family Medicine

## 2024-04-13 VITALS — BP 102/60 | HR 63 | Ht 69.0 in | Wt 159.4 lb

## 2024-04-13 DIAGNOSIS — Z0000 Encounter for general adult medical examination without abnormal findings: Secondary | ICD-10-CM

## 2024-04-13 DIAGNOSIS — Z23 Encounter for immunization: Secondary | ICD-10-CM | POA: Diagnosis not present

## 2024-04-13 DIAGNOSIS — N400 Enlarged prostate without lower urinary tract symptoms: Secondary | ICD-10-CM

## 2024-04-13 DIAGNOSIS — F5101 Primary insomnia: Secondary | ICD-10-CM

## 2024-04-13 DIAGNOSIS — I10 Essential (primary) hypertension: Secondary | ICD-10-CM

## 2024-04-13 DIAGNOSIS — E782 Mixed hyperlipidemia: Secondary | ICD-10-CM

## 2024-04-13 DIAGNOSIS — I129 Hypertensive chronic kidney disease with stage 1 through stage 4 chronic kidney disease, or unspecified chronic kidney disease: Secondary | ICD-10-CM

## 2024-04-13 DIAGNOSIS — R7309 Other abnormal glucose: Secondary | ICD-10-CM

## 2024-04-13 MED ORDER — ALPRAZOLAM 1 MG PO TABS: ORAL_TABLET | ORAL | 5 refills | Status: AC

## 2024-04-13 NOTE — Progress Notes (Unsigned)
 Subjective:    Patient ID: Brett Huber, male    DOB: [birth year], 58 y.o.   MRN: [id number]  Brett Huber is a 58 y.o. male presenting on 04/13/2024 for Medical Management of Chronic Issues   HPI  Improved weight gain, he attempted to gain some weight and was successful up 10 lbs and feels better, at 159 lbs. Healthy BMI   CHRONIC HTN CKD II Improving hydration and lifestyle - Today patient reports doing well - Home BP improved Current Meds - Amlodipine  10mg  daily Reports good compliance, took meds today. Tolerating well, w/o complaints. Denies CP, dyspnea, HA, edema, dizziness / lightheadedness   INSOMNIA, Chronic Anxiety Major Depression recurrent in remission See prior notes, has been stable on current med management now. He had been doing very well. Denies depression. Anxiety has been controlled. Sleep has improved on medicines - taking Trazodone  100mg  nightly, needs refill - Taking Xanax  1mg  = one whole 1mg  pill 4-5 days a week before bed for insomnia on work days, and occasionally takes a half pill for a few nights before bed if weekend The patient is currently managing the pain with ibuprofen and is exploring non-pharmacological interventions, such as a specialized pillow to alleviate the discomfort. Usually awaken due to pain with shoulders / neuropathy   HYPERLIPIDEMIA: - Reports no concerns. Last lipid panel 2023, controlled except mild elevated LDL 114 Not on statin therapy     Alcohol Dependence uncomplicated Resumed drinking, small amount 2-3 drinks 1-2 times a week now, not every day     Health Maintenance:   Cologuard, last done 05/22/19, negative, repeat due now 3+ years. Ordered today.   PSA 0.59 negative.   Future Shingrix at pharmacy   Flu Shot today    Health Maintenance: ***     04/13/2024   10:53 AM 06/28/2023    9:27 AM 05/14/2022   10:52 AM  Depression screen PHQ 2/9  Decreased Interest 0 0 0  Down, Depressed,  Hopeless 0 0 0  PHQ - 2 Score 0 0 0  Altered sleeping 1 1 0  Tired, decreased energy 1 1 1   Change in appetite 0 0 1  Feeling bad or failure about yourself  0 0 0  Trouble concentrating 0 0 0  Moving slowly or fidgety/restless 0 0 0  Suicidal thoughts 0 0 0  PHQ-9 Score 2 2 2   Difficult doing work/chores Not difficult at all  Not difficult at all       04/13/2024   10:53 AM 06/28/2023    9:27 AM 05/14/2022   10:52 AM 05/18/2021    9:19 AM  GAD 7 : Generalized Anxiety Score  Nervous, Anxious, on Edge 0 0 1 0  Control/stop worrying 0 0 0 0  Worry too much - different things 0 0 0 0  Trouble relaxing 0 0 1 0  Restless 0 0 0 0  Easily annoyed or irritable 0 0 1 0  Afraid - awful might happen 0 0 0 0  Total GAD 7 Score 0 0 3 0  Anxiety Difficulty   Not difficult at all Not difficult at all    Social History   Tobacco Use   Smoking status: Never   Smokeless tobacco: Current    Types: Chew  Substance Use Topics   Alcohol use: Yes    Comment: occ   Drug use: No    Review of Systems Per HPI unless specifically indicated above     Objective:  BP 102/60   Pulse 63   Ht 5' 9 (1.753 m)   Wt 159 lb 6.4 oz (72.3 kg)   SpO2 98%   BMI 23.54 kg/m   Wt Readings from Last 3 Encounters:  04/13/24 159 lb 6.4 oz (72.3 kg)  06/28/23 159 lb (72.1 kg)  08/12/22 152 lb 6.4 oz (69.1 kg)    Physical Exam  Results for orders placed or performed in visit on 06/28/23  Lipid panel   Collection Time: 06/28/23 10:20 AM  Result Value Ref Range   Cholesterol 272 (H) <200 mg/dL   HDL 92 > OR = 40 mg/dL   Triglycerides 84 <849 mg/dL   LDL Cholesterol (Calc) 161 (H) mg/dL (calc)   Total CHOL/HDL Ratio 3.0 <5.0 (calc)   Non-HDL Cholesterol (Calc) 180 (H) <130 mg/dL (calc)  Hemoglobin J8r   Collection Time: 06/28/23 10:20 AM  Result Value Ref Range   Hgb A1c MFr Bld 5.4 <5.7 % of total Hgb   Mean Plasma Glucose 108 mg/dL   eAG (mmol/L) 6.0 mmol/L  CBC with  Differential/Platelet   Collection Time: 06/28/23 10:20 AM  Result Value Ref Range   WBC 6.5 3.8 - 10.8 Thousand/uL   RBC 5.07 4.20 - 5.80 Million/uL   Hemoglobin 15.6 13.2 - 17.1 g/dL   HCT 54.5 61.4 - 49.9 %   MCV 89.5 80.0 - 100.0 fL   MCH 30.8 27.0 - 33.0 pg   MCHC 34.4 32.0 - 36.0 g/dL   RDW 87.1 88.9 - 84.9 %   Platelets 244 140 - 400 Thousand/uL   MPV 10.6 7.5 - 12.5 fL   Neutro Abs 4,687 1,500 - 7,800 cells/uL   Absolute Lymphocytes 917 850 - 3,900 cells/uL   Absolute Monocytes 722 200 - 950 cells/uL   Eosinophils Absolute 124 15 - 500 cells/uL   Basophils Absolute 52 0 - 200 cells/uL   Neutrophils Relative % 72.1 %   Total Lymphocyte 14.1 %   Monocytes Relative 11.1 %   Eosinophils Relative 1.9 %   Basophils Relative 0.8 %  COMPLETE METABOLIC PANEL WITH GFR   Collection Time: 06/28/23 10:20 AM  Result Value Ref Range   Glucose, Bld 112 (H) 65 - 99 mg/dL   BUN 21 7 - 25 mg/dL   Creat 8.86 9.29 - 8.69 mg/dL   eGFR 75 > OR = 60 fO/fpw/8.26f7   BUN/Creatinine Ratio SEE NOTE: 6 - 22 (calc)   Sodium 143 135 - 146 mmol/L   Potassium 4.3 3.5 - 5.3 mmol/L   Chloride 102 98 - 110 mmol/L   CO2 29 20 - 32 mmol/L   Calcium 10.1 8.6 - 10.3 mg/dL   Total Protein 7.5 6.1 - 8.1 g/dL   Albumin 4.9 3.6 - 5.1 g/dL   Globulin 2.6 1.9 - 3.7 g/dL (calc)   AG Ratio 1.9 1.0 - 2.5 (calc)   Total Bilirubin 1.0 0.2 - 1.2 mg/dL   Alkaline phosphatase (APISO) 53 35 - 144 U/L   AST 17 10 - 35 U/L   ALT 15 9 - 46 U/L  PSA   Collection Time: 06/28/23 10:20 AM  Result Value Ref Range   PSA 0.56 < OR = 4.00 ng/mL  TSH   Collection Time: 06/28/23 10:20 AM  Result Value Ref Range   TSH 1.43 0.40 - 4.50 mIU/L      Assessment & Plan:   Problem List Items Addressed This Visit   None    ***  No orders of the  defined types were placed in this encounter.   No orders of the defined types were placed in this encounter.   Follow up plan: No follow-ups on file.  ***Future labs  ordered for ***    Marsa Officer, DO [facility] 04/13/2024, 11:13 AM

## 2024-04-13 NOTE — Patient Instructions (Addendum)
 Thank you for coming to the office today.  REfilled Alprazolam  for up to 6 more months, fill date 05/02/24  Other meds call us  or message me before you run out if not going to make it  Flu Shot and Prevnar-20 today  [facility] orders  Colon Cancer Screening: Ordered the Cologuard (home kit) test for colon cancer screening. Stay tuned for further updates.  It will be shipped to you directly. If not received in 2-4 weeks, call us  or the company.   If you send it back and no results are received in 2-4 weeks, call us  or the company as well!   Colon Cancer Screening: - For all adults age 52+ routine colon cancer screening is highly recommended.     - Recent guidelines from American Cancer Society recommend starting age of 73 - Early detection of colon cancer is important, because often there are no warning signs or symptoms, also if found early usually it can be cured. Late stage is hard to treat.   - If Cologuard is NEGATIVE, then it is good for 3 years before next due - If Cologuard is POSITIVE, then it is strongly advised to get a Colonoscopy, which allows the GI doctor to locate the source of the cancer or polyp (even very early stage) and treat it by removing it. ------------------------- Follow instructions to collect sample, you may call the company for any help or questions, 24/7 telephone support at [phone].   Please schedule a Follow-up Appointment to: Return if symptoms worsen or fail to improve.  If you have any other questions or concerns, please feel free to call the office or send a message through MyChart. You may also schedule an earlier appointment if necessary.  Additionally, you may be receiving a survey about your experience at our office within a few days to 1 week by e-mail or mail. We value your feedback.  Marsa Officer, DO [facility], [place]

## 2024-04-14 DIAGNOSIS — Z23 Encounter for immunization: Secondary | ICD-10-CM | POA: Diagnosis not present

## 2024-04-14 NOTE — Progress Notes (Incomplete)
 Subjective:    Patient ID: Brett Huber, male    DOB: 1966/05/14, 58 y.o.   MRN: [id number]  Brett Huber is a 58 y.o. male presenting on 04/13/2024 for Medical Management of Chronic Issues   HPI  Discussed the use of AI scribe software for clinical note transcription with the patient, who gave verbal consent to proceed.  History of Present Illness   Brett Huber is a 58 year old male who presents for medication refills and vaccinations.  Medication management - Requires refills for current prescriptions, as supply will not last until scheduled physical in January 2026. - Currently taking alprazolam  1 mg nightly as needed for anxiety, which improves sleep quality. - Last alprazolam  pickup was on October 8, following a 28-day refill cycle. - Amlodipine  and trazodone  recently filled for a three-month supply.  Weight gain - Recent weight gain of ten pounds.  Preventive health maintenance - Last pneumococcal vaccination administered in 2018. - Scheduled for a physical examination on July 06, 2024. - Plans to complete comprehensive laboratory panel at [facility] one week prior to physical, including monitoring of kidney function.  Colorectal cancer screening - Cologuard kit ordered in December was lost during a move and has not been completed.       Improved weight gain, he attempted to gain some weight and was successful up 10 lbs and feels better, at 159 lbs. Healthy BMI   CHRONIC HTN CKD II Improving hydration and lifestyle - Today patient reports doing well - Home BP improved Current Meds - Amlodipine  10mg  daily Reports good compliance, took meds today. Tolerating well, w/o complaints. Denies CP, dyspnea, HA, edema, dizziness / lightheadedness   INSOMNIA, Chronic Anxiety Major Depression recurrent in remission See prior notes, has been stable on current med management now. He had been doing very well. Denies depression. Anxiety has been controlled.  Sleep has improved on medicines - taking Trazodone  100mg  nightly, needs refill - Taking Xanax  1mg  = one whole 1mg  pill 4-5 days a week before bed for insomnia on work days, and occasionally takes a half pill for a few nights before bed if weekend The patient is currently managing the pain with ibuprofen and is exploring non-pharmacological interventions, such as a specialized pillow to alleviate the discomfort. Usually awaken due to pain with shoulders / neuropathy   HYPERLIPIDEMIA: - Reports no concerns. Last lipid panel 2023, controlled except mild elevated LDL 114 Not on statin therapy     Alcohol Dependence uncomplicated Resumed drinking, small amount 2-3 drinks 1-2 times a week now, not every day     Health Maintenance:   Cologuard, last done 05/22/19, negative, repeat due now 3+ years. Ordered today.   PSA 0.59 negative.   Future Shingrix at pharmacy   Flu Shot today    Health Maintenance: ***     04/13/2024   10:53 AM 06/28/2023    9:27 AM 05/14/2022   10:52 AM  Depression screen PHQ 2/9  Decreased Interest 0 0 0  Down, Depressed, Hopeless 0 0 0  PHQ - 2 Score 0 0 0  Altered sleeping 1 1 0  Tired, decreased energy 1 1 1   Change in appetite 0 0 1  Feeling bad or failure about yourself  0 0 0  Trouble concentrating 0 0 0  Moving slowly or fidgety/restless 0 0 0  Suicidal thoughts 0 0 0  PHQ-9 Score 2 2 2   Difficult doing work/chores Not difficult at all  Not difficult at all  04/13/2024   10:53 AM 06/28/2023    9:27 AM 05/14/2022   10:52 AM 05/18/2021    9:19 AM  GAD 7 : Generalized Anxiety Score  Nervous, Anxious, on Edge 0 0 1 0  Control/stop worrying 0 0 0 0  Worry too much - different things 0 0 0 0  Trouble relaxing 0 0 1 0  Restless 0 0 0 0  Easily annoyed or irritable 0 0 1 0  Afraid - awful might happen 0 0 0 0  Total GAD 7 Score 0 0 3 0  Anxiety Difficulty   Not difficult at all Not difficult at all    Social History   Tobacco  Use  . Smoking status: Never  . Smokeless tobacco: Current    Types: Chew  Substance Use Topics  . Alcohol use: Yes    Comment: occ  . Drug use: No    Review of Systems Per HPI unless specifically indicated above     Objective:    BP 102/60   Pulse 63   Ht 5' 9 (1.753 m)   Wt 159 lb 6.4 oz (72.3 kg)   SpO2 98%   BMI 23.54 kg/m   Wt Readings from Last 3 Encounters:  04/13/24 159 lb 6.4 oz (72.3 kg)  06/28/23 159 lb (72.1 kg)  08/12/22 152 lb 6.4 oz (69.1 kg)    Physical Exam  Results for orders placed or performed in visit on 06/28/23  Lipid panel   Collection Time: 06/28/23 10:20 AM  Result Value Ref Range   Cholesterol 272 (H) <200 mg/dL   HDL 92 > OR = 40 mg/dL   Triglycerides 84 <849 mg/dL   LDL Cholesterol (Calc) 161 (H) mg/dL (calc)   Total CHOL/HDL Ratio 3.0 <5.0 (calc)   Non-HDL Cholesterol (Calc) 180 (H) <130 mg/dL (calc)  Hemoglobin J8r   Collection Time: 06/28/23 10:20 AM  Result Value Ref Range   Hgb A1c MFr Bld 5.4 <5.7 % of total Hgb   Mean Plasma Glucose 108 mg/dL   eAG (mmol/L) 6.0 mmol/L  CBC with Differential/Platelet   Collection Time: 06/28/23 10:20 AM  Result Value Ref Range   WBC 6.5 3.8 - 10.8 Thousand/uL   RBC 5.07 4.20 - 5.80 Million/uL   Hemoglobin 15.6 13.2 - 17.1 g/dL   HCT 54.5 61.4 - 49.9 %   MCV 89.5 80.0 - 100.0 fL   MCH 30.8 27.0 - 33.0 pg   MCHC 34.4 32.0 - 36.0 g/dL   RDW 87.1 88.9 - 84.9 %   Platelets 244 140 - 400 Thousand/uL   MPV 10.6 7.5 - 12.5 fL   Neutro Abs 4,687 1,500 - 7,800 cells/uL   Absolute Lymphocytes 917 850 - 3,900 cells/uL   Absolute Monocytes 722 200 - 950 cells/uL   Eosinophils Absolute 124 15 - 500 cells/uL   Basophils Absolute 52 0 - 200 cells/uL   Neutrophils Relative % 72.1 %   Total Lymphocyte 14.1 %   Monocytes Relative 11.1 %   Eosinophils Relative 1.9 %   Basophils Relative 0.8 %  COMPLETE METABOLIC PANEL WITH GFR   Collection Time: 06/28/23 10:20 AM  Result Value Ref Range    Glucose, Bld 112 (H) 65 - 99 mg/dL   BUN 21 7 - 25 mg/dL   Creat 8.86 9.29 - 8.69 mg/dL   eGFR 75 > OR = 60 fO/fpw/8.26f7   BUN/Creatinine Ratio SEE NOTE: 6 - 22 (calc)   Sodium 143 135 - 146 mmol/L  Potassium 4.3 3.5 - 5.3 mmol/L   Chloride 102 98 - 110 mmol/L   CO2 29 20 - 32 mmol/L   Calcium 10.1 8.6 - 10.3 mg/dL   Total Protein 7.5 6.1 - 8.1 g/dL   Albumin 4.9 3.6 - 5.1 g/dL   Globulin 2.6 1.9 - 3.7 g/dL (calc)   AG Ratio 1.9 1.0 - 2.5 (calc)   Total Bilirubin 1.0 0.2 - 1.2 mg/dL   Alkaline phosphatase (APISO) 53 35 - 144 U/L   AST 17 10 - 35 U/L   ALT 15 9 - 46 U/L  PSA   Collection Time: 06/28/23 10:20 AM  Result Value Ref Range   PSA 0.56 < OR = 4.00 ng/mL  TSH   Collection Time: 06/28/23 10:20 AM  Result Value Ref Range   TSH 1.43 0.40 - 4.50 mIU/L      Assessment & Plan:   Problem List Items Addressed This Visit     Primary insomnia - Primary   Relevant Medications   ALPRAZolam  (XANAX ) 1 MG tablet   Other Visit Diagnoses       Need for Streptococcus pneumoniae vaccination       Relevant Orders   Pneumococcal conjugate vaccine 20-valent     Needs flu shot         Essential hypertension            For next visit - Order comprehensive lab work at [facility], including chemistry, cholesterol, glucose, CBC, prostate, thyroid, kidney, and liver panels, to be done a week before the physical appointment. - Provide paper requisition form for lab work. - Advise to contact the company for a new Cologuard kit before December.  Hypertension Controlled on Amlodipine  10mg  - Monitor medication supply and contact office for a courtesy fill if needed before the next refill is due.  Anxiety Anxiety managed with alprazolam  1 mg nightly as needed. Reports good sleep with current regimen. - Refill alprazolam  1 mg with next pickup on May 02, 2024, at [facility]. - Advise to contact the pharmacy before running out of medication.  General Health  Maintenance Discussed vaccinations including flu and pneumonia shots. Last pneumonia shot in 2018, due for update. - Administer flu shot. - Administer pneumonia shot.        Orders Placed This Encounter  Procedures  . Pneumococcal conjugate vaccine 20-valent    Meds ordered this encounter  Medications  . ALPRAZolam  (XANAX ) 1 MG tablet    Sig: TAKE 1 TABLET BY MOUTH AT BEDTIME AS NEEDED FOR ANXIETY    Dispense:  30 tablet    Refill:  5    First fill 05/02/24    Follow up plan: Return if symptoms worsen or fail to improve.  [facility] orders given for January 2026  Marsa Officer, DO [facility] 04/13/2024, 11:13 AM

## 2024-04-14 NOTE — Addendum Note (Signed)
 Addended by: EDMAN MARSA PARAS on: 04/14/2024 12:48 AM   Modules accepted: Level of Service

## 2024-04-20 ENCOUNTER — Other Ambulatory Visit: Payer: BC Managed Care – PPO

## 2024-04-27 ENCOUNTER — Ambulatory Visit: Payer: Self-pay | Admitting: Family Medicine

## 2024-06-16 ENCOUNTER — Other Ambulatory Visit: Payer: Self-pay | Admitting: Family Medicine

## 2024-06-16 DIAGNOSIS — I10 Essential (primary) hypertension: Secondary | ICD-10-CM

## 2024-06-20 NOTE — Telephone Encounter (Signed)
 Requested Prescriptions  Pending Prescriptions Disp Refills   amLODipine  (NORVASC ) 10 MG tablet [Pharmacy Med Name: amLODIPine  Besylate 10 MG Oral Tablet] 90 tablet 0    Sig: Take 1 tablet by mouth once daily     Cardiovascular: Calcium Channel Blockers 2 Passed - 06/20/2024  8:36 AM      Passed - Last BP in normal range    BP Readings from Last 1 Encounters:  04/13/24 102/60         Passed - Last Heart Rate in normal range    Pulse Readings from Last 1 Encounters:  04/13/24 63         Passed - Valid encounter within last 6 months    Recent Outpatient Visits           2 months ago Primary insomnia    [facility] [place], [place]a PARAS, [place]

## 2024-06-29 ENCOUNTER — Other Ambulatory Visit: Payer: Self-pay | Admitting: Family Medicine

## 2024-06-29 DIAGNOSIS — F5101 Primary insomnia: Secondary | ICD-10-CM

## 2024-06-29 NOTE — Telephone Encounter (Signed)
 Requested Prescriptions  Pending Prescriptions Disp Refills   traZODone  (DESYREL ) 100 MG tablet [Pharmacy Med Name: traZODone  HCl 100 MG Oral Tablet] 90 tablet 0    Sig: TAKE 1 TABLET BY MOUTH AT BEDTIME     Psychiatry: Antidepressants - Serotonin Modulator Passed - 06/29/2024  2:37 PM      Passed - Completed PHQ-2 or PHQ-9 in the last 360 days      Passed - Valid encounter within last 6 months    Recent Outpatient Visits           2 months ago Primary insomnia   [facility] [place], [place] PARAS, [place]

## 2024-07-04 LAB — CBC WITH DIFFERENTIAL/PLATELET
Basophils Absolute: 0 x10E3/uL (ref 0.0–0.2)
Basos: 1 %
EOS (ABSOLUTE): 0.1 x10E3/uL (ref 0.0–0.4)
Eos: 1 %
Hematocrit: 45.4 % (ref 37.5–51.0)
Hemoglobin: 15.6 g/dL (ref 13.0–17.7)
Immature Grans (Abs): 0 x10E3/uL (ref 0.0–0.1)
Immature Granulocytes: 0 %
Lymphocytes Absolute: 1.2 x10E3/uL (ref 0.7–3.1)
Lymphs: 17 %
MCH: 31.2 pg (ref 26.6–33.0)
MCHC: 34.4 g/dL (ref 31.5–35.7)
MCV: 91 fL (ref 79–97)
Monocytes Absolute: 0.7 x10E3/uL (ref 0.1–0.9)
Monocytes: 11 %
Neutrophils Absolute: 4.7 x10E3/uL (ref 1.4–7.0)
Neutrophils: 70 %
Platelets: 252 x10E3/uL (ref 150–450)
RBC: 5 x10E6/uL (ref 4.14–5.80)
RDW: 12.8 % (ref 11.6–15.4)
WBC: 6.8 x10E3/uL (ref 3.4–10.8)

## 2024-07-04 LAB — HEMOGLOBIN A1C
Est. average glucose Bld gHb Est-mCnc: 103 mg/dL
Hgb A1c MFr Bld: 5.2 % (ref 4.8–5.6)

## 2024-07-04 LAB — COMPREHENSIVE METABOLIC PANEL WITH GFR
ALT: 15 IU/L (ref 0–44)
AST: 17 IU/L (ref 0–40)
Albumin: 4.9 g/dL (ref 3.8–4.9)
Alkaline Phosphatase: 55 IU/L (ref 47–123)
BUN/Creatinine Ratio: 13 (ref 10–24)
BUN: 16 mg/dL (ref 8–27)
Bilirubin Total: 1.2 mg/dL (ref 0.0–1.2)
CO2: 26 mmol/L (ref 20–29)
Calcium: 9.9 mg/dL (ref 8.6–10.2)
Chloride: 98 mmol/L (ref 96–106)
Creatinine, Ser: 1.2 mg/dL (ref 0.76–1.27)
Globulin, Total: 2.2 g/dL (ref 1.5–4.5)
Glucose: 100 mg/dL — ABNORMAL HIGH (ref 70–99)
Potassium: 4.1 mmol/L (ref 3.5–5.2)
Sodium: 139 mmol/L (ref 134–144)
Total Protein: 7.1 g/dL (ref 6.0–8.5)
eGFR: 69 mL/min/1.73

## 2024-07-04 LAB — LIPID PANEL
Chol/HDL Ratio: 3 ratio (ref 0.0–5.0)
Cholesterol, Total: 234 mg/dL — ABNORMAL HIGH (ref 100–199)
HDL: 78 mg/dL
LDL Chol Calc (NIH): 129 mg/dL — ABNORMAL HIGH (ref 0–99)
Triglycerides: 153 mg/dL — ABNORMAL HIGH (ref 0–149)
VLDL Cholesterol Cal: 27 mg/dL (ref 5–40)

## 2024-07-04 LAB — PSA: Prostate Specific Ag, Serum: 0.5 ng/mL (ref 0.0–4.0)

## 2024-07-04 LAB — TSH: TSH: 1.53 u[IU]/mL (ref 0.450–4.500)

## 2024-07-06 ENCOUNTER — Ambulatory Visit (INDEPENDENT_AMBULATORY_CARE_PROVIDER_SITE_OTHER): Payer: Self-pay | Admitting: Family Medicine

## 2024-07-06 ENCOUNTER — Encounter: Payer: Self-pay | Admitting: Family Medicine

## 2024-07-06 VITALS — BP 124/68 | HR 90 | Ht 69.0 in | Wt 156.4 lb

## 2024-07-06 DIAGNOSIS — N400 Enlarged prostate without lower urinary tract symptoms: Secondary | ICD-10-CM

## 2024-07-06 DIAGNOSIS — I129 Hypertensive chronic kidney disease with stage 1 through stage 4 chronic kidney disease, or unspecified chronic kidney disease: Secondary | ICD-10-CM

## 2024-07-06 DIAGNOSIS — Z1211 Encounter for screening for malignant neoplasm of colon: Secondary | ICD-10-CM

## 2024-07-06 DIAGNOSIS — F5101 Primary insomnia: Secondary | ICD-10-CM

## 2024-07-06 DIAGNOSIS — I10 Essential (primary) hypertension: Secondary | ICD-10-CM

## 2024-07-06 DIAGNOSIS — N182 Chronic kidney disease, stage 2 (mild): Secondary | ICD-10-CM

## 2024-07-06 DIAGNOSIS — E782 Mixed hyperlipidemia: Secondary | ICD-10-CM

## 2024-07-06 DIAGNOSIS — F3342 Major depressive disorder, recurrent, in full remission: Secondary | ICD-10-CM

## 2024-07-06 DIAGNOSIS — Z0000 Encounter for general adult medical examination without abnormal findings: Secondary | ICD-10-CM

## 2024-07-06 MED ORDER — AMLODIPINE BESYLATE 10 MG PO TABS: 10.0000 mg | ORAL_TABLET | Freq: Every day | ORAL | 3 refills | Status: AC

## 2024-07-06 MED ORDER — TRAZODONE HCL 100 MG PO TABS: 100.0000 mg | ORAL_TABLET | Freq: Every day | ORAL | 3 refills | Status: AC

## 2024-07-06 NOTE — Patient Instructions (Addendum)
 Thank you for coming to the office today.  Refilled medications  Contact if you need Alprazolam  Xanax  refilled here or at the pharmacy.  Consider Shingles vaccine Shingrix 2 doses 2-6 months apart, pharmacy for best price.  Recent Labs    07/03/24 0943  HGBA1C 5.2   Lipid Panel     Component Value Date/Time   CHOL 234 (H) 07/03/2024 0943   TRIG 153 (H) 07/03/2024 0943   HDL 78 07/03/2024 0943   CHOLHDL 3.0 07/03/2024 0943   CHOLHDL 3.0 06/28/2023 1020   VLDL 19 05/30/2019 0641   LDLCALC 129 (H) 07/03/2024 0943   LDLCALC 161 (H) 06/28/2023 1020   LABVLDL 27 07/03/2024 0943    Please schedule a Follow-up Appointment to: Return in about 6 months (around 01/03/2025) for 6 month follow-up HTN, Anxiety, Insomnia, refills.  If you have any other questions or concerns, please feel free to call the office or send a message through MyChart. You may also schedule an earlier appointment if necessary.  Additionally, you may be receiving a survey about your experience at our office within a few days to 1 week by e-mail or mail. We value your feedback.  Marsa Officer, DO [facility], [place]

## 2024-07-06 NOTE — Progress Notes (Signed)
 "  Subjective:    Patient ID: Brett Huber, male    DOB: [birth year], 59 y.o.   MRN: [id number]  Brett Huber is a 59 y.o. male presenting on 07/06/2024 for Annual Exam   HPI  Discussed the use of AI scribe software for clinical note transcription with the patient, who gave verbal consent to proceed.  History of Present Illness   Brett Huber is a 59 year old male who presents for an annual physical exam.  Glycemic control - Fasting glucose 100 mg/dL, improved from previous values of 112 and 107 mg/dL - Hemoglobin J8r 4.7% - Attributes improvement to dietary habits, including almond consumption  Hyperlipidemia - LDL cholesterol 129 mg/dL, improved from previous values in the 150s - Not currently taking cholesterol-lowering medication  Upper respiratory symptoms - Recent exposure to mold in workplace ice machine and consumption of water from it - Mild chest and nasal symptoms present - No gastrointestinal symptoms - Recent exposure to ill daughter earlier in the week   Alcohol Dependence uncomplicated Resumed drinking, small amount 2-3 drinks 1-2 times a week now, not every day    CHRONIC HTN CKD II Improving hydration and lifestyle - Today patient reports doing well - Home BP improved - Drinks approximately 100 ounces of water daily to support kidney health Current Meds - Amlodipine  10mg  daily Reports good compliance, took meds today. Tolerating well, w/o complaints. Denies CP, dyspnea, HA, edema, dizziness / lightheadedness   INSOMNIA, Chronic Anxiety See prior notes, has been stable on current med management now. He had been doing very well. Denies depression. Anxiety has been controlled. Sleep has improved on medicines - taking Trazodone  100mg  nightly, needs refill - Taking Xanax  1mg  = one whole 1mg  pill 4-5 days a week before bed for insomnia on work days, and occasionally takes a half pill for a few nights before bed if weekend     Health  Maintenance:   Cologuard, last done 05/22/19, negative, repeat due now 3+ years. Ordered today.   PSA 0.5 negative.   Future Shingrix at pharmacy   Updated Flu Prevnar 20     07/06/2024    9:23 AM 04/13/2024   10:53 AM 06/28/2023    9:27 AM  Depression screen PHQ 2/9  Decreased Interest 0 0 0  Down, Depressed, Hopeless 0 0 0  PHQ - 2 Score 0 0 0  Altered sleeping 0 1 1  Tired, decreased energy 1 1 1   Change in appetite 0 0 0  Feeling bad or failure about yourself  0 0 0  Trouble concentrating 0 0 0  Moving slowly or fidgety/restless 0 0 0  Suicidal thoughts 0 0 0  PHQ-9 Score 1 2  2    Difficult doing work/chores  Not difficult at all      Data saved with a previous flowsheet row definition       07/06/2024    9:23 AM 04/13/2024   10:53 AM 06/28/2023    9:27 AM 05/14/2022   10:52 AM  GAD 7 : Generalized Anxiety Score  Nervous, Anxious, on Edge 1 0 0 1  Control/stop worrying 0 0 0 0  Worry too much - different things 0 0 0 0  Trouble relaxing 1 0 0 1  Restless 0 0 0 0  Easily annoyed or irritable 0 0 0 1  Afraid - awful might happen 0 0 0 0  Total GAD 7 Score 2 0 0 3  Anxiety Difficulty  Not difficult at all     Past Medical History:  Diagnosis Date   Anxiety    Chronic pain    DDD (degenerative disc disease)    Hyperlipidemia    Hypertension    Insomnia    Past Surgical History:  Procedure Laterality Date   CERVICAL FUSION  2004   CHOLECYSTECTOMY  2007   neck and disc replacement     SHOULDER ARTHROSCOPY WITH ROTATOR CUFF REPAIR AND SUBACROMIAL DECOMPRESSION Left 01/29/2013   Procedure: LEFT SHOULDER ARTHROSCOPY WITH ROTATOR CUFF REPAIR AND SUBACROMIAL DECOMPRESSION DISTAL CLAVICLE RESECTION;  Surgeon: Eva Elsie Herring, MD;  Location: [facility];  Service: Orthopedics;  Laterality: Left;   Social History   Socioeconomic History   Marital status: Married    Spouse name: Not on file   Number of children: Not on file   Years of  education: Not on file   Highest education level: Not on file  Occupational History   Not on file  Tobacco Use   Smoking status: Never   Smokeless tobacco: Current    Types: Chew  Substance and Sexual Activity   Alcohol use: Yes    Comment: occ   Drug use: No   Sexual activity: Not on file  Other Topics Concern   Not on file  Social History Narrative   Not on file   Social Drivers of Health   Tobacco Use: High Risk (07/06/2024)   Patient History    Smoking Tobacco Use: Never    Smokeless Tobacco Use: Current    Passive Exposure: Not on file  Financial Resource Strain: Not on file  Food Insecurity: Not on file  Transportation Needs: Not on file  Physical Activity: Not on file  Stress: Not on file  Social Connections: Not on file  Intimate Partner Violence: Not on file  Depression (PHQ2-9): Low Risk (07/06/2024)   Depression (PHQ2-9)    PHQ-2 Score: 1  Alcohol Screen: Not on file  Housing: Not on file  Utilities: Not on file  Health Literacy: Not on file   Family History  Problem Relation Age of Onset   Diabetes Mother    Stroke Father    Prostate cancer Neg Hx    Kidney cancer Neg Hx    Bladder Cancer Neg Hx    Current Outpatient Medications on File Prior to Visit  Medication Sig   acetaminophen  (TYLENOL ) 325 MG tablet Take 2 tablets (650 mg total) by mouth every 6 (six) hours as needed for mild pain, moderate pain or headache.   ALPRAZolam  (XANAX ) 1 MG tablet TAKE 1 TABLET BY MOUTH AT BEDTIME AS NEEDED FOR ANXIETY   Multiple Vitamin (MULTIVITAMIN WITH MINERALS) TABS tablet Take 1 tablet by mouth daily.   triamcinolone  ointment (KENALOG ) 0.5 % Apply 1 application topically 2 (two) times daily. 1-2 weeks, for hands.   No current facility-administered medications on file prior to visit.    Review of Systems  Constitutional:  Negative for activity change, appetite change, chills, diaphoresis, fatigue and fever.  HENT:  Negative for congestion and hearing loss.    Eyes:  Negative for visual disturbance.  Respiratory:  Negative for cough, chest tightness, shortness of breath and wheezing.   Cardiovascular:  Negative for chest pain, palpitations and leg swelling.  Gastrointestinal:  Negative for abdominal pain, constipation, diarrhea, nausea and vomiting.  Genitourinary:  Negative for dysuria, frequency and hematuria.  Musculoskeletal:  Negative for arthralgias and neck pain.  Skin:  Negative for rash.  Neurological:  Negative for dizziness, weakness, light-headedness, numbness and headaches.  Hematological:  Negative for adenopathy.  Psychiatric/Behavioral:  Negative for behavioral problems, dysphoric mood and sleep disturbance.    Per HPI unless specifically indicated above     Objective:    BP 124/68 (BP Location: Left Arm, Patient Position: Sitting, Cuff Size: Normal)   Pulse 90   Ht 5' 9 (1.753 m)   Wt 156 lb 6 oz (70.9 kg)   SpO2 94%   BMI 23.09 kg/m   Wt Readings from Last 3 Encounters:  07/06/24 156 lb 6 oz (70.9 kg)  04/13/24 159 lb 6.4 oz (72.3 kg)  06/28/23 159 lb (72.1 kg)    Physical Exam Vitals and nursing note reviewed.  Constitutional:      General: He is not in acute distress.    Appearance: He is well-developed. He is not diaphoretic.     Comments: Well-appearing, comfortable, cooperative  HENT:     Head: Normocephalic and atraumatic.  Eyes:     General:        Right eye: No discharge.        Left eye: No discharge.     Conjunctiva/sclera: Conjunctivae normal.     Pupils: Pupils are equal, round, and reactive to light.  Neck:     Thyroid: No thyromegaly.     Vascular: No carotid bruit.  Cardiovascular:     Rate and Rhythm: Normal rate and regular rhythm.     Pulses: Normal pulses.     Heart sounds: Normal heart sounds. No murmur heard. Pulmonary:     Effort: Pulmonary effort is normal. No respiratory distress.     Breath sounds: Normal breath sounds. No wheezing or rales.  Abdominal:     General: Bowel  sounds are normal. There is no distension.     Palpations: Abdomen is soft. There is no mass.     Tenderness: There is no abdominal tenderness.  Musculoskeletal:        General: No tenderness. Normal range of motion.     Cervical back: Normal range of motion and neck supple.     Right lower leg: No edema.     Left lower leg: No edema.     Comments: Upper / Lower Extremities: - Normal muscle tone, strength bilateral upper extremities 5/5, lower extremities 5/5  Lymphadenopathy:     Cervical: No cervical adenopathy.  Skin:    General: Skin is warm and dry.     Findings: No erythema or rash.  Neurological:     Mental Status: He is alert and oriented to person, place, and time.     Comments: Distal sensation intact to light touch all extremities  Psychiatric:        Mood and Affect: Mood normal.        Behavior: Behavior normal.        Thought Content: Thought content normal.     Comments: Well groomed, good eye contact, normal speech and thoughts     I have personally reviewed the radiology report from 07/08/23 on [facility].  ADDENDUM REPORT: 07/16/2023 18:57   EXAM: OVER-READ INTERPRETATION CT CHEST   The following report is an over-read performed by radiologist Dr. Norman Hopper of [facility] on 07/16/2023. This over-read does not include interpretation of cardiac or coronary anatomy or pathology. The coronary calcium score interpretation by the cardiologist is attached.   COMPARISON:  Chest radiograph dated 11/29/2016.   FINDINGS: Cardiovascular: Vascular calcifications are seen in the thoracic aorta.  Normal heart size. No pericardial effusion.   Mediastinum/Nodes: No enlarged mediastinal lymph nodes. The visible trachea and esophagus demonstrate no significant findings.   Lungs/Pleura: The visible lungs are clear. No pleural effusion.   Upper Abdomen: No acute abnormality.   Musculoskeletal: No chest wall mass or suspicious bone lesions identified.    IMPRESSION: 1. No acute findings in the chest.   Aortic Atherosclerosis (ICD10-I70.0).     Electronically Signed   By: Norman Hopper M.D.   On: 07/16/2023 18:57    Addended by Hopper Norman SQUIBB, MD on 07/16/2023  6:59 PM    Study Result  Narrative & Impression  CLINICAL DATA:  Risk stratification   EXAM: Coronary Calcium Score   TECHNIQUE: The patient was scanned on a Siemens Somatom scanner. Axial non-contrast 3 mm slices were carried out through the heart. The data set was analyzed on a dedicated work station and scored using the Agatson method.   FINDINGS: Non-cardiac: See separate report from [facility].   Ascending Aorta: Normal size   Pericardium: Normal   Coronary arteries: Normal origin of left and right coronary arteries. Distribution of arterial calcifications if present, as noted below;   LM 0   LAD 63.7   LCx 0   RCA 0   Total 63.7   IMPRESSION AND RECOMMENDATION: 1. Coronary calcium score of 63.7. This was 63rd percentile for age and sex matched control.   2. CAC 1-99 in LAD. CAC-DRS A1/N1.   3. Continue heart healthy lifestyle and risk factor modification.   Electronically Signed: By: Redell Cave M.D. On: 07/08/2023 15:14     Results for orders placed or performed in visit on 04/13/24  Lipid panel   Collection Time: 07/03/24  9:43 AM  Result Value Ref Range   Cholesterol, Total 234 (H) 100 - 199 mg/dL   Triglycerides 846 (H) 0 - 149 mg/dL   HDL 78 >60 mg/dL   VLDL Cholesterol Cal 27 5 - 40 mg/dL   LDL Chol Calc (NIH) 870 (H) 0 - 99 mg/dL   Chol/HDL Ratio 3.0 0.0 - 5.0 ratio  Hemoglobin A1c   Collection Time: 07/03/24  9:43 AM  Result Value Ref Range   Hgb A1c MFr Bld 5.2 4.8 - 5.6 %   Est. average glucose Bld gHb Est-mCnc 103 mg/dL  CBC with Differential/Platelet   Collection Time: 07/03/24  9:43 AM  Result Value Ref Range   WBC 6.8 3.4 - 10.8 x10E3/uL   RBC 5.00 4.14 - 5.80 x10E6/uL   Hemoglobin 15.6 13.0  - 17.7 g/dL   Hematocrit 54.5 62.4 - 51.0 %   MCV 91 79 - 97 fL   MCH 31.2 26.6 - 33.0 pg   MCHC 34.4 31.5 - 35.7 g/dL   RDW 87.1 88.3 - 84.5 %   Platelets 252 150 - 450 x10E3/uL   Neutrophils 70 Not Estab. %   Lymphs 17 Not Estab. %   Monocytes 11 Not Estab. %   Eos 1 Not Estab. %   Basos 1 Not Estab. %   Neutrophils Absolute 4.7 1.4 - 7.0 x10E3/uL   Lymphocytes Absolute 1.2 0.7 - 3.1 x10E3/uL   Monocytes Absolute 0.7 0.1 - 0.9 x10E3/uL   EOS (ABSOLUTE) 0.1 0.0 - 0.4 x10E3/uL   Basophils Absolute 0.0 0.0 - 0.2 x10E3/uL   Immature Granulocytes 0 Not Estab. %   Immature Grans (Abs) 0.0 0.0 - 0.1 x10E3/uL  PSA   Collection Time: 07/03/24  9:43 AM  Result Value Ref  Range   Prostate Specific Ag, Serum 0.5 0.0 - 4.0 ng/mL  TSH   Collection Time: 07/03/24  9:43 AM  Result Value Ref Range   TSH 1.530 0.450 - 4.500 uIU/mL  Comprehensive metabolic panel with GFR   Collection Time: 07/03/24  9:43 AM  Result Value Ref Range   Glucose 100 (H) 70 - 99 mg/dL   BUN 16 8 - 27 mg/dL   Creatinine, Ser 8.79 0.76 - 1.27 mg/dL   eGFR 69 >40 fO/fpw/8.26   BUN/Creatinine Ratio 13 10 - 24   Sodium 139 134 - 144 mmol/L   Potassium 4.1 3.5 - 5.2 mmol/L   Chloride 98 96 - 106 mmol/L   CO2 26 20 - 29 mmol/L   Calcium 9.9 8.6 - 10.2 mg/dL   Total Protein 7.1 6.0 - 8.5 g/dL   Albumin 4.9 3.8 - 4.9 g/dL   Globulin, Total 2.2 1.5 - 4.5 g/dL   Bilirubin Total 1.2 0.0 - 1.2 mg/dL   Alkaline Phosphatase 55 47 - 123 IU/L   AST 17 0 - 40 IU/L   ALT 15 0 - 44 IU/L      Assessment & Plan:   Problem List Items Addressed This Visit     Benign hypertension with CKD (chronic kidney disease), stage II   Relevant Medications   amLODipine  (NORVASC ) 10 MG tablet   BPH (benign prostatic hyperplasia)   Hyperlipidemia   Relevant Medications   amLODipine  (NORVASC ) 10 MG tablet   Primary insomnia   Relevant Medications   traZODone  (DESYREL ) 100 MG tablet   Recurrent major depression in full remission    Relevant Medications   traZODone  (DESYREL ) 100 MG tablet   Other Visit Diagnoses       Annual physical exam    -  Primary     Screening for colon cancer       Relevant Orders   Cologuard     Essential hypertension       Relevant Medications   amLODipine  (NORVASC ) 10 MG tablet       Updated Health Maintenance information Reviewed recent lab results with patient Encouraged improvement to lifestyle with diet and exercise Goal of weight loss  Adult Wellness Visit Annual wellness visit completed. Vaccinations current. Shingles vaccine discussed. Colon cancer screening kit reordered. Heart scan showed minor arterial buildup. Labs: normal kidney/liver function, slightly elevated glucose, normal A1c, improving cholesterol, normal blood counts/PSA. - Ordered new Cologuard kit for colon cancer screening. - Consider shingles vaccine at pharmacy. - Continue current lifestyle and dietary habits to manage cholesterol and glucose levels.  Hypertensive chronic kidney disease stage 2 Blood pressure controlled with amlodipine . Kidney function normal. - Continue amlodipine  10 mg daily.  Mixed hyperlipidemia Cholesterol improving, LDL 129 mg/dL, triglycerides borderline, HDL good. 10-year heart attack/stroke risk 8%. - Continue current dietary habits to manage cholesterol. - Monitor cholesterol levels regularly.  Primary insomnia Major Depression in full remission Sleep managed with trazodone  100 mg nightly. - Continue Alprazolam  1mg  nightly - Continue trazodone  100 mg nightly. - Added future refills for trazodone .         Orders Placed This Encounter  Procedures   Cologuard    Meds ordered this encounter  Medications   amLODipine  (NORVASC ) 10 MG tablet    Sig: Take 1 tablet (10 mg total) by mouth daily.    Dispense:  90 tablet    Refill:  3    Add future refills   traZODone  (DESYREL ) 100 MG tablet  Sig: Take 1 tablet (100 mg total) by mouth at bedtime.    Dispense:  90  tablet    Refill:  3    Add future refills     Follow up plan: Return in about 6 months (around 01/03/2025) for 6 month follow-up HTN, Insomnia, refills.  Marsa Officer, DO [facility] 07/06/2024, 10:03 AM  "

## 2025-01-11 ENCOUNTER — Ambulatory Visit: Payer: Self-pay | Admitting: Family Medicine
